# Patient Record
Sex: Male | Born: 1940 | Race: White | Hispanic: No | Smoking: Former smoker
Health system: Southern US, Community
[De-identification: ages and names within clinical notes are randomized; demographics above are authoritative.]

## PROBLEM LIST (undated history)

## (undated) DIAGNOSIS — G4700 Insomnia, unspecified: Secondary | ICD-10-CM

## (undated) DIAGNOSIS — J449 Chronic obstructive pulmonary disease, unspecified: Secondary | ICD-10-CM

## (undated) DIAGNOSIS — K219 Gastro-esophageal reflux disease without esophagitis: Secondary | ICD-10-CM

## (undated) DIAGNOSIS — Z720 Tobacco use: Secondary | ICD-10-CM

## (undated) DIAGNOSIS — E785 Hyperlipidemia, unspecified: Secondary | ICD-10-CM

## (undated) DIAGNOSIS — K635 Polyp of colon: Secondary | ICD-10-CM

## (undated) DIAGNOSIS — K631 Perforation of intestine (nontraumatic): Secondary | ICD-10-CM

## (undated) DIAGNOSIS — I951 Orthostatic hypotension: Secondary | ICD-10-CM

## (undated) DIAGNOSIS — R410 Disorientation, unspecified: Secondary | ICD-10-CM

## (undated) DIAGNOSIS — G20 Parkinson's disease: Secondary | ICD-10-CM

## (undated) DIAGNOSIS — J61 Pneumoconiosis due to asbestos and other mineral fibers: Secondary | ICD-10-CM

## (undated) HISTORY — DX: Disorientation, unspecified: R41.0

## (undated) HISTORY — DX: Tobacco use: Z72.0

## (undated) HISTORY — DX: Gastro-esophageal reflux disease without esophagitis: K21.9

## (undated) HISTORY — DX: Parkinson's disease: G20

## (undated) HISTORY — PX: COLONOSCOPY: SHX174

## (undated) HISTORY — PX: WRIST SURGERY: SHX841

## (undated) HISTORY — PX: DUPUYTREN CONTRACTURE RELEASE: SHX1478

## (undated) HISTORY — DX: Polyp of colon: K63.5

## (undated) HISTORY — DX: Chronic obstructive pulmonary disease, unspecified: J44.9

## (undated) HISTORY — PX: CATARACT EXTRACTION: SUR2

## (undated) HISTORY — DX: Hyperlipidemia, unspecified: E78.5

## (undated) HISTORY — DX: Pneumoconiosis due to asbestos and other mineral fibers: J61

## (undated) HISTORY — DX: Orthostatic hypotension: I95.1

---

## 1968-06-08 HISTORY — PX: NECK SURGERY: SHX720

## 1998-10-22 ENCOUNTER — Emergency Department (HOSPITAL_COMMUNITY): Admission: EM | Admit: 1998-10-22 | Discharge: 1998-10-22 | Payer: Self-pay | Admitting: Emergency Medicine

## 1998-10-22 ENCOUNTER — Encounter: Payer: Self-pay | Admitting: Emergency Medicine

## 1998-10-23 ENCOUNTER — Inpatient Hospital Stay (HOSPITAL_COMMUNITY): Admission: EM | Admit: 1998-10-23 | Discharge: 1998-10-25 | Payer: Self-pay | Admitting: Emergency Medicine

## 1998-10-23 ENCOUNTER — Encounter: Payer: Self-pay | Admitting: Internal Medicine

## 1998-10-23 ENCOUNTER — Encounter: Payer: Self-pay | Admitting: *Deleted

## 1998-11-08 ENCOUNTER — Encounter: Admission: RE | Admit: 1998-11-08 | Discharge: 1998-11-08 | Payer: Self-pay | Admitting: Internal Medicine

## 1998-12-24 ENCOUNTER — Encounter: Admission: RE | Admit: 1998-12-24 | Discharge: 1998-12-24 | Payer: Self-pay | Admitting: Internal Medicine

## 2001-07-01 ENCOUNTER — Encounter: Admission: RE | Admit: 2001-07-01 | Discharge: 2001-07-01 | Payer: Self-pay

## 2001-09-20 ENCOUNTER — Encounter: Admission: RE | Admit: 2001-09-20 | Discharge: 2001-09-20 | Payer: Self-pay

## 2002-03-03 ENCOUNTER — Encounter: Admission: RE | Admit: 2002-03-03 | Discharge: 2002-03-03 | Payer: Self-pay | Admitting: Internal Medicine

## 2002-05-01 ENCOUNTER — Encounter: Payer: Self-pay | Admitting: Internal Medicine

## 2002-05-02 ENCOUNTER — Ambulatory Visit (HOSPITAL_COMMUNITY): Admission: RE | Admit: 2002-05-02 | Discharge: 2002-05-02 | Payer: Self-pay | Admitting: Internal Medicine

## 2002-05-02 ENCOUNTER — Encounter: Payer: Self-pay | Admitting: Internal Medicine

## 2002-05-05 ENCOUNTER — Encounter (INDEPENDENT_AMBULATORY_CARE_PROVIDER_SITE_OTHER): Payer: Self-pay | Admitting: *Deleted

## 2002-05-05 ENCOUNTER — Inpatient Hospital Stay (HOSPITAL_COMMUNITY): Admission: RE | Admit: 2002-05-05 | Discharge: 2002-05-11 | Payer: Self-pay | Admitting: Internal Medicine

## 2002-05-05 ENCOUNTER — Encounter: Payer: Self-pay | Admitting: Internal Medicine

## 2002-05-09 ENCOUNTER — Encounter: Payer: Self-pay | Admitting: Internal Medicine

## 2002-05-11 ENCOUNTER — Encounter: Payer: Self-pay | Admitting: Internal Medicine

## 2002-05-11 ENCOUNTER — Encounter (INDEPENDENT_AMBULATORY_CARE_PROVIDER_SITE_OTHER): Payer: Self-pay | Admitting: *Deleted

## 2002-05-22 ENCOUNTER — Encounter: Payer: Self-pay | Admitting: Internal Medicine

## 2002-05-22 ENCOUNTER — Encounter (INDEPENDENT_AMBULATORY_CARE_PROVIDER_SITE_OTHER): Payer: Self-pay | Admitting: *Deleted

## 2002-05-22 ENCOUNTER — Ambulatory Visit (HOSPITAL_COMMUNITY): Admission: RE | Admit: 2002-05-22 | Discharge: 2002-05-22 | Payer: Self-pay | Admitting: Internal Medicine

## 2002-05-23 ENCOUNTER — Encounter: Admission: RE | Admit: 2002-05-23 | Discharge: 2002-05-23 | Payer: Self-pay | Admitting: Internal Medicine

## 2002-06-22 ENCOUNTER — Ambulatory Visit (HOSPITAL_COMMUNITY): Admission: RE | Admit: 2002-06-22 | Discharge: 2002-06-22 | Payer: Self-pay | Admitting: Internal Medicine

## 2002-06-22 ENCOUNTER — Encounter: Payer: Self-pay | Admitting: Internal Medicine

## 2002-08-22 ENCOUNTER — Encounter: Payer: Self-pay | Admitting: Internal Medicine

## 2002-08-22 ENCOUNTER — Ambulatory Visit (HOSPITAL_COMMUNITY): Admission: RE | Admit: 2002-08-22 | Discharge: 2002-08-22 | Payer: Self-pay | Admitting: Internal Medicine

## 2003-04-13 ENCOUNTER — Encounter: Payer: Self-pay | Admitting: Internal Medicine

## 2003-04-13 ENCOUNTER — Ambulatory Visit (HOSPITAL_COMMUNITY): Admission: RE | Admit: 2003-04-13 | Discharge: 2003-04-13 | Payer: Self-pay | Admitting: Internal Medicine

## 2003-04-25 ENCOUNTER — Encounter: Payer: Self-pay | Admitting: Internal Medicine

## 2004-05-05 ENCOUNTER — Ambulatory Visit: Payer: Self-pay | Admitting: Internal Medicine

## 2004-10-06 ENCOUNTER — Ambulatory Visit: Payer: Self-pay | Admitting: Internal Medicine

## 2004-10-15 ENCOUNTER — Ambulatory Visit: Payer: Self-pay | Admitting: Internal Medicine

## 2005-01-26 ENCOUNTER — Ambulatory Visit: Payer: Self-pay | Admitting: Internal Medicine

## 2005-01-29 ENCOUNTER — Emergency Department (HOSPITAL_COMMUNITY): Admission: EM | Admit: 2005-01-29 | Discharge: 2005-01-29 | Payer: Self-pay | Admitting: Emergency Medicine

## 2005-02-03 ENCOUNTER — Ambulatory Visit: Payer: Self-pay | Admitting: Internal Medicine

## 2005-05-20 ENCOUNTER — Ambulatory Visit: Payer: Self-pay | Admitting: Internal Medicine

## 2005-06-30 ENCOUNTER — Ambulatory Visit: Payer: Self-pay | Admitting: Internal Medicine

## 2005-07-03 ENCOUNTER — Ambulatory Visit: Payer: Self-pay | Admitting: Internal Medicine

## 2005-12-30 ENCOUNTER — Ambulatory Visit: Payer: Self-pay | Admitting: Hospitalist

## 2005-12-30 ENCOUNTER — Ambulatory Visit (HOSPITAL_COMMUNITY): Admission: RE | Admit: 2005-12-30 | Discharge: 2005-12-30 | Payer: Self-pay | Admitting: Hospitalist

## 2006-01-12 ENCOUNTER — Encounter (INDEPENDENT_AMBULATORY_CARE_PROVIDER_SITE_OTHER): Payer: Self-pay | Admitting: Internal Medicine

## 2006-01-15 ENCOUNTER — Ambulatory Visit: Payer: Self-pay | Admitting: Internal Medicine

## 2006-02-15 ENCOUNTER — Ambulatory Visit: Payer: Self-pay | Admitting: Internal Medicine

## 2006-03-03 ENCOUNTER — Encounter: Payer: Self-pay | Admitting: Internal Medicine

## 2006-03-03 ENCOUNTER — Ambulatory Visit (HOSPITAL_COMMUNITY): Admission: RE | Admit: 2006-03-03 | Discharge: 2006-03-03 | Payer: Self-pay | Admitting: Internal Medicine

## 2006-04-09 DIAGNOSIS — E785 Hyperlipidemia, unspecified: Secondary | ICD-10-CM | POA: Insufficient documentation

## 2006-04-09 DIAGNOSIS — K222 Esophageal obstruction: Secondary | ICD-10-CM

## 2006-04-09 DIAGNOSIS — D126 Benign neoplasm of colon, unspecified: Secondary | ICD-10-CM | POA: Insufficient documentation

## 2006-04-09 DIAGNOSIS — K219 Gastro-esophageal reflux disease without esophagitis: Secondary | ICD-10-CM

## 2006-04-09 DIAGNOSIS — Z87891 Personal history of nicotine dependence: Secondary | ICD-10-CM

## 2006-04-09 DIAGNOSIS — J61 Pneumoconiosis due to asbestos and other mineral fibers: Secondary | ICD-10-CM | POA: Insufficient documentation

## 2006-04-09 DIAGNOSIS — F1011 Alcohol abuse, in remission: Secondary | ICD-10-CM | POA: Insufficient documentation

## 2006-04-19 ENCOUNTER — Ambulatory Visit: Payer: Self-pay | Admitting: Internal Medicine

## 2006-04-19 ENCOUNTER — Encounter (INDEPENDENT_AMBULATORY_CARE_PROVIDER_SITE_OTHER): Payer: Self-pay | Admitting: Internal Medicine

## 2006-06-19 DIAGNOSIS — K631 Perforation of intestine (nontraumatic): Secondary | ICD-10-CM

## 2006-06-19 HISTORY — DX: Perforation of intestine (nontraumatic): K63.1

## 2006-08-25 ENCOUNTER — Ambulatory Visit: Payer: Self-pay | Admitting: Internal Medicine

## 2007-03-03 ENCOUNTER — Emergency Department (HOSPITAL_COMMUNITY): Admission: EM | Admit: 2007-03-03 | Discharge: 2007-03-03 | Payer: Self-pay | Admitting: Emergency Medicine

## 2007-05-16 ENCOUNTER — Ambulatory Visit: Payer: Self-pay | Admitting: Infectious Diseases

## 2007-12-26 ENCOUNTER — Encounter (INDEPENDENT_AMBULATORY_CARE_PROVIDER_SITE_OTHER): Payer: Self-pay | Admitting: Internal Medicine

## 2007-12-26 ENCOUNTER — Ambulatory Visit (HOSPITAL_COMMUNITY): Admission: RE | Admit: 2007-12-26 | Discharge: 2007-12-26 | Payer: Self-pay | Admitting: Internal Medicine

## 2007-12-26 ENCOUNTER — Ambulatory Visit: Payer: Self-pay | Admitting: Internal Medicine

## 2007-12-26 DIAGNOSIS — J029 Acute pharyngitis, unspecified: Secondary | ICD-10-CM

## 2007-12-26 LAB — CONVERTED CEMR LAB: Streptococcus, Group A Screen (Direct): NEGATIVE

## 2008-01-02 ENCOUNTER — Ambulatory Visit: Payer: Self-pay | Admitting: Internal Medicine

## 2008-01-26 ENCOUNTER — Ambulatory Visit: Payer: Self-pay | Admitting: *Deleted

## 2008-01-26 ENCOUNTER — Encounter: Payer: Self-pay | Admitting: Internal Medicine

## 2008-01-26 LAB — CONVERTED CEMR LAB
Cholesterol: 174 mg/dL (ref 0–200)
HDL: 49 mg/dL (ref >39)
LDL Cholesterol: 48 mg/dL (ref 0–99)
Total CHOL/HDL Ratio: 3.6
Triglycerides: 386 mg/dL — ABNORMAL HIGH (ref <150)
VLDL: 77 mg/dL — ABNORMAL HIGH (ref 0–40)

## 2008-02-06 ENCOUNTER — Telehealth: Payer: Self-pay | Admitting: Internal Medicine

## 2008-03-07 ENCOUNTER — Ambulatory Visit: Payer: Self-pay | Admitting: Internal Medicine

## 2008-04-18 ENCOUNTER — Encounter (INDEPENDENT_AMBULATORY_CARE_PROVIDER_SITE_OTHER): Payer: Self-pay | Admitting: Internal Medicine

## 2008-06-18 ENCOUNTER — Encounter: Payer: Self-pay | Admitting: Internal Medicine

## 2008-06-18 ENCOUNTER — Ambulatory Visit: Payer: Self-pay | Admitting: Internal Medicine

## 2008-06-18 DIAGNOSIS — M109 Gout, unspecified: Secondary | ICD-10-CM | POA: Insufficient documentation

## 2008-06-25 LAB — CONVERTED CEMR LAB
ALT: 17 units/L (ref 0–53)
AST: 19 units/L (ref 0–37)
Albumin: 4 g/dL (ref 3.5–5.2)
Alkaline Phosphatase: 69 units/L (ref 39–117)
BUN: 12 mg/dL (ref 6–23)
Basophils Absolute: 0 10*3/uL (ref 0.0–0.1)
Basophils Relative: 0 % (ref 0–1)
CO2: 24 meq/L (ref 19–32)
Calcium: 9.2 mg/dL (ref 8.4–10.5)
Chloride: 104 meq/L (ref 96–112)
Creatinine, Ser: 0.94 mg/dL (ref 0.40–1.50)
Crystals, Fluid: NONE SEEN
Eosinophils Absolute: 0.1 10*3/uL (ref 0.0–0.7)
Eosinophils Relative: 1 % (ref 0–5)
Glucose, Bld: 96 mg/dL (ref 70–99)
HCT: 48.8 % (ref 39.0–52.0)
Hemoglobin: 16.3 g/dL (ref 13.0–17.0)
Lymphocytes Relative: 20 % (ref 12–46)
Lymphs Abs: 1.7 10*3/uL (ref 0.7–4.0)
MCHC: 33.4 g/dL (ref 30.0–36.0)
MCV: 98.4 fL (ref 78.0–100.0)
Monocytes Absolute: 0.7 10*3/uL (ref 0.1–1.0)
Monocytes Relative: 9 % (ref 3–12)
Neutro Abs: 5.9 10*3/uL (ref 1.7–7.7)
Neutrophils Relative %: 70 % (ref 43–77)
Platelets: 222 10*3/uL (ref 150–400)
Potassium: 4.7 meq/L (ref 3.5–5.3)
RBC: 4.96 M/uL (ref 4.22–5.81)
RDW: 13.3 % (ref 11.5–15.5)
Sodium: 141 meq/L (ref 135–145)
Total Bilirubin: 0.6 mg/dL (ref 0.3–1.2)
Total Protein: 6.5 g/dL (ref 6.0–8.3)
WBC: 8.4 10*3/uL (ref 4.0–10.5)

## 2008-06-28 ENCOUNTER — Telehealth: Payer: Self-pay | Admitting: *Deleted

## 2008-06-29 ENCOUNTER — Ambulatory Visit: Payer: Self-pay | Admitting: Internal Medicine

## 2008-07-02 ENCOUNTER — Encounter: Payer: Self-pay | Admitting: Internal Medicine

## 2008-07-02 ENCOUNTER — Ambulatory Visit: Payer: Self-pay | Admitting: Infectious Disease

## 2008-07-03 LAB — CONVERTED CEMR LAB: Streptococcus, Group A Screen (Direct): NEGATIVE

## 2008-07-05 ENCOUNTER — Telehealth: Payer: Self-pay | Admitting: Internal Medicine

## 2008-08-16 ENCOUNTER — Ambulatory Visit: Payer: Self-pay | Admitting: Internal Medicine

## 2008-08-16 DIAGNOSIS — R21 Rash and other nonspecific skin eruption: Secondary | ICD-10-CM

## 2008-08-30 ENCOUNTER — Ambulatory Visit: Payer: Self-pay | Admitting: *Deleted

## 2008-08-30 ENCOUNTER — Encounter (INDEPENDENT_AMBULATORY_CARE_PROVIDER_SITE_OTHER): Payer: Self-pay | Admitting: Internal Medicine

## 2008-08-30 LAB — CONVERTED CEMR LAB
Cholesterol: 192 mg/dL (ref 0–200)
HDL: 59 mg/dL (ref >39)
LDL Cholesterol: 97 mg/dL (ref 0–99)
TSH: 1.512 microintl units/mL (ref 0.350–4.500)
Total CHOL/HDL Ratio: 3.3
Triglycerides: 179 mg/dL — ABNORMAL HIGH (ref <150)
VLDL: 36 mg/dL (ref 0–40)

## 2008-11-08 ENCOUNTER — Ambulatory Visit: Payer: Self-pay | Admitting: Internal Medicine

## 2008-11-27 ENCOUNTER — Encounter: Payer: Self-pay | Admitting: Internal Medicine

## 2008-11-27 ENCOUNTER — Ambulatory Visit: Payer: Self-pay | Admitting: Internal Medicine

## 2008-11-28 ENCOUNTER — Encounter: Payer: Self-pay | Admitting: Internal Medicine

## 2008-12-01 ENCOUNTER — Encounter: Payer: Self-pay | Admitting: Internal Medicine

## 2008-12-25 ENCOUNTER — Ambulatory Visit: Payer: Self-pay | Admitting: Internal Medicine

## 2009-06-21 ENCOUNTER — Ambulatory Visit: Payer: Self-pay | Admitting: Internal Medicine

## 2009-06-21 ENCOUNTER — Telehealth: Payer: Self-pay | Admitting: *Deleted

## 2009-06-21 LAB — CONVERTED CEMR LAB
ALT: 15 units/L (ref 0–53)
AST: 18 units/L (ref 0–37)
Albumin: 4.2 g/dL (ref 3.5–5.2)
Alkaline Phosphatase: 55 units/L (ref 39–117)
BUN: 12 mg/dL (ref 6–23)
CO2: 22 meq/L (ref 19–32)
Calcium: 9.4 mg/dL (ref 8.4–10.5)
Chloride: 102 meq/L (ref 96–112)
Cholesterol: 212 mg/dL — ABNORMAL HIGH (ref 0–200)
Creatinine, Ser: 0.81 mg/dL (ref 0.40–1.50)
Glucose, Bld: 85 mg/dL (ref 70–99)
HCT: 48.8 % (ref 39.0–52.0)
HDL: 52 mg/dL (ref >39)
Hemoglobin: 16.6 g/dL (ref 13.0–17.0)
LDL Cholesterol: 101 mg/dL — ABNORMAL HIGH (ref 0–99)
MCHC: 34 g/dL (ref 30.0–36.0)
MCV: 99 fL (ref >=78.0)
Platelets: 187 10*3/uL (ref 150–400)
Potassium: 4.5 meq/L (ref 3.5–5.3)
RBC: 4.93 M/uL (ref 4.22–5.81)
RDW: 13.1 % (ref 11.5–15.5)
Sodium: 141 meq/L (ref 135–145)
TSH: 1.498 microintl units/mL (ref 0.350–4.5)
Total Bilirubin: 0.6 mg/dL (ref 0.3–1.2)
Total CHOL/HDL Ratio: 4.1
Total Protein: 6.7 g/dL (ref 6.0–8.3)
Triglycerides: 294 mg/dL — ABNORMAL HIGH (ref <150)
VLDL: 59 mg/dL — ABNORMAL HIGH (ref 0–40)
WBC: 4.8 10*3/uL (ref 4.0–10.5)

## 2009-06-24 ENCOUNTER — Ambulatory Visit: Payer: Self-pay | Admitting: Internal Medicine

## 2009-06-24 DIAGNOSIS — T887XXA Unspecified adverse effect of drug or medicament, initial encounter: Secondary | ICD-10-CM | POA: Insufficient documentation

## 2009-06-24 DIAGNOSIS — R1310 Dysphagia, unspecified: Secondary | ICD-10-CM | POA: Insufficient documentation

## 2009-08-16 ENCOUNTER — Ambulatory Visit: Payer: Self-pay | Admitting: Internal Medicine

## 2009-08-16 LAB — CONVERTED CEMR LAB

## 2009-10-10 ENCOUNTER — Telehealth: Payer: Self-pay | Admitting: *Deleted

## 2009-10-10 ENCOUNTER — Encounter (INDEPENDENT_AMBULATORY_CARE_PROVIDER_SITE_OTHER): Payer: Self-pay | Admitting: *Deleted

## 2009-10-10 ENCOUNTER — Emergency Department (HOSPITAL_COMMUNITY): Admission: EM | Admit: 2009-10-10 | Discharge: 2009-10-10 | Payer: Self-pay | Admitting: Emergency Medicine

## 2009-10-24 ENCOUNTER — Ambulatory Visit: Payer: Self-pay | Admitting: Internal Medicine

## 2009-11-06 ENCOUNTER — Telehealth: Payer: Self-pay | Admitting: Internal Medicine

## 2009-11-20 ENCOUNTER — Ambulatory Visit: Payer: Self-pay | Admitting: Internal Medicine

## 2009-11-20 DIAGNOSIS — J449 Chronic obstructive pulmonary disease, unspecified: Secondary | ICD-10-CM

## 2009-11-20 DIAGNOSIS — R930 Abnormal findings on diagnostic imaging of skull and head, not elsewhere classified: Secondary | ICD-10-CM

## 2009-11-20 DIAGNOSIS — F5101 Primary insomnia: Secondary | ICD-10-CM

## 2009-11-21 LAB — CONVERTED CEMR LAB
Cholesterol: 212 mg/dL — ABNORMAL HIGH (ref 0–200)
HDL: 43 mg/dL (ref >39)
LDL Cholesterol: 145 mg/dL — ABNORMAL HIGH (ref 0–99)
Total CHOL/HDL Ratio: 4.9
Triglycerides: 119 mg/dL (ref <150)
VLDL: 24 mg/dL (ref 0–40)

## 2009-11-22 ENCOUNTER — Telehealth: Payer: Self-pay | Admitting: Internal Medicine

## 2009-11-22 ENCOUNTER — Ambulatory Visit: Payer: Self-pay | Admitting: Internal Medicine

## 2009-11-28 ENCOUNTER — Ambulatory Visit (HOSPITAL_COMMUNITY): Admission: RE | Admit: 2009-11-28 | Discharge: 2009-11-28 | Payer: Self-pay | Admitting: Internal Medicine

## 2009-11-28 ENCOUNTER — Encounter: Payer: Self-pay | Admitting: Internal Medicine

## 2010-01-08 ENCOUNTER — Telehealth: Payer: Self-pay | Admitting: *Deleted

## 2010-01-16 ENCOUNTER — Ambulatory Visit: Payer: Self-pay | Admitting: Internal Medicine

## 2010-02-05 ENCOUNTER — Ambulatory Visit: Payer: Self-pay | Admitting: Internal Medicine

## 2010-02-06 ENCOUNTER — Telehealth: Payer: Self-pay | Admitting: Internal Medicine

## 2010-02-18 ENCOUNTER — Encounter: Payer: Self-pay | Admitting: Internal Medicine

## 2010-05-21 ENCOUNTER — Encounter: Payer: Self-pay | Admitting: Internal Medicine

## 2010-05-21 ENCOUNTER — Ambulatory Visit: Payer: Self-pay | Admitting: Internal Medicine

## 2010-05-22 LAB — CONVERTED CEMR LAB
ALT: 20 units/L (ref 0–53)
AST: 22 units/L (ref 0–37)
Albumin: 4.3 g/dL (ref 3.5–5.2)
Alkaline Phosphatase: 80 units/L (ref 39–117)
BUN: 12 mg/dL (ref 6–23)
CO2: 26 meq/L (ref 19–32)
Calcium: 9.5 mg/dL (ref 8.4–10.5)
Chloride: 105 meq/L (ref 96–112)
Cholesterol: 141 mg/dL (ref 0–200)
Creatinine, Ser: 0.99 mg/dL (ref 0.40–1.50)
Glucose, Bld: 92 mg/dL (ref 70–99)
HDL: 37 mg/dL — ABNORMAL LOW (ref >39)
LDL Cholesterol: 79 mg/dL (ref 0–99)
Potassium: 4.7 meq/L (ref 3.5–5.3)
Sodium: 142 meq/L (ref 135–145)
Total Bilirubin: 0.8 mg/dL (ref 0.3–1.2)
Total CHOL/HDL Ratio: 3.8
Total Protein: 7 g/dL (ref 6.0–8.3)
Triglycerides: 124 mg/dL (ref <150)
VLDL: 25 mg/dL (ref 0–40)

## 2010-07-08 NOTE — Progress Notes (Signed)
Summary: phone/gg  Phone Note Call from Patient   Summary of Call: Pt states last night he choked and  vomited while eating rib eye steak. After that  he vomited  every 10 minutes  until bed time.  Up 2 time during night.  today every time he swollows saliva he vomits bubbly, clear fluid.  this is small amount. Today he has not eaten because he will vomit. Other wise only c/o is heartburn. Please advise.   Pt # [id number] Initial call taken by: Merrie Roof RN,  Oct 10, 2009 11:50 AM  Follow-up for Phone Call        Please advise patient to go to the ED now, and please notify ED triage; it sounds like he has an esophageal obstruction and he will likely need an EGD. Follow-up by: Margarito Liner MD,  Oct 10, 2009 12:12 PM  Additional Follow-up for Phone Call Additional follow up Details #1::        I called pt and he will go to ED now. I called triage nurse and informed him of the situation and asked for pt to be assessed on arrival. Additional Follow-up by: Merrie Roof RN,  Oct 10, 2009 12:13 PM

## 2010-07-08 NOTE — Progress Notes (Signed)
Summary: phone/gg  Phone Note Call from Patient   Caller: Patient Summary of Call: Pt called to inform us medicare will not cover LUNESTA 2 MG TAB.  Can you order something else to help him sleep. Pt # [id number] Initial call taken by: Merrie Roof RN,  February 06, 2010 4:44 PM  Follow-up for Phone Call        pt has been tried on multiple agents, will try amitriptyline. please inform the patient that this might take 1-2 weeks for it to work.  Follow-up by: Darnelle Maffucci MD,  February 10, 2010 1:01 PM  Additional Follow-up for Phone Call Additional follow up Details #1::        Pt has been informed and Patient  verbalizes understanding of these instructions.  Additional Follow-up by: Merrie Roof RN,  February 12, 2010 10:43 AM    New/Updated Medications: AMITRIPTYLINE HCL 25 MG TABS (AMITRIPTYLINE HCL) take 1-2 tablets by mouth at bedtime for insomnia. Prescriptions: AMITRIPTYLINE HCL 25 MG TABS (AMITRIPTYLINE HCL) take 1-2 tablets by mouth at bedtime for insomnia.  #60 x 0   Entered and Authorized by:   Darnelle Maffucci MD   Signed by:   Darnelle Maffucci MD on 02/10/2010   Method used:   Electronically to        [facility]. [phone]* (retail)       [address]       [place], [place]  [address]       Ph: [phone]       Fax: [phone]   RxID:   [id number]

## 2010-07-08 NOTE — Progress Notes (Signed)
Summary: reaction to pneumovax  Phone Note Call from Patient   Caller: Patient Summary of Call: Pt received pneumovax today.  Stated he went hope to take a nap and woke up with a swelling at the site.  Pt instructed to go to ER/Urgent Care over the weekend if swelling gets worse. Also spoke with Dr Cena Benton who agreed with plan and stated that pt can also take motrin.   Follow-up for Phone Call        F/U call made to pt this morning, he states there is some swelling from shoulder to elbow.  Pt would like md to take a look at it to make sure its ok.  Appt scheduled for today at 10am with Dr Theotis Barrio Follow-up by: Krystal Eaton [facility]),  June 24, 2009 9:30 AM

## 2010-07-08 NOTE — Progress Notes (Signed)
Summary: phone/gg  Phone Note Call from Patient   Caller: Patient Summary of Call: Pt called stating sleeping med is not working.  He also states  pravastatin is making him itch.  He is not getting any sleep. He would like to talk with you -- or would you prefer to have him come in for OV Pt # [phone] Initial call taken by: Merrie Roof RN,  January 08, 2010 12:13 PM  Follow-up for Phone Call        I have reviewed some of his information.  It is probably better for him to come in for a formal visit.    His 10 year risk is >15% and with and LDL of 145, he should be on a statin.  However, if he thinks the itching is from the pravastatin, he can stop it and see if it resolves.    As for his insomnia, this has been an ongoing problem and his Remus Loffler was increased last visit.  He also has a h/o alcohol abuse although reports having quit.  Therefore, I do not think switching to a benzo would be a good choice, at least not over the phone.  There are a few other options that could be considered but should be done in person. Follow-up by: Mariea Stable MD,  January 08, 2010 3:59 PM  Additional Follow-up for Phone Call Additional follow up Details #1::        appointment given for 8/17 with PCP pt informed Additional Follow-up by: Merrie Roof RN,  January 09, 2010 11:23 AM

## 2010-07-08 NOTE — Assessment & Plan Note (Signed)
Summary: ?reaction to vaccine/shoulder sore/swollen//kg   Vital Signs:  Patient profile:   70 year old male Height:      66 inches (167.64 cm) Weight:      129.7 pounds (58.95 kg) BMI:     21.01 Temp:     97.5 degrees F (36.39 degrees C) oral Pulse rate:   75 / minute BP sitting:   107 / 75  (right arm)  Vitals Entered By: Stanton Kidney Ditzler RN (June 24, 2009 9:58 AM) CC: Headache Is Patient Diabetic? No Pain Assessment Patient in pain? no      Nutritional Status BMI of 19 -24 = normal Nutritional Status Detail appetite good  Have you ever been in a relationship where you felt threatened, hurt or afraid?denies   Does patient need assistance? Functional Status Self care Ambulation Normal Comments Ck upper left arm - had Pneumonia injection 06/21/09 - red and swollen. Better today.   Primary Care Provider:  Chauncey Reading, MD  CC:  Headache.  History of Present Illness: Pt is here for an acute visit after having a reaction to his peumococcal vaccie on Friday.  Pt reports that he had swelling redness and pain in hisleft shoulder 2 hrs after getting his pneumococcus injection.  He took benadryl, ibuprofen and heat applied locally which helped to resolve the symptoms.  He has some residual redness but most symptoms have resolved.  He denies any fevers or chills. Pt reports that when he eats too fast he has some difficulty swallowing.  This is mostly when he eats things like steak and eats too fast. He has no coughing associated with this.  This symptom is stable and does not involve liquids or soft foods.  Denies any weight loss.  Depression History:      The patient denies a depressed mood most of the day and a diminished interest in his usual daily activities.         Preventive Screening-Counseling & Management  Alcohol-Tobacco     Alcohol drinks/day: 4     Alcohol type: beer     Alcohol Counseling: to decrease amount and/or frequency of alcohol intake     Smoking Status:  quit     Year Quit: 7years ago     Passive Smoke Exposure: no  Caffeine-Diet-Exercise     Does Patient Exercise: yes  Current Medications (verified): 1)  Multivitamins  Tabs (Multiple Vitamin) .... Take 1 Tablet By Mouth Once A Day 2)  Naprosyn 250 Mg Tabs (Naproxen) .... Take 3 Tablets By Mouth When Gout Attack Starts, Then 1 Tablet Every 8 Hours Until Attack Subsides-Take As Needed  Allergies (verified): No Known Drug Allergies  Review of Systems  The patient denies anorexia, fever, weight loss, weight gain, chest pain, dyspnea on exertion, prolonged cough, and abdominal pain.    Physical Exam  General:  alert, well-developed, well-nourished, and well-hydrated.   Head:  normocephalic and atraumatic.   Eyes:  vision grossly intact, pupils equal, and pupils round.   Mouth:  no gingival abnormalities and pharynx pink and moist.   Neck:  supple.   Lungs:  normal respiratory effort, no crackles, and no wheezes.   Heart:  normal rate, regular rhythm, no murmur, no gallop, no rub, and no JVD.   Msk:  Left shoulder and upper arm mildly erythematous, mildly ttp on shoulder, no edema, o fluctuace. Neurologic:  cranial nerves II-XII intact.     Impression & Recommendations:  Problem # 1:  ADVERSE DRUG REACTION (ICD-995.20) Pt  reports tha after getting his pneumovax he had local erythema, pain, and swelling.  This symptoms have mostly resolved with anti-histamines, NSAIDs, ad local heat. Pt advised that local reactions are common ad without any systemic signs, nothing to be concerned about.  He was told that this may or may ot recur with subsequent boosters.  Problem # 2:  DYSPHAGIA UNSPECIFIED (ICD-787.20) Pt reports some occasional difficulty swallowing when he tries to eat too fast, usually associated with eating steak.  As this symptoms are very rare and not progressive, he was told that this was not likely a serious problem, advised to eat slowly, and told to call if these symptoms  become progressive or if he has an unexplained weightloss.  Complete Medication List: 1)  Multivitamins Tabs (Multiple vitamin) .... Take 1 tablet by mouth once a day 2)  Naprosyn 250 Mg Tabs (Naproxen) .... Take 3 tablets by mouth when gout attack starts, then 1 tablet every 8 hours until attack subsides-take as needed   Patient Instructions: 1)  Keep treating your arm as you have been with heat, ibuprofen and benadryl.  I do not think this is a serious reaction and it should cotiue to feel better.  2)  Schedule a follow up as needed.   3)  If your swallowing symptoms worsen give [place] a call.

## 2010-07-08 NOTE — Letter (Signed)
Summary: Patient [facility] Biopsy Results  [facility]  [address] [place], [place] [address]   Phone: [phone]  Fax: [phone]        November 28, 2009 MRN: [id number]    [facility] [address] [place], [place]  [address]    Dear [name],  I am pleased to inform you that the biopsies taken during your recent endoscopic examination did not show any evidence of cancer upon pathologic examination.  Additional information/recommendations:  __No further action is needed at this time.  Please follow-up with      your primary care physician for your other healthcare needs.  __ Please call [phone] to schedule a return visit to review      your condition.  _x_ Continue with the treatment plan as outlined on the day of your      exam.     Please call us if you are having persistent problems or have questions about your condition that have not been fully answered at this time.  Sincerely,  Hart Carwin MD  This letter has been electronically signed by your physician.  Appended Document: Patient Notice-Endo Biopsy Results letter mailed.

## 2010-07-08 NOTE — Progress Notes (Signed)
Summary: refill/ hla  Phone Note Refill Request Message from:  Fax from Pharmacy on November 06, 2009 12:08 PM  Refills Requested: Medication #1:  TRICOR 48 MG TABS Take 1 tablet by mouth once a day   Dosage confirmed as above?Dosage Confirmed   Last Refilled: 10/08/2009 Initial call taken by: Marin Roberts RN,  November 06, 2009 12:08 PM  Follow-up for Phone Call        Rx faxed to pharmacy Follow-up by: Mariea Stable MD,  November 06, 2009 12:36 PM    Prescriptions: TRICOR 48 MG TABS (FENOFIBRATE) Take 1 tablet by mouth once a day  #30 x 3   Entered and Authorized by:   Mariea Stable MD   Signed by:   Mariea Stable MD on 11/06/2009   Method used:   Electronically to        [facility]. [phone]* (retail)       [address]       [place], [place]  [address]       Ph: [phone]       Fax: [phone]   RxID:   [id number]

## 2010-07-08 NOTE — Assessment & Plan Note (Signed)
Summary: review meds/gg   Vital Signs:  Patient profile:   70 year old male Height:      66 inches (167.64 cm) Weight:      132.4 pounds (60.18 kg) BMI:     21.45 Temp:     97.2 degrees F (36.22 degrees C) oral Pulse rate:   61 / minute BP sitting:   129 / 71  (right arm)  Vitals Entered By: Stanton Kidney Ditzler RN (February 05, 2010 1:36 PM) CC: insomnia Is Patient Diabetic? No Pain Assessment Patient in pain? no      Nutritional Status BMI of 19 -24 = normal Nutritional Status Detail appetite good  Have you ever been in a relationship where you felt threatened, hurt or afraid?denies   Does patient need assistance? Functional Status Self care Ambulation Normal Comments Still not sleeping.   Primary Care Peregrine Nolt:  Mariea Stable MD  CC:  insomnia.  History of Present Illness: 70 yo male with PMH of Asbestosis, Tubulovillous adenoma, intertinal perforation during colonoscopy, hx of alcohol dependence x40 yrs quit 10/10/09, hx of tobacco abuse (quit), gout, insomnia presents today for difficulty sleeping.  Patient states that Dr Juanda Chance gave him ambien 5mg  and it helped him sleeping.  He then saw Dr. Onalee Hua who prescribed Pravastatin and that affected his sleep.  Dr. Onalee Hua increased his Ambien to 10mg  and it heped in the beginning but now it seems not to work as well. He reports doing strange things that he does not remember after he woke up.  He only gets about 4 hours of sleep per night, goes to sleep around 1am.  He used to drink caffeinated tea but now try decaf but still cannot sleep. he would like an alternative medication today.   Patient is feeling well and denies CP, abdominal pain, nausea, vomiting, HA's, palpitations, blurred vision. fever, chills, diarrhea, constipation or SOB.     Depression History:      The patient denies a depressed mood most of the day and a diminished interest in his usual daily activities.         Preventive Screening-Counseling &  Management  Alcohol-Tobacco     Alcohol drinks/day: 4     Alcohol type: beer     Alcohol Counseling: to decrease amount and/or frequency of alcohol intake     Smoking Status: quit     Year Quit: 7years ago     Passive Smoke Exposure: no  Caffeine-Diet-Exercise     Does Patient Exercise: yes  Current Medications (verified): 1)  Multivitamins  Tabs (Multiple Vitamin) .... Take 1 Tablet By Mouth Once A Day 2)  Pravachol 40 Mg Tabs (Pravastatin Sodium) .... Take 1 Tab By Mouth At Bedtime 3)  Lunesta 2 Mg Tabs (Eszopiclone) .... Take 1 Tablet By Mouth Once A Day At Bedtime.  Allergies (verified): No Known Drug Allergies  Review of Systems       as per HPI  Physical Exam  General:  alert, well-developed, well-nourished, and well-hydrated.   Lungs:  normal respiratory effort, no intercostal retractions, no accessory muscle use, normal breath sounds, no dullness, no fremitus, no crackles, and no wheezes.   Heart:  normal rate, regular rhythm, no murmur, no gallop, no rub, and no JVD.   Abdomen:  soft, non-tender, normal bowel sounds, no distention, no masses, and no guarding.   Extremities:  No edema   Impression & Recommendations:  Problem # 1:  INSOMNIA (ICD-780.52) i have changed restoril for a trial of lunesta.  The following medications were removed from the medication list:    Restoril 7.5 Mg Caps (Temazepam) .[name]... 1/2 to 1 tablet by mouth at bedtime His updated medication list for this problem includes:    Lunesta 2 Mg Tabs (Eszopiclone) .[name]... Take 1 tablet by mouth once a day at bedtime.  Problem # 2:  HYPERLIPIDEMIA (ICD-272.4) given that stopping statin has not made any difference, it is probable that statin was not the cause of his problem, i advised the patient to restart pravastatin.   His updated medication list for this problem includes:    Pravachol 40 Mg Tabs (Pravastatin sodium) .[name]... Take 1 tab by mouth at bedtime  Complete Medication List: 1)   Multivitamins Tabs (Multiple vitamin) .... Take 1 tablet by mouth once a day 2)  Pravachol 40 Mg Tabs (Pravastatin sodium) .... Take 1 tab by mouth at bedtime 3)  Lunesta 2 Mg Tabs (Eszopiclone) .... Take 1 tablet by mouth once a day at bedtime.  Patient Instructions: 1)  Please schedule a follow-up appointment in 3 months. Prescriptions: LUNESTA 2 MG TABS (ESZOPICLONE) Take 1 tablet by mouth once a day at bedtime.  #30 x 0   Entered and Authorized by:   Darnelle Maffucci MD   Signed by:   Darnelle Maffucci MD on 02/05/2010   Method used:   Print then Give to Patient   RxID:   [id number]    Prevention & Chronic Care Immunizations   Influenza vaccine: Historical  (03/08/2009)   Influenza vaccine deferral: Refused  (02/05/2010)    Tetanus booster: Not documented   Td booster deferral: Deferred  (08/16/2009)    Pneumococcal vaccine: Pneumovax (Medicare)  (06/21/2009)    H. zoster vaccine: Not documented   H. zoster vaccine deferral: Not available  (11/20/2009)  Colorectal Screening   Hemoccult: Not documented    Colonoscopy: Location:  [facility].    (11/27/2008)   Colonoscopy due: 12/2011  Other Screening   PSA: Not documented   PSA action/deferral: Discussed-decision deferred  (08/16/2009)   Smoking status: quit  (02/05/2010)  Lipids   Total Cholesterol: 212  (11/20/2009)   Lipid panel action/deferral: Lipid Panel ordered   LDL: 145  (11/20/2009)   LDL Direct: Not documented   HDL: 43  (11/20/2009)   Triglycerides: 119  (11/20/2009)    SGOT (AST): 18  (06/21/2009)   BMP action: Ordered   SGPT (ALT): 15  (06/21/2009)   Alkaline phosphatase: 55  (06/21/2009)   Total bilirubin: 0.6  (06/21/2009)    Lipid flowsheet reviewed?: Yes   Progress toward LDL goal: At goal  Self-Management Support :   Personal Goals (by the next clinic visit) :      Personal LDL goal: 130  (06/21/2009)    Patient will work on the following items until the next clinic  visit to reach self-care goals:     Medications and monitoring: take my medicines every day, check my blood pressure, bring all of my medications to every visit, weigh myself weekly  (02/05/2010)     Eating: eat more vegetables, use fresh or frozen vegetables, eat foods that are low in salt, eat fruit for snacks and desserts, limit or avoid alcohol  (02/05/2010)     Activity: take a 30 minute walk every day  (02/05/2010)     Other: RIDES [employer]KE  (06/21/2009)    Lipid self-management support: Written self-care plan, Education handout, Resources for patients handout  (02/05/2010)   Lipid self-care plan printed.   Lipid  education Geologist, engineering.

## 2010-07-08 NOTE — Assessment & Plan Note (Signed)
Summary: acute-bp check/meds/cfb(alvarez)   Vital Signs:  Patient profile:   70 year old male Height:      66 inches (167.64 cm) Weight:      128.7 pounds (58.50 kg) BMI:     20.85 Temp:     96.7 degrees F (35.94 degrees C) oral Pulse rate:   73 / minute BP sitting:   127 / 79  (right arm)  Vitals Entered By: Filomena Jungling NT II (June 21, 2009 10:02 AM) CC: GENERAL CHECK-UP Is Patient Diabetic? No Pain Assessment Patient in pain? no      Nutritional Status BMI of 19 -24 = normal  Does patient need assistance? Functional Status Self care Ambulation Normal   Primary Care Provider:  Chauncey Reading, MD  CC:  GENERAL CHECK-UP.  History of Present Illness: 70 yr old man with pmhx as described below comes to the clinic for routine check up. Patient had been going through some stress and secondary to that he thought his blood pressure was elevated. He has no complains.  Last episode of gout was march of last year.   Preventive Screening-Counseling & Management  Alcohol-Tobacco     Alcohol drinks/day: 4     Alcohol type: beer     Alcohol Counseling: to decrease amount and/or frequency of alcohol intake     Smoking Status: quit     Year Quit: 7years ago     Passive Smoke Exposure: no  Caffeine-Diet-Exercise     Does Patient Exercise: yes  Problems Prior to Update: 1)  Dysphagia Unspecified  (ICD-787.20) 2)  Adverse Drug Reaction  (ICD-995.20) 3)  Skin Rash  (ICD-782.1) 4)  Gout, Acute  (ICD-274.9) 5)  Sore Throat  (ICD-462) 6)  Hx of Perforation, Intestine  (ICD-569.83) 7)  Dependence, Alcohol Nec/nos, Continuous  (ICD-303.91) 8)  Hx of Tobacco Abuse  (ICD-305.1) 9)  Tubulovillous Adenoma, Colon, Hx of  (ICD-V12.72) 10)  Gastroesophageal Reflux Disease  (ICD-530.81) 11)  Asbestosis  (ICD-501) 12)  Hyperlipidemia  (ICD-272.4)  Medications Prior to Update: 1)  Multivitamins  Tabs (Multiple Vitamin) .... Take 1 Tablet By Mouth Once A Day 2)  Naprosyn 250 Mg Tabs  (Naproxen) .... Take 3 Tablets By Mouth When Gout Attack Starts, Then 1 Tablet Every 8 Hours Until Attack Subsides-Take As Needed  Current Medications (verified): 1)  Multivitamins  Tabs (Multiple Vitamin) .... Take 1 Tablet By Mouth Once A Day 2)  Naprosyn 250 Mg Tabs (Naproxen) .... Take 3 Tablets By Mouth When Gout Attack Starts, Then 1 Tablet Every 8 Hours Until Attack Subsides-Take As Needed  Allergies: No Known Drug Allergies  Past History:  Past Medical History: Last updated: 06/29/2008 Hyperlipidemia Asbestosis GERD Tubulovillous adenomatous colonic polyp Hx of colonic perforation, during colonoscopy Tobacco abuse, hx of, quit 2001. Alcohol abuse Gout, diagnosed by ortho, July 2009      -inadequate aspiration of MTP joint left great toe  06/18/08           -fluid:  red, bloody, cell count not performed (TNP) and no crystals or organisms seen.  Past Surgical History: Last updated: 28-Nov-2008 colonoscopy. Left wrist surgery. Surgery for Dupeytrens contracture 5 yrs ago. cyst removed from neck  Family History: Last updated: 28-Nov-2008 Mom- Died.History of aneurysms in brain. Dad- Died. History of  mesothelioma. Sister- HTN. Family History of Heart Disease: Mother No FH of Colon Cancer:  Social History: Last updated: 2008-11-28 Lives with wife who has medical problems that are causing him stress. Retired.  Exposed to  asbestos at work. Married Former Smoker-stopped 10 years ago Alcohol use-yes-4 beers per day Drug use-no Regular exercise-yes  Risk Factors: Alcohol Use: 4 (06/21/2009) Exercise: yes (06/21/2009)  Risk Factors: Smoking Status: quit (06/21/2009) Passive Smoke Exposure: no (06/21/2009)  Family History: Reviewed history from 11/08/2008 and no changes required. Mom- Died.History of aneurysms in brain. Dad- Died. History of  mesothelioma. Sister- HTN. Family History of Heart Disease: Mother No FH of Colon Cancer:  Social History: Reviewed  history from 11/08/2008 and no changes required. Lives with wife who has medical problems that are causing him stress. Retired.  Exposed to asbestos at work. Married Former Smoker-stopped 10 years ago Alcohol use-yes-4 beers per day Drug use-no Regular exercise-yes  Review of Systems  The patient denies fever, decreased hearing, chest pain, dyspnea on exertion, peripheral edema, prolonged cough, headaches, hemoptysis, abdominal pain, melena, hematochezia, hematuria, muscle weakness, transient blindness, difficulty walking, unusual weight change, and testicular masses.    Physical Exam  General:  alert, oriented and in no distress.  Head:  normocephalic.   Eyes:  vision grossly intact.   Ears:  no external deformities.   Nose:  no external deformity, no external erythema, and no nasal discharge.   Mouth:  no lesions. Neck:  Supple; no masses or thyromegaly. Lungs:  Clear throughout to auscultation. Heart:  Regular rate and rhythm; no murmurs, rubs,  or bruits. Abdomen:  soft abdomen with normal active bowel sounds. Ventral hernia when sitting up. No tenderness. Liver edge at costal margin . Msk:  normal ROM.   Pulses:  good DP and PT pulses Extremities:  No clubbing, cyanosis, edema or deformities noted. Neurologic:  Alert and oriented x4; grossly normal neurologically.   Impression & Recommendations:  Problem # 1:  HYPERLIPIDEMIA (ICD-272.4) FLP reviewed. LDL at goal. TG elevated. Will have patient start taking Tricor. Recheck FLP in 3 months.  Orders: T-Lipid Profile ([id number])  His updated medication list for this problem includes:    Tricor 48 Mg Tabs (Fenofibrate) .[name]... Take 1 tablet by mouth once a day  Problem # 2:  TUBULOVILLOUS ADENOMA, COLON, HX OF (ICD-V12.72) Recheck Colonoscopy in June 2013 but would referr to have it done sooner if patient becomes symptomatic due to incomplete resection of polyp.  Problem # 3:  GOUT, ACUTE (ICD-274.9) Stable.   Problem  # 4:  Hx of TOBACCO ABUSE (ICD-305.1) Encouraged abstinence.  Problem # 5:  Preventive Health Care (ICD-V70.0) Pnemovax given today.  Problem # 6:  GASTROESOPHAGEAL REFLUX DISEASE (ICD-530.81) Stable.   Labs Reviewed: Hgb: 16.3 (06/18/2008)   Hct: 48.8 (06/18/2008)  Complete Medication List: 1)  Multivitamins Tabs (Multiple vitamin) .... Take 1 tablet by mouth once a day 2)  Naprosyn 250 Mg Tabs (Naproxen) .... Take 3 tablets by mouth when gout attack starts, then 1 tablet every 8 hours until attack subsides-take as needed 3)  Tricor 48 Mg Tabs (Fenofibrate) .... Take 1 tablet by mouth once a day  Other Orders: Pneumococcal Vaccine ([address]) Admin 1st Vaccine (13244) T-Comprehensive Metabolic Panel [phone]) T-CBC No Diff ([id number]) T-TSH ([id number])  Patient Instructions: 1)  Please schedule a follow-up appointment as needed. 2)  You will be called with any abnormalities in the tests scheduled or performed today.  If you don't hear from [place] within a week from when the test was performed, you can assume that your test was normal.  3)  It is not healthy  for men to drink more than 2-3 drinks per day or for women to  drink more than 1-2 drinks per day. Prescriptions: TRICOR 48 MG TABS (FENOFIBRATE) Take 1 tablet by mouth once a day  #30 x 3   Entered and Authorized by:   Laren Everts MD   Signed by:   Laren Everts MD on 06/26/2009   Method used:   Electronically to        [facility]. [phone]* (retail)       [address]       [place], [place]  [address]       Ph: [phone]       Fax: [phone]   RxID:   [id number]   Prevention & Chronic Care Immunizations   Influenza vaccine: Fluvax 3+  (03/07/2008)    Tetanus booster: Not documented    Pneumococcal vaccine: Pneumovax (Medicare)  (06/21/2009)    H. zoster vaccine: Not documented  Colorectal Screening   Hemoccult: Not documented     Colonoscopy: Location:  [facility].    (11/27/2008)   Colonoscopy due: 12/2011  Other Screening   PSA: Not documented   Smoking status: quit  (06/21/2009)  Lipids   Total Cholesterol: 192  (08/30/2008)   Lipid panel action/deferral: Lipid Panel ordered   LDL: 97  (08/30/2008)   LDL Direct: Not documented   HDL: 59  (08/30/2008)   Triglycerides: 179  (08/30/2008)    SGOT (AST): 19  (06/18/2008)   BMP action: Ordered   SGPT (ALT): 17  (06/18/2008) CMP ordered    Alkaline phosphatase: 69  (06/18/2008)   Total bilirubin: 0.6  (06/18/2008)    Lipid flowsheet reviewed?: Yes   Progress toward LDL goal: Unchanged  Self-Management Support :   Personal Goals (by the next clinic visit) :      Personal LDL goal: 130  (06/21/2009)    Patient will work on the following items until the next clinic visit to reach self-care goals:     Eating: eat more vegetables, use fresh or frozen vegetables, eat foods that are low in salt, eat baked foods instead of fried foods, eat fruit for snacks and desserts  (06/21/2009)     Other: RIDES BIKE  (06/21/2009)    Lipid self-management support: Written self-care plan  (06/21/2009)   Lipid self-care plan printed.   Nursing Instructions: Give Pneumovax today    Process Orders Check Orders Results:     [facility]: Check successful Tests Sent for requisitioning (June 26, 2009 3:17 PM):     06/21/2009: [facility] -- T-Lipid Profile [id number] (signed)     06/21/2009: [facility] -- T-Comprehensive Metabolic Panel [[id number]] (signed)     06/21/2009: [facility] -- T-CBC No Diff [[id number]] (signed)     06/21/2009: [facility] -- T-TSH [phone] (signed)      Immunizations Administered:  Pneumonia Vaccine:    Vaccine Type: Pneumovax (Medicare)    Site: left deltoid    Mfr: Merck    Dose: 0.5 ml    Route: IM    Given by: Krystal Eaton (AAMA)    Exp. Date: 07/05/2010    Lot #: [id number]    VIS given: 01/04/96 version given June 21, 2009.

## 2010-07-08 NOTE — Progress Notes (Signed)
Summary: Medication   Phone Note Call from Patient   Caller: Daughter Summary of Call: Call to pt.  Spoke with pt's daughter informed her of the chhange from Tricor to Pravachol and hat the prescription has ben sent to the pt's pharmac.  Daughter said that [facility] now and will inform him of the change in his medication.  daughter voiced understanding of plan to change pt's medication. Angelina Ok RN  November 22, 2009 12:05 PM.   Follow-up for Phone Call        thanks Follow-up by: Mariea Stable MD,  December 02, 2009 9:31 AM

## 2010-07-08 NOTE — Assessment & Plan Note (Signed)
Summary: f/u tricor/pcp-[name]/hla   Vital Signs:  Patient profile:   70 year old male Height:      66 inches Weight:      126.4 pounds BMI:     20.48 Pulse rate:   74 / minute BP sitting:   143 / 87  (right arm)  Vitals Entered By: Filomena Jungling NT II (August 16, 2009 3:39 PM) CC: follow-up visit Is Patient Diabetic? No Pain Assessment Patient in pain? no      Nutritional Status BMI of 19 -24 = normal  Have you ever been in a relationship where you felt threatened, hurt or afraid?No   Does patient need assistance? Functional Status Self care Ambulation Normal   Immunization History:  Influenza Immunization History:    Influenza:  historical (03/08/2009)   Primary Care Provider:  Chauncey Reading, MD  CC:  follow-up visit.  History of Present Illness: [name] is a 70 yo man with PMH as outlined below.  He is here because of concerns of taking tricor with alcohol.  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  He reports drinking 3-4 beers per night.  He has not had any side effects but read on the computer that the combination can increase TG.    As for the alcohol, CAGE is negative and pt states he just enjoys drinking.  Denies any withdrwal symptoms in past.   Preventive Screening-Counseling & Management  Alcohol-Tobacco     Alcohol drinks/day: 4     Alcohol type: beer     Alcohol Counseling: to decrease amount and/or frequency of alcohol intake     Smoking Status: quit     Year Quit: 7years ago     Passive Smoke Exposure: no  Caffeine-Diet-Exercise     Does Patient Exercise: yes  Current Medications (verified): 1)  Multivitamins  Tabs (Multiple Vitamin) .... Take 1 Tablet By Mouth Once A Day 2)  Naprosyn 250 Mg Tabs (Naproxen) .... Take 3 Tablets By Mouth When Gout Attack Starts, Then 1 Tablet Every 8 Hours Until Attack Subsides-Take As Needed 3)  Tricor 48 Mg Tabs (Fenofibrate) .... Take 1 Tablet By Mouth Once A Day  Allergies (verified): No  Known Drug Allergies  Past History:  Past Medical History: Last updated: 06/29/2008 Hyperlipidemia Asbestosis GERD Tubulovillous adenomatous colonic polyp Hx of colonic perforation, during colonoscopy Tobacco abuse, hx of, quit 2001. Alcohol abuse Gout, diagnosed by ortho, July 2009      -inadequate aspiration of MTP joint left great toe  06/18/08           -fluid:  red, bloody, cell count not performed (TNP) and no crystals or organisms seen.  Past Surgical History: Last updated: 11/08/2008 colonoscopy. Left wrist surgery. Surgery for Dupeytrens contracture 5 yrs ago. cyst removed from neck  Social History: Last updated: 11/08/2008 Lives with wife who has medical problems that are causing him stress. Retired.  Exposed to asbestos at work. Married Former Smoker-stopped 10 years ago Alcohol use-yes-4 beers per day Drug use-no Regular exercise-yes  Risk Factors: Smoking Status: quit (08/16/2009) Passive Smoke Exposure: no (08/16/2009)  Review of Systems      See HPI  Physical Exam  General:  alert, well-developed, well-nourished, and well-hydrated.   Eyes:  vision grossly intact, pupils equal, and pupils round.   Neck:  supple, no thyromegaly, no JVD, and no carotid bruits.   Lungs:  normal respiratory effort, no accessory muscle use, normal breath sounds, no crackles, and no wheezes.  Heart:  normal rate, regular rhythm, no murmur, no gallop, no rub, and no JVD.   Abdomen:  soft, non-tender, normal bowel sounds, and no distention.   Extremities:  no edema Neurologic:  alert & oriented X3, cranial nerves II-XII intact, strength normal in all extremities, and gait normal.   Psych:  Oriented X3, memory intact for recent and remote, normally interactive, good eye contact, and not depressed appearing.  slightly anxious   Impression & Recommendations:  Problem # 1:  HYPERLIPIDEMIA (ICD-272.4)  HyperTGemia in past Tricor can affect LFTs as can alcohol and  combination may be additive.  This was discussed with pt.  Also reviewed information on tricor and discussed with Dr. Aundria Rud.  No specific contraindication found.  Advised pt to drink in moderation and of potential increased hepatic toxicity using combination.  Monitor LFTs   His updated medication list for this problem includes:    Tricor 48 Mg Tabs (Fenofibrate) .[name]... Take 1 tablet by mouth once a day  Labs Reviewed: SGOT: 18 (06/21/2009)   SGPT: 15 (06/21/2009)   HDL:52 (06/21/2009), 59 (08/30/2008)  LDL:101 (06/21/2009), 97 (04/54/0981)  Chol:212 (06/21/2009), 192 (08/30/2008)  Trig:294 (06/21/2009), 179 (08/30/2008)  Problem # 2:  TUBULOVILLOUS ADENOMA, COLON, HX OF (ICD-V12.72) colonoscopy 11/2008 w/ tubovillous adenoma partailly removed. Dr. Juanda Chance discussed option of laparoscopic resection given possibility of progression to CA.  Given pt's prior complication with polypectomy, he decided to monitor with repeat colonoscopy in 3 years.  Problem # 3:  Preventive Health Care (ICD-V70.0) Flu recieved last fall at outside facility..... updated flowsheet Pneumovax given recently Discussed PSA, pt has seen urologist in [place] in the past.  Deferred checking, will try to obtain records from urology.   Complete Medication List: 1)  Multivitamins Tabs (Multiple vitamin) .... Take 1 tablet by mouth once a day 2)  Naprosyn 250 Mg Tabs (Naproxen) .... Take 3 tablets by mouth when gout attack starts, then 1 tablet every 8 hours until attack subsides-take as needed 3)  Tricor 48 Mg Tabs (Fenofibrate) .... Take 1 tablet by mouth once a day  Patient Instructions: 1)  Please schedule a follow-up appointment in 3 months. 2)  Continue your medications listed below. 3)  Remember that tricor can affect your liver function as can alcohol, and the two in combination may have an addative effect (be worse on the liver than either by itself).  Given that, keep alcohol consumption in moderation.  4)  If  you have any problems before your next visit, call clinic.   Prevention & Chronic Care Immunizations   Influenza vaccine: Historical  (03/08/2009)    Tetanus booster: Not documented   Td booster deferral: Deferred  (08/16/2009)    Pneumococcal vaccine: Pneumovax (Medicare)  (06/21/2009)    H. zoster vaccine: Not documented  Colorectal Screening   Hemoccult: Not documented    Colonoscopy: Location:  [facility].    (11/27/2008)   Colonoscopy due: 12/2011  Other Screening   PSA: Not documented   PSA action/deferral: Discussed-decision deferred  (08/16/2009)   Smoking status: quit  (08/16/2009)  Lipids   Total Cholesterol: 212  (06/21/2009)   Lipid panel action/deferral: Lipid Panel ordered   LDL: 101  (06/21/2009)   LDL Direct: Not documented   HDL: 52  (06/21/2009)   Triglycerides: 294  (06/21/2009)    SGOT (AST): 18  (06/21/2009)   BMP action: Ordered   SGPT (ALT): 15  (06/21/2009)   Alkaline phosphatase: 55  (06/21/2009)   Total bilirubin:  0.6  (06/21/2009)    Lipid flowsheet reviewed?: Yes   Progress toward LDL goal: Unchanged  Self-Management Support :   Personal Goals (by the next clinic visit) :      Personal LDL goal: 130  (06/21/2009)    Patient will work on the following items until the next clinic visit to reach self-care goals:     Eating: drink diet soda or water instead of juice or soda, eat more vegetables, use fresh or frozen vegetables, eat foods that are low in salt, eat baked foods instead of fried foods, eat fruit for snacks and desserts  (08/16/2009)     Other: RIDES BIKE  (06/21/2009)    Lipid self-management support: Education handout, Resources for patients handout, Written self-care plan  (08/16/2009)   Lipid self-care plan printed.   Lipid education handout printed      Resource handout printed.

## 2010-07-08 NOTE — Assessment & Plan Note (Signed)
Summary: est-ck/fu/meds/cfb   Vital Signs:  Patient profile:   70 year old male Height:      66 inches (167.64 cm) Weight:      124.5 pounds (57.73 kg) BMI:     20.57 Temp:     94.7 degrees F (34.83 degrees C) oral Pulse rate:   67 / minute BP sitting:   145 / 87  (right arm) Cuff size:   small  Vitals Entered By: Theotis Barrio NT II (November 20, 2009 11:19 AM) CC: FOLLOW UP ON X-RAY REPORT Pain Assessment Patient in pain? no       Have you ever been in a relationship where you felt threatened, hurt or afraid?No   Does patient need assistance? Functional Status Self care Ambulation Normal Comments FOLLOW UP ON X-RAY REPORT   CC:  FOLLOW UP ON X-RAY REPORT.  Preventive Screening-Counseling & Management  Alcohol-Tobacco     Alcohol drinks/day: 4     Alcohol type: beer     Alcohol Counseling: to decrease amount and/or frequency of alcohol intake     Smoking Status: quit     Year Quit: 7years ago     Passive Smoke Exposure: no  Caffeine-Diet-Exercise     Does Patient Exercise: yes  Current Medications (verified): 1)  Multivitamins  Tabs (Multiple Vitamin) .... Take 1 Tablet By Mouth Once A Day 2)  Naprosyn 250 Mg Tabs (Naproxen) .... Take 3 Tablets By Mouth When Gout Attack Starts, Then 1 Tablet Every 8 Hours Until Attack Subsides-Take As Needed 3)  Tricor 48 Mg Tabs (Fenofibrate) .... Take 1 Tablet By Mouth Once A Day 4)  Ambien 5 Mg Tabs (Zolpidem Tartrate) .... Take 1 Tablet By Mouth At Bedtime As Needed For Sleep  Allergies (verified): No Known Drug Allergies  Past History:  Past Medical History: Last updated: 06/29/2008 Hyperlipidemia Asbestosis GERD Tubulovillous adenomatous colonic polyp Hx of colonic perforation, during colonoscopy Tobacco abuse, hx of, quit 2001. Alcohol abuse Gout, diagnosed by ortho, July 2009      -inadequate aspiration of MTP joint left great toe  06/18/08           -fluid:  red, bloody, cell count not performed (TNP) and no  crystals or organisms seen.  Past Surgical History: Last updated: 2008/12/01 colonoscopy. Left wrist surgery. Surgery for Dupeytrens contracture 5 yrs ago. cyst removed from neck  Family History: Last updated: December 01, 2008 Mom- Died.History of aneurysms in brain. Dad- Died. History of  mesothelioma. Sister- HTN. Family History of Heart Disease: Mother No FH of Colon Cancer:  Social History: Last updated: 12-01-2008 Lives with wife who has medical problems that are causing him stress. Retired.  Exposed to asbestos at work. Married Former Smoker-stopped 10 years ago Alcohol use-yes-4 beers per day Drug use-no Regular exercise-yes  Risk Factors: Alcohol Use: 4 (11/20/2009) Exercise: yes (11/20/2009)  Risk Factors: Smoking Status: quit (11/20/2009) Passive Smoke Exposure: no (11/20/2009)  Review of Systems      See HPI  Physical Exam  General:  alert, well-developed, well-nourished, and well-hydrated.   Eyes:  vision grossly intact, pupils equal, and pupils round.   Lungs:  normal respiratory effort, no accessory muscle use, normal breath sounds, no crackles, and no wheezes.   Heart:  normal rate, regular rhythm, no murmur, no gallop, no rub, and no JVD.   Abdomen:  normal bowel sounds.   Extremities:  no edema Neurologic:  alert & oriented X3, cranial nerves II-XII intact, strength normal in all extremities, and gait normal.  Psych:  Oriented X3, memory intact for recent and remote, normally interactive, good eye contact, and not depressed appearing.  slightly anxious   Impression & Recommendations:  Problem # 1:  DYSPNEA ON EXERTION (ICD-786.09)  Given history available, ? if related to underlying lung process (see below CXR) Will check PFTs to reassess (last in 2007). Will consider exercise stress test depending on above results   Orders: PFT Basline & Lung Volume (PFT Baseline-Lung  V) PFT Baseline-Pre/Post Bronchodiolator (PFT Baseline-Pre/Pos) PFT's  Baseline w/ DLCO (PFT's Baseline-DLCO)  Problem # 2:  CHEST XRAY, ABNORMAL (ICD-793.1) CXR done 5/5 with abnormal findings. 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  Problem # 4:  GASTROESOPHAGEAL REFLUX DISEASE (ICD-530.81) Follows with Dr. Juanda Chance EGD with dilation on 6/17  Problem # 5:  HYPERLIPIDEMIA (ICD-272.4) will recheck TG today  His updated medication list for this problem includes:    Tricor 48 Mg Tabs (Fenofibrate) .[name]... Take 1 tablet by mouth once a day  Orders: T-Lipid Profile [phone])  Labs Reviewed: SGOT: 18 (06/21/2009)   SGPT: 15 (06/21/2009)   HDL:52 (06/21/2009), 59 (08/30/2008)  LDL:101 (06/21/2009), 97 (14/78/2956)  Chol:212 (06/21/2009), 192 (08/30/2008)  Trig:294 (06/21/2009), 179 (08/30/2008)  Problem # 6:  INSOMNIA (ICD-780.52) worse after stopping alcohol also has wife going into hospice care will increase to 10mg   His updated medication list for this problem includes:    Ambien 10 Mg Tabs (Zolpidem tartrate) .[name]... Take 1 tab by mouth at bedtime as needed sleep.  Complete Medication List: 1)  Multivitamins Tabs (Multiple vitamin) .... Take 1 tablet by mouth once a day 2)  Naprosyn 250 Mg Tabs (Naproxen) .... Take 3 tablets by mouth when gout attack starts, then 1 tablet every 8 hours until attack subsides-take as needed 3)  Tricor 48 Mg Tabs (Fenofibrate) .... Take 1 tablet by mouth once a day 4)  Ambien 10 Mg Tabs (Zolpidem tartrate) .... Take 1 tab by mouth at bedtime as needed sleep.  Patient Instructions: 1)  Please schedule a follow-up appointment in 3 months. 2)  Think you are doing well overall. 3)  Will check lung function again 4)  Will get CT chest to further assess findings on CXR, if there is any problem we will call you. 5)  Increase ambien to 10mg  (1/2 - 1 tablet at bedtime as  needed sleep). 6)  If you have any other problems, call clinic.  Prescriptions: AMBIEN 10 MG TABS (ZOLPIDEM TARTRATE) Take 1 tab by mouth at bedtime as needed sleep.  #30 x 0   Entered and Authorized by:   Mariea Stable MD   Signed by:   Mariea Stable MD on 11/20/2009   Method used:   Print then Give to Patient   RxID:   [id number]  Process Orders Check Orders Results:     [facility]: Check successful Tests Sent for requisitioning (November 20, 2009 12:03 PM):     11/20/2009: [facility] -- T-Lipid Profile [phone] (signed)    Prevention & Chronic Care Immunizations   Influenza vaccine: Historical  (03/08/2009)   Influenza vaccine deferral: Not available  (11/20/2009)    Tetanus booster: Not documented   Td booster deferral: Deferred  (08/16/2009)    Pneumococcal vaccine: Pneumovax (Medicare)  (06/21/2009)    H. zoster vaccine: Not documented   H. zoster vaccine deferral: Not available  (11/20/2009)  Colorectal Screening   Hemoccult: Not documented    Colonoscopy: Location:  [facility].    (11/27/2008)   Colonoscopy due: 12/2011  Other Screening   PSA: Not documented   PSA action/deferral: Discussed-decision deferred  (08/16/2009)   Smoking status: quit  (11/20/2009)  Lipids   Total Cholesterol: 212  (06/21/2009)   Lipid panel action/deferral: Lipid Panel ordered   LDL: 101  (06/21/2009)   LDL Direct: Not documented   HDL: 52  (06/21/2009)   Triglycerides: 294  (06/21/2009)    SGOT (AST): 18  (06/21/2009)   BMP action: Ordered   SGPT (ALT): 15  (06/21/2009)   Alkaline phosphatase: 55  (06/21/2009)   Total bilirubin: 0.6  (06/21/2009)    Lipid flowsheet reviewed?: Yes   Progress toward LDL goal: At goal  Self-Management Support :   Personal Goals (by the next clinic visit) :      Personal LDL goal: 130  (06/21/2009)    Lipid self-management support: Written self-care plan  (11/20/2009)    Lipid self-care plan printed.

## 2010-07-08 NOTE — Medication Information (Signed)
Summary: RESTORIL  RESTORIL   Imported By: Margie Billet 02/18/2010 12:05:14  _____________________________________________________________________  External Attachment:    Type:   Image     Comment:   External Document

## 2010-07-08 NOTE — Letter (Signed)
Summary: EGD Instructions  [facility]  [address] [place], [place] [address]   Phone: [phone]  Fax: [phone]       [name]    March 05, 1941    MRN: [id number]       Procedure Day /Date: Friday 11/15/09     Arrival Time: 2:30 pm     Procedure Time: 3:30 pm     Location of Procedure:                    _ x _ [facility] (4th Floor)  PREPARATION FOR ENDOSCOPY   On 11/15/09 THE DAY OF THE PROCEDURE:  1.   No solid foods, milk or milk products are allowed after midnight the night before your procedure.  2.   Do not drink anything colored red or purple.  Avoid juices with pulp.  No orange juice.  3.  You may drink clear liquids until 1:30 pm, which is 2 hours before your procedure.                                                                                                CLEAR LIQUIDS INCLUDE: Water Jello Ice Popsicles Tea (sugar ok, no milk/cream) Powdered fruit flavored drinks Coffee (sugar ok, no milk/cream) Gatorade Juice: apple, white grape, white cranberry  Lemonade Clear bullion, consomm, broth Carbonated beverages (any kind) Strained chicken noodle soup Hard Candy   MEDICATION INSTRUCTIONS  Unless otherwise instructed, you should take regular prescription medications with a small sip of water as early as possible the morning of your procedure.                   OTHER INSTRUCTIONS  You will need a responsible adult at least 69 years of age to accompany you and drive you home.   This person must remain in the waiting room during your procedure.  Wear loose fitting clothing that is easily removed.  Leave jewelry and other valuables at home.  However, you may wish to bring a book to read or an iPod/MP3 player to listen to music as you wait for your procedure to start.  Remove all body piercing jewelry and leave at home.  Total time from sign-in until discharge is approximately 2-3 hours.  You should go  home directly after your procedure and rest.  You can resume normal activities the day after your procedure.  The day of your procedure you should not:   Drive   Make legal decisions   Operate machinery   Drink alcohol   Return to work  You will receive specific instructions about eating, activities and medications before you leave.    The above instructions have been reviewed and explained to me by   Lamona Curl CMA Duncan Dull)  Oct 24, 2009 1:43 PM     I fully understand and can verbalize these instructions _____________________________ Date 10/24/09

## 2010-07-08 NOTE — Assessment & Plan Note (Signed)
Summary: problem with sleeping med/gg   Vital Signs:  Patient profile:   70 year old male Height:      66 inches (167.64 cm) Weight:      130.0 pounds (59.09 kg) BMI:     21.06 Temp:     94.9 degrees F (34.94 degrees C) oral Pulse rate:   61 / minute BP sitting:   152 / 81  (right arm)  Vitals Entered By: Stanton Kidney Ditzler RN (January 16, 2010 2:48 PM) Is Patient Diabetic? No Pain Assessment Patient in pain? no      Nutritional Status BMI of 19 -24 = normal Nutritional Status Detail appetite good  Have you ever been in a relationship where you felt threatened, hurt or afraid?denies   Does patient need assistance? Functional Status Self care Ambulation Normal Comments Wife at home - hospice helping. Pt  stopped alcohol 11 weeks ago. Past 3 weeks not sleeping. Stopped chol med since not sleeping per Dr Onalee Hua - no change. Occ green productive cough.   Primary Care Provider:  Mariea Stable MD   History of Present Illness: 70 yo male with PMH of Asbestosis, Tubulovillous adenoma, intertinal perforation during colonoscopy, hx of alcohol dependence x40 yrs quit 10/10/09, hx of tobacco abuse (quit), gout, insomnia presents today for difficulty sleeping.  Patient states that Dr Juanda Chance gave him ambien 5mg  and it helped him sleeping.  He then saw Dr. Onalee Hua who prescribed Pravastatin and that affected his sleep.  Dr. Onalee Hua increased his Ambien to 10mg  and it heped in the beginning but now it seems not to work as well. He reports doing strange things that he does not remember after he woke up.  He only gets about 4 hours of sleep per night, goes to sleep around 1am.  He used to drink caffeinated tea but now try decaf but still cannot sleep.  He tried his wife's medication, Restoril, and it works well and he is able to sleep at night. He has a chronic cough but been coughing up small amount of green sputum/yellow 1-2 wks, no fever, no SOB, chestpain.      Depression History:      The  patient denies a depressed mood most of the day and a diminished interest in his usual daily activities.         Preventive Screening-Counseling & Management  Alcohol-Tobacco     Alcohol drinks/day: 4     Alcohol type: beer     Alcohol Counseling: to decrease amount and/or frequency of alcohol intake     Smoking Status: quit     Year Quit: 7years ago     Passive Smoke Exposure: no  Caffeine-Diet-Exercise     Does Patient Exercise: yes  Allergies: No Known Drug Allergies  Review of Systems  The patient denies anorexia, fever, weight loss, weight gain, vision loss, decreased hearing, hoarseness, chest pain, syncope, dyspnea on exertion, peripheral edema, prolonged cough, headaches, hemoptysis, abdominal pain, melena, hematochezia, severe indigestion/heartburn, hematuria, incontinence, genital sores, muscle weakness, suspicious skin lesions, transient blindness, difficulty walking, depression, unusual weight change, abnormal bleeding, enlarged lymph nodes, angioedema, breast masses, and testicular masses.    Physical Exam  General:  alert, well-developed, well-nourished, and well-hydrated.   Lungs:  normal respiratory effort, no intercostal retractions, no accessory muscle use, normal breath sounds, no dullness, no fremitus, no crackles, and no wheezes.   Heart:  normal rate, regular rhythm, no murmur, no gallop, no rub, and no JVD.   Abdomen:  soft, non-tender,  normal bowel sounds, no distention, no masses, and no guarding.   Extremities:  No edema Neurologic:  alert & oriented X3.     Impression & Recommendations:  Problem # 1:  INSOMNIA (ICD-780.52) Will try Restoril 7.5mg  one tablet at bedtime since Ambien 10mg  is not working and he is experiencing side effects. Will follow up with Dr. Onalee Hua in 2 weeks.  The following medications were removed from the medication list:    Ambien 10 Mg Tabs (Zolpidem tartrate) .[name]... Take 1 tab by mouth at bedtime as needed sleep. His updated  medication list for this problem includes:    Restoril 7.5 Mg Caps (Temazepam) .[name]... 1/2 to 1 tablet by mouth at bedtime  Problem # 2:  HYPERLIPIDEMIA (ICD-272.4) Will hold Pravachol for now until he sees Dr. Onalee Hua since he said the Pravachol disturbed his sleep.  His lipid panel on 11/20/2009: LDL 145, Total chol 212  His updated medication list for this problem includes:    Pravachol 40 Mg Tabs (Pravastatin sodium) .[name]... Take 1 tab by mouth at bedtime  Complete Medication List: 1)  Multivitamins Tabs (Multiple vitamin) .... Take 1 tablet by mouth once a day 2)  Pravachol 40 Mg Tabs (Pravastatin sodium) .... Take 1 tab by mouth at bedtime 3)  Restoril 7.5 Mg Caps (Temazepam) .... 1/2 to 1 tablet by mouth at bedtime  Patient Instructions: 1)  1. Take 1/2 to 1 tablet of Restoril by mouth at bedtime as needed for sleep 2)  2. Follow up with Dr. Onalee Hua in 2 weeks 3)  3. Can continue holding Pravachol until you see Dr. Onalee Hua. Prescriptions: RESTORIL 7.5 MG CAPS (TEMAZEPAM) 1/2 to 1 tablet by mouth at bedtime  #15 x 0   Entered and Authorized by:   Rosana Berger MD   Signed by:   Rosana Berger MD on 01/16/2010   Method used:   Print then Give to Patient   RxID:   [id number] RESTORIL 7.5 MG CAPS (TEMAZEPAM) 1/2 to 1 tablet by mouth at bedtime  #15 x 0   Entered and Authorized by:   Rosana Berger MD   Signed by:   Rosana Berger MD on 01/16/2010   Method used:   Print then Give to Patient   RxID:   [id number] RESTORIL 7.5 MG CAPS (TEMAZEPAM) 1/2 to 1 tablet by mouth at bedtime  #15 x 0   Entered and Authorized by:   Rosana Berger MD   Signed by:   Rosana Berger MD on 01/16/2010   Method used:   Printed then faxed to ...       [facility]. [phone]* (retail)       [address]       [place], [place]  [address]       Ph: [phone]       Fax: [phone]   RxID:   [id number]    Prevention & Chronic Care Immunizations   Influenza  vaccine: Historical  (03/08/2009)   Influenza vaccine deferral: Not available  (11/20/2009)    Tetanus booster: Not documented   Td booster deferral: Deferred  (08/16/2009)    Pneumococcal vaccine: Pneumovax (Medicare)  (06/21/2009)    H. zoster vaccine: Not documented   H. zoster vaccine deferral: Not available  (11/20/2009)  Colorectal Screening   Hemoccult: Not documented    Colonoscopy: Location:  [facility].    (11/27/2008)   Colonoscopy due: 12/2011  Other Screening  PSA: Not documented   PSA action/deferral: Discussed-decision deferred  (08/16/2009)   Smoking status: quit  (01/16/2010)  Lipids   Total Cholesterol: 212  (11/20/2009)   Lipid panel action/deferral: Lipid Panel ordered   LDL: 145  (11/20/2009)   LDL Direct: Not documented   HDL: 43  (11/20/2009)   Triglycerides: 119  (11/20/2009)    SGOT (AST): 18  (06/21/2009)   BMP action: Ordered   SGPT (ALT): 15  (06/21/2009)   Alkaline phosphatase: 55  (06/21/2009)   Total bilirubin: 0.6  (06/21/2009)    Lipid flowsheet reviewed?: Yes   Progress toward LDL goal: Deteriorated  Self-Management Support :   Personal Goals (by the next clinic visit) :      Personal LDL goal: 130  (06/21/2009)    Patient will work on the following items until the next clinic visit to reach self-care goals:     Medications and monitoring: take my medicines every day, check my blood pressure, bring all of my medications to every visit, weigh myself weekly  (01/16/2010)     Eating: eat more vegetables, use fresh or frozen vegetables, eat foods that are low in salt, eat fruit for snacks and desserts  (01/16/2010)     Activity: take a 30 minute walk every day  (01/16/2010)     Other: RIDES BIKE  (06/21/2009)    Lipid self-management support: Written self-care plan, Education handout, Resources for patients handout  (01/16/2010)   Lipid self-care plan printed.   Lipid education handout printed      Resource handout  printed.

## 2010-07-08 NOTE — Procedures (Signed)
Summary: Upper Endoscopy  Patient: [name] Note: All result statuses are Final unless otherwise noted.  Tests: (1) Upper Endoscopy (EGD)   EGD Upper Endoscopy       DONE     [facility]     [address]     [place], [place]  [address]           ENDOSCOPY PROCEDURE REPORT           PATIENT:  [name], [name]  MR#:  [id number]     BIRTHDATE:  Dec 20, 1940, 69 yrs. old  GENDER:  male           ENDOSCOPIST:  Hedwig Morton. Juanda Chance, MD     Referred by:           PROCEDURE DATE:  11/22/2009     PROCEDURE:  EGD with biopsy, EGD with dilatation over guidewire     ASA CLASS:  Class II     INDICATIONS:  dysphagia, GERD hx of food impaction 2 years ago and     again recently           MEDICATIONS:   Versed 4 mg, Fentanyl 50 mcg     TOPICAL ANESTHETIC:  Exactacain Spray           DESCRIPTION OF PROCEDURE:   After the risks benefits and     alternatives of the procedure were thoroughly explained, informed     consent was obtained.  The LB GIF-H180 [id number] endoscope was     introduced through the mouth and advanced to the second portion of     the duodenum, without limitations.  The instrument was slowly     withdrawn as the mucosa was fully examined.     <<PROCEDUREIMAGES>>           A stricture was found in the distal esophagus (see image1 and     image2). Savary dilation over a guidewire 15 and 16 mm dilators     passed over the guidewire  A hiatal hernia was found (see image6     and image7). 2 cm hital hernia  Duodenitis was found. With     standard forceps, a biopsy was obtained and sent to pathology (see     image4 and image3).  Mild gastritis was found (see image5).     irregular Z-line. With standard forceps, a biopsy was obtained and     sent to pathology (see image8).    Retroflexed views revealed no     abnormalities.    The scope was then withdrawn from the patient     and the procedure completed.           COMPLICATIONS:  None           ENDOSCOPIC IMPRESSION:     1)  Stricture in the distal esophagus     2) Hiatal hernia     3) Duodenitis     4) Mild gastritis     5) Irregular Z-line     islet of gastric mucosa above the z-line, r/o barrett's     s/p dilation of a stricture to 16 mm     RECOMMENDATIONS:     1) Await biopsy results     Prelosec 20 mg po qd, #30, 1 refill           REPEAT EXAM:  In 0 year(s) for.           ______________________________     Hedwig Morton. Juanda Chance, MD  CC:           n.     eSIGNED:   Mylena Sedberry M. Helaine Yackel at 11/22/2009 05:04 PM           Jibri, [place], [id number]  Note: An exclamation mark (!) indicates a result that was not dispersed into the flowsheet. Document Creation Date: 11/22/2009 5:05 PM _______________________________________________________________________  (1) Order result status: Final Collection or observation date-time: 11/22/2009 16:54 Requested date-time:  Receipt date-time:  Reported date-time:  Referring Physician:   Ordering Physician: Lina Sar [id number]) Specimen Source:  Source: Launa Grill Order Number: [id number] Lab site:

## 2010-07-08 NOTE — Assessment & Plan Note (Signed)
Summary: problems w/esophagus--ch.   History of Present Illness Visit Type: Follow-up Visit Primary GI MD: Lina Sar MD Primary Provider: [facility]  Requesting Provider: n/a Chief Complaint: Nausea and vomiting  History of Present Illness:   This is a 70 year old white male with an episode of solid food dysphagia and a food impaction 2 weeks ago while eating a steak. He ended up in the emergency room were the food impaction passed spontaneously. He has been having episodes of dysphagia and actually had an emergency room visit for food impaction approximately 2 years ago. He has a history of gastroesophageal reflux. He takes Naprosyn 250 mg 3 times a day for acute gout. Patient had a near perforation of the right colon during a colonoscopy and polypectomy in 2003. In June 2010, he underwent a recall colonoscopy with findings of a polyp of the cecum and hepatic flexure. The hepatic flexure polyp was carpeted and was resected piecemeal with small amounts left behind. Patient elected to wait until his next colonoscopy to discuss the possibly of laparoscopic resection. Patient is aware of the risk of removing the carpeted polyp endoscopically  having had already one complication .   GI Review of Systems    Reports nausea and  vomiting.      Denies abdominal pain, acid reflux, belching, bloating, chest pain, dysphagia with liquids, dysphagia with solids, heartburn, loss of appetite, vomiting blood, weight loss, and  weight gain.        Denies anal fissure, black tarry stools, change in bowel habit, constipation, diarrhea, diverticulosis, fecal incontinence, heme positive stool, hemorrhoids, irritable bowel syndrome, jaundice, light color stool, liver problems, rectal bleeding, and  rectal pain.    Current Medications (verified): 1)  Multivitamins  Tabs (Multiple Vitamin) .... Take 1 Tablet By Mouth Once A Day 2)  Naprosyn 250 Mg Tabs (Naproxen) .... Take 3 Tablets By Mouth When  Gout Attack Starts, Then 1 Tablet Every 8 Hours Until Attack Subsides-Take As Needed 3)  Tricor 48 Mg Tabs (Fenofibrate) .... Take 1 Tablet By Mouth Once A Day  Allergies (verified): No Known Drug Allergies  Past History:  Past Medical History: Reviewed history from 06/29/2008 and no changes required. Hyperlipidemia Asbestosis GERD Tubulovillous adenomatous colonic polyp Hx of colonic perforation, during colonoscopy Tobacco abuse, hx of, quit 2001. Alcohol abuse Gout, diagnosed by ortho, July 2009      -inadequate aspiration of MTP joint left great toe  06/18/08           -fluid:  red, bloody, cell count not performed (TNP) and no crystals or organisms seen.  Past Surgical History: Reviewed history from 11/08/2008 and no changes required. colonoscopy. Left wrist surgery. Surgery for Dupeytrens contracture 5 yrs ago. cyst removed from neck  Family History: Reviewed history from 11/08/2008 and no changes required. Mom- Died.History of aneurysms in brain. Dad- Died. History of  mesothelioma. Sister- HTN. Family History of Heart Disease: Mother No FH of Colon Cancer:  Social History: Reviewed history from 11/08/2008 and no changes required. Lives with wife who has medical problems that are causing him stress. Retired.  Exposed to asbestos at work. Married Former Smoker-stopped 10 years ago Alcohol use-yes-4 beers per day Drug use-no Regular exercise-yes  Vital Signs:  Patient profile:   70 year old male Height:      66 inches Weight:      127 pounds BMI:     20.57 BSA:     1.65 Pulse rate:   88 / minute  Pulse rhythm:   regular BP sitting:   124 / 72  (left arm) Cuff size:   regular  Vitals Entered By: Ok Anis CMA (Oct 24, 2009 1:20 PM)  Physical Exam  General:  alert, oriented and in no distress. He has a normal voice and there is no cough. Mouth:  No deformity or lesions, dentition normal. Neck:  Supple; no masses or thyromegaly. Lungs:  Clear  throughout to auscultation. Heart:  Regular rate and rhythm; no murmurs, rubs,  or bruits. Abdomen:  soft abdomen with normoactive bowel sounds, no tenderness and no ascites. Liver edge at costal margin. Extremities:  No clubbing, cyanosis, edema or deformities noted. Skin:  Intact without significant lesions or rashes. Psych:  Alert and cooperative. Normal mood and affect.   Impression & Recommendations:  Problem # 1:  DYSPHAGIA UNSPECIFIED (ICD-787.20)  Patient is status post food impaction consistent with an esophageal stricture. He has a history of recent solid food dysphagia consistent with a benign esophageal stricture. We will proceed with an upper endoscopy and possible dilatation. We will check for Barrett's esophagus as well. In the meantime, patient has been given samples of AcipHex 20 mg daily and he was advised to chew carefully.  Orders: EGD SAV (EGD SAV)  Problem # 2:  Hx of PERFORATION, INTESTINE (RUE-454.09) Patient is status post polypectomy and focal peritonitis   without  needing  any  surgical intervention in 2003  Problem # 3:  TUBULOVILLOUS ADENOMA, COLON, HX OF (ICD-V12.72) We will plan a repeat colonoscopy in June 2013.  Problem # 4:  DEPENDENCE, ALCOHOL NEC/NOS, CONTINUOUS (ICD-303.91) Patient stopped drinking 2 weeks ago after the episode of food impaction. He needs something for sleep. We will give him Ambien 5 mg at bedtime.  Patient Instructions: 1)  Ambien 5 mg q.h.s. 2)  Samples of AcipHex 20 mg daily. 3)  antireflux measures. 4)  Upper endoscopy with esophageal dilatation. 5)  Copy sent to : [facility] 6)  The medication list was reviewed and reconciled.  All changed / newly prescribed medications were explained.  A complete medication list was provided to the patient / caregiver. Prescriptions: AMBIEN 5 MG TABS (ZOLPIDEM TARTRATE) Take 1 tablet by mouth at bedtime as needed for sleep  #30 x 1   Entered by:   Lamona Curl CMA  (AAMA)   Authorized by:   Hart Carwin MD   Signed by:   Lamona Curl CMA (AAMA) on 10/24/2009   Method used:   Printed then faxed to ...       [facility]. [phone]* (retail)       [address]       [place], [place]  [address]       Ph: [phone]       Fax: [phone]   RxID:   [id number]

## 2010-07-08 NOTE — Miscellaneous (Signed)
Summary: prilosec-rx  Clinical Lists Changes  Medications: Added new medication of OMEPRAZOLE 20 MG  CPDR (OMEPRAZOLE) 1 each day 30 minutes before meal - Signed Rx of OMEPRAZOLE 20 MG  CPDR (OMEPRAZOLE) 1 each day 30 minutes before meal;  #30 x 1;  Signed;  Entered by: Doristine Church RN II;  Authorized by: Hart Carwin MD;  Method used: Electronically to [facility]. #[id number]*, [address], [place], [place], [place]  [address], Ph: [phone], Fax: [phone]    Prescriptions: OMEPRAZOLE 20 MG  CPDR (OMEPRAZOLE) 1 each day 30 minutes before meal  #30 x 1   Entered by:   Doristine Church RN II   Authorized by:   Hart Carwin MD   Signed by:   Doristine Church RN II on 11/22/2009   Method used:   Electronically to        [facility]. [phone]* (retail)       [address]       [place], [place]  [address]       Ph: [phone]       Fax: [phone]   RxID:   [id number]

## 2010-07-10 NOTE — Assessment & Plan Note (Signed)
Summary: CHECKUP/SB.   Vital Signs:  Patient profile:   70 year old male Height:      66 inches (167.64 cm) Weight:      135.01 pounds (61.37 kg) BMI:     21.87 Temp:     96 degrees F (35.56 degrees C) oral Pulse rate:   71 / minute BP sitting:   120 / 78  (right arm)  Vitals Entered By: Angelina Ok RN (May 21, 2010 11:11 AM) Is Patient Diabetic? No Pain Assessment Patient in pain? no      Nutritional Status BMI of 19 -24 = normal  Have you ever been in a relationship where you felt threatened, hurt or afraid?No   Does patient need assistance? Functional Status Self care Ambulation Normal Comments Check up.  Wife is dying on Hospice.  Needs a check up.  Sleeping med not helping.  Takes 2 hours to go to sleep.  Needs a flu shot.   Primary Care Karena Kinker:  Mariea Stable MD   History of Present Illness: [name] is a 70 yo man with PMH as outlined below.  He is here for follow up and main concern remains sleep.  He has tried multiple medications.  Ambien was increased to 10mg  with adverse effects including bad dreams.  This was changed by another Jacon Whetzel to restoril 7.5mg  without effect.  Subsequent Barbie Croston changed to lunesta which was too expensive followed by change to amitriptyline.  He is currently on that and reports continuing to have difficulty falling asleep.  His wife remains ill on hospice care requiring large doses of opiates for comfort and this remains a large part of his trouble sleeping.  He remains off alcohol since 09/2009 which is also likely contributing.   Depression History:      The patient denies a depressed mood most of the day and a diminished interest in his usual daily activities.         Preventive Screening-Counseling & Management  Alcohol-Tobacco     Alcohol drinks/day: 4     Alcohol type: beer     Alcohol Counseling: to decrease amount and/or frequency of alcohol intake     Smoking Status: quit     Year Quit: 7years ago     Passive  Smoke Exposure: no  Current Medications (verified): 1)  Multivitamins  Tabs (Multiple Vitamin) .... Take 1 Tablet By Mouth Once A Day 2)  Pravachol 40 Mg Tabs (Pravastatin Sodium) .... Take 1 Tab By Mouth At Bedtime 3)  Amitriptyline Hcl 25 Mg Tabs (Amitriptyline Hcl) .... Take 1-2 Tablets By Mouth At Bedtime For Insomnia.  Allergies (verified): No Known Drug Allergies  Past History:  Past Medical History: Last updated: 06/29/2008 Hyperlipidemia Asbestosis GERD Tubulovillous adenomatous colonic polyp Hx of colonic perforation, during colonoscopy Tobacco abuse, hx of, quit 2001. Alcohol abuse Gout, diagnosed by ortho, July 2009      -inadequate aspiration of MTP joint left great toe  06/18/08           -fluid:  red, bloody, cell count not performed (TNP) and no crystals or organisms seen.  Past Surgical History: Last updated: 20-Nov-2008 colonoscopy. Left wrist surgery. Surgery for Dupeytrens contracture 5 yrs ago. cyst removed from neck  Family History: Last updated: November 20, 2008 Mom- Died.History of aneurysms in brain. Dad- Died. History of  mesothelioma. Sister- HTN. Family History of Heart Disease: Mother No FH of Colon Cancer:  Social History: Last updated: 2008-11-20 Lives with wife who has medical problems that are  causing him stress. Retired.  Exposed to asbestos at work. Married Former Smoker-stopped 10 years ago Alcohol use-yes-4 beers per day Drug use-no Regular exercise-yes  Risk Factors: Smoking Status: quit (05/21/2010) Passive Smoke Exposure: no (05/21/2010)  Review of Systems      See HPI  Physical Exam  General:  alert, well-developed, well-nourished, and well-hydrated.   Eyes:  vision grossly intact, pupils equal, and pupils round.   Lungs:  normal respiratory effort and no accessory muscle use.   Neurologic:  alert & oriented X3 and gait normal.   Psych:  Oriented X3, memory intact for recent and remote, and normally interactive.      Impression & Recommendations:  Problem # 1:  INSOMNIA (ICD-780.52)  Refer to HPI for details Would prefer to try restoril over amitriptyline due to side effect profile.  Restoril was ineffective but used at 7.5mg .  Will try 30mg  by mouth at bedtime.   Large issue appear to be wife's illness and stress related to end stage disease. Informed pt that he has counseling available through hospice and that we can arrange appointment with SW for similar needs but he states that he doesn't think he needs it currently and will let us know if he does.  His updated medication list for this problem includes:    Restoril 30 Mg Caps (Temazepam) .[name]... Take 1 tab by mouth at bedtime  Problem # 2:  HYPERLIPIDEMIA (ICD-272.4) Will recheck lipids and LFTs given recent change from tricor to pravachol  His updated medication list for this problem includes:    Pravachol 40 Mg Tabs (Pravastatin sodium) .[name]... Take 1 tab by mouth at bedtime  Orders: T-Lipid Profile [phone])  Labs Reviewed: SGOT: 18 (06/21/2009)   SGPT: 15 (06/21/2009)   HDL:43 (11/20/2009), 52 (06/21/2009)  LDL:145 (11/20/2009), 101 (06/21/2009)  Chol:212 (11/20/2009), 212 (06/21/2009)  Trig:119 (11/20/2009), 294 (06/21/2009)  Problem # 3:  Preventive Health Care (ICD-V70.0) flu vaccine today refused tetanus and states he will get if injured colonoscopy up to date and repeat in 2013 due to adenomatous polyp.  Complete Medication List: 1)  Multivitamins Tabs (Multiple vitamin) .... Take 1 tablet by mouth once a day 2)  Pravachol 40 Mg Tabs (Pravastatin sodium) .... Take 1 tab by mouth at bedtime 3)  Restoril 30 Mg Caps (Temazepam) .... Take 1 tab by mouth at bedtime  Other Orders: T-Comprehensive Metabolic Panel ([id number])  Patient Instructions: 1)  Please schedule a follow-up appointment in 3 months. 2)  Overall doing quite well 3)  will check cholesterol and liver functions today, if there are any problems we will call  you. 4)  will change to restoril as discussed, stop taking the amitriptyline. 5)  if you have any worsening of you sleep, call clinic and leave a message for me. 6)  if you have any other problems before your next appointment, call clinic.  Prescriptions: RESTORIL 30 MG CAPS (TEMAZEPAM) Take 1 tab by mouth at bedtime  #30 x 2   Entered and Authorized by:   Mariea Stable MD   Signed by:   Mariea Stable MD on 05/21/2010   Method used:   Telephoned to ...       [facility]. [phone]* (retail)       [address]       [place], [place]  [address]       Ph: [phone]       Fax: [phone]   RxID:  [id number]    Orders Added: 1)  T-Lipid Profile [[id number]] 2)  T-Comprehensive Metabolic Panel [[id number]] 3)  Est. Patient Level III [[address]782]   Immunizations Administered:  Influenza Vaccine # 1:    Vaccine Type: Fluvax MCR    Mfr: GlaxoSmithKline    Dose: 0.5 ml    Route: IM    Given by: Angelina Ok RN    Exp. Date: 12/06/2010    Lot #: [id number]    VIS given: 12/31/09 version given May 21, 2010.  Flu Vaccine Consent Questions:    Do you have a history of severe allergic reactions to this vaccine? no    Any prior history of allergic reactions to egg and/or gelatin? no    Do you have a sensitivity to the preservative Thimersol? no    Do you have a past history of Guillan-Barre Syndrome? no    Do you currently have an acute febrile illness? no    Have you ever had a severe reaction to latex? no    Vaccine information given and explained to patient? yes   Immunizations Administered:  Influenza Vaccine # 1:    Vaccine Type: Fluvax MCR    Mfr: GlaxoSmithKline    Dose: 0.5 ml    Route: IM    Given by: Angelina Ok RN    Exp. Date: 12/06/2010    Lot #: [id number]    VIS given: 12/31/09 version given May 21, 2010.  Prevention & Chronic Care Immunizations   Influenza vaccine: Fluvax MCR  (05/21/2010)    Influenza vaccine deferral: Refused  (02/05/2010)    Tetanus booster: Not documented   Td booster deferral: Refused  (05/21/2010)    Pneumococcal vaccine: Pneumovax (Medicare)  (06/21/2009)    H. zoster vaccine: Not documented   H. zoster vaccine deferral: Not available  (11/20/2009)    Immunization comments: states he will get tetanus vaccine if injured  Colorectal Screening   Hemoccult: Not documented    Colonoscopy: Location:  [facility].    (11/27/2008)   Colonoscopy due: 12/2011  Other Screening   PSA: Not documented   PSA action/deferral: Discussed-decision deferred  (08/16/2009)   Smoking status: quit  (05/21/2010)  Lipids   Total Cholesterol: 212  (11/20/2009)   Lipid panel action/deferral: Lipid Panel ordered   LDL: 145  (11/20/2009)   LDL Direct: Not documented   HDL: 43  (11/20/2009)   Triglycerides: 119  (11/20/2009)    SGOT (AST): 18  (06/21/2009)   BMP action: Ordered   SGPT (ALT): 15  (06/21/2009) CMP ordered    Alkaline phosphatase: 55  (06/21/2009)   Total bilirubin: 0.6  (06/21/2009)    Lipid flowsheet reviewed?: Yes   Progress toward LDL goal: Unchanged  Self-Management Support :   Personal Goals (by the next clinic visit) :      Personal LDL goal: 130  (06/21/2009)    Patient will work on the following items until the next clinic visit to reach self-care goals:     Medications and monitoring: take my medicines every day, bring all of my medications to every visit  (05/21/2010)     Eating: drink diet soda or water instead of juice or soda, eat more vegetables, use fresh or frozen vegetables, eat foods that are low in salt, eat baked foods instead of fried foods, eat fruit for snacks and desserts, limit or avoid alcohol  (05/21/2010)     Activity: take a 30 minute walk every day  (05/21/2010)     Other:  RIDES BIKE  (06/21/2009)    Lipid self-management support: Written self-care plan, Education handout, Pre-printed educational  material, Resources for patients handout  (05/21/2010)   Lipid self-care plan printed.   Lipid education handout printed      Resource handout printed.   Nursing Instructions: Give Flu vaccine today     Vital Signs:  Patient profile:   70 year old male Height:      66 inches (167.64 cm) Weight:      135.01 pounds (61.37 kg) BMI:     21.87 Temp:     96 degrees F (35.56 degrees C) oral Pulse rate:   71 / minute BP sitting:   120 / 78  (right arm)  Vitals Entered By: Angelina Ok RN (May 21, 2010 11:11 AM)

## 2010-08-13 ENCOUNTER — Ambulatory Visit: Payer: Self-pay | Admitting: Internal Medicine

## 2010-08-14 ENCOUNTER — Other Ambulatory Visit: Payer: Self-pay | Admitting: *Deleted

## 2010-08-14 ENCOUNTER — Other Ambulatory Visit: Payer: Self-pay | Admitting: Internal Medicine

## 2010-08-15 MED ORDER — TEMAZEPAM 30 MG PO CAPS: 30.0000 mg | ORAL_CAPSULE | Freq: Every evening | ORAL | Status: DC | PRN

## 2010-08-15 MED ORDER — PRAVASTATIN SODIUM 40 MG PO TABS: 40.0000 mg | ORAL_TABLET | Freq: Every evening | ORAL | Status: DC

## 2010-08-26 LAB — CBC
HCT: 48.5 % (ref 39.0–52.0)
Hemoglobin: 17.3 g/dL — ABNORMAL HIGH (ref 13.0–17.0)
MCHC: 35.6 g/dL (ref 30.0–36.0)
MCV: 97.8 fL (ref 78.0–100.0)
Platelets: 202 10*3/uL (ref 150–400)
RBC: 4.96 MIL/uL (ref 4.22–5.81)
RDW: 12.6 % (ref 11.5–15.5)
WBC: 7.3 10*3/uL (ref 4.0–10.5)

## 2010-08-26 LAB — COMPREHENSIVE METABOLIC PANEL
ALT: 21 U/L (ref 0–53)
AST: 27 U/L (ref 0–37)
Albumin: 4.2 g/dL (ref 3.5–5.2)
Alkaline Phosphatase: 43 U/L (ref 39–117)
BUN: 18 mg/dL (ref 6–23)
CO2: 27 mEq/L (ref 19–32)
Calcium: 9.4 mg/dL (ref 8.4–10.5)
Chloride: 108 mEq/L (ref 96–112)
Creatinine, Ser: 1.06 mg/dL (ref 0.4–1.5)
GFR calc Af Amer: >60 mL/min (ref >60)
GFR calc non Af Amer: >60 mL/min (ref >60)
Glucose, Bld: 91 mg/dL (ref 70–99)
Potassium: 4.5 mEq/L (ref 3.5–5.1)
Sodium: 142 mEq/L (ref 135–145)
Total Bilirubin: 1 mg/dL (ref 0.3–1.2)
Total Protein: 7.4 g/dL (ref 6.0–8.3)

## 2010-08-26 LAB — ETHANOL: Alcohol, Ethyl (B): <5 mg/dL (ref 0–10)

## 2010-08-26 LAB — LIPASE, BLOOD: Lipase: 24 U/L (ref 11–59)

## 2010-08-28 ENCOUNTER — Ambulatory Visit (INDEPENDENT_AMBULATORY_CARE_PROVIDER_SITE_OTHER): Payer: No Typology Code available for payment source | Admitting: Internal Medicine

## 2010-08-28 ENCOUNTER — Encounter: Payer: Self-pay | Admitting: Internal Medicine

## 2010-08-28 VITALS — BP 135/78 | HR 67 | Temp 96.5°F | Wt 140.0 lb

## 2010-08-28 DIAGNOSIS — E785 Hyperlipidemia, unspecified: Secondary | ICD-10-CM

## 2010-08-28 DIAGNOSIS — G4700 Insomnia, unspecified: Secondary | ICD-10-CM

## 2010-08-28 MED ORDER — TEMAZEPAM 15 MG PO CAPS: ORAL_CAPSULE | ORAL | Status: DC

## 2010-08-28 NOTE — Assessment & Plan Note (Signed)
Controlled. Will adjust prescription to give patient the option to take 1-2 tablets of Restoril at bedtime for insomnia.

## 2010-08-28 NOTE — Assessment & Plan Note (Signed)
Patient's goal LDL <160. Last LDL 79. Will give a patient a trial of 6 months of just therapeutic lifestyle changes without taking statin. Recheck FLP in 6 months and reassess the need to restart medication.

## 2010-08-28 NOTE — Progress Notes (Signed)
  Subjective:    Patient ID: [name], male    DOB: [birth year], 70 y.o.   MRN: [id number]  HPI  70 yo man with  Past Medical History  Diagnosis Date  . Hyperlipidemia   . Asbestosis   . GERD (gastroesophageal reflux disease)   . Colonic polyp     Tubulovillous adenomatous  . Tobacco abuse     quit 2001  . Alcohol abuse   . Gout 12/2007    inadequate aspiration of MTP joint left great toe  06/18/08; -fluid:  red, bloody, cell count not performed (TNP) and no crystals or organisms seen.   comes to the clinic because he would like a change to his prescription of Restoril. He has no complains.   He would like know if he can stop his statin.  Review of Systems  [all other systems reviewed and are negative       Objective:   Physical Exam  [vitalsreviewed. Constitutional: He is oriented to person, place, and time. He appears well-developed and well-nourished. No distress.  HENT:  Mouth/Throat: Oropharynx is clear and moist.  Eyes: Conjunctivae and EOM are normal. Pupils are equal, round, and reactive to light.  Neck: Normal range of motion. Neck supple.  Cardiovascular: Normal rate, regular rhythm and normal heart sounds.   Pulmonary/Chest: Effort normal and breath sounds normal.  Abdominal: Soft. Bowel sounds are normal.  Musculoskeletal: Normal range of motion.  Neurological: He is alert and oriented to person, place, and time.  Skin: No rash noted. He is not diaphoretic. No erythema.  Psychiatric: He has a normal mood and affect.          Assessment & Plan:

## 2010-08-28 NOTE — Patient Instructions (Signed)
Make a follow up appointment in 6 months. Take all medication as directed.

## 2010-10-24 NOTE — Discharge Summary (Signed)
NAMEMarland Kitchen  Cory, Gallagher                            ACCOUNT NO.:  [id number]   MEDICAL RECORD NO.:  [id number]                   PATIENT TYPE:  INP   LOCATION:  0472                                 FACILITY:  [facility]   PHYSICIAN:  Iva Boop, M.D. [facility]           DATE OF BIRTH:  11-29-1940   DATE OF ADMISSION:  05/05/2002  DATE OF DISCHARGE:  05/11/2002                                 DISCHARGE SUMMARY   ADMITTING DIAGNOSES:  1. Localized perforation of the colon at polypectomy site in hepatic flexure     with inflammatory change noted on CT scan, persistent pain, and low grade     fever.  2. History of asbestosis.  3. Remote left wrist fracture.   DISCHARGE DIAGNOSES:  1. Stable status post local colonic perforation at polypectomy site, hepatic     flexure, with markedly improved symptoms and some persistent inflammatory     change on CT scan and fluid, though no defined drainable abscess at the     time of discharge.  2. Hypokalemia, corrected.  3. History of asbestosis.  4. Remote left wrist fracture.  5. Colonic polyp, tubovillous adenoma.   CONSULTATIONS:  Infectious disease, Dr. Ninetta Lights.  Surgery, Dr. Gerrit Friends.   PROCEDURE:  1. CT scan of the abdomen and pelvis done on day of admission and follow-up     CT scan on May 09, 2002.  2. PICC line placement.   BRIEF HISTORY:  The patient is a pleasant 70 year old white male who was  referred to Dr. Leone Payor for hemoccult-positive stool.  He underwent  colonoscopy on May 01, 2002 revealing a large hepatic flexure polyp  approximately 20 mm in size.  This was injected with saline and then snare  polypectomy was undertaken.  The site was also tattooed with [place] ink.  Post procedure he apparently did have some abdominal discomfort, but felt  better after passing flatus.  The following day he had some continued  complaint of abdominal discomfort.  Was evaluated and had abdominal films  which initially could not exclude a  subtle pneumoperitoneum but left lateral  decubitus view was negative.  He was seen by Dr. Leone Payor in the office, felt  to have a benign abdomen other than some tenderness in the right upper  quadrant.  There were no peritoneal findings.  He was treated with  hydrocodone and asked to call if his symptoms did not improve or if he  worsened or developed a fever.  He called back on November 28 stating that  he had had a temperature of 99.8 overnight, had been passing some flatus,  but did not have a bowel movement and really did not feel any better.  He  was also complaining of some mild nausea, but no vomiting.  CT of the  abdomen and pelvis was done and did reveal an area of localized  pneumoperitoneum adjacent to the colon  in the hepatic flexure with a fair  amount of inflammatory change.  He was seen again in the office temperature  of 100 and he is admitted at this time for bowel rest, IV fluid hydration,  IV antibiotic coverage, and surgical consultation.  At the time of admission  a biopsy from his polyp site is still pending.   LABORATORY STUDIES:  On November 28 WBC 8.2, hemoglobin 13.5, hematocrit  38.3, MCV 95.8, platelets 185,000.  Follow-up on November 30 WBC 7.1,  hemoglobin 12.1, hematocrit 34.4, platelets 209,000.  On December 1 WBC 6.9,  hemoglobin 11.9, hematocrit 34, platelets 269,000.  Electrolytes on  admission:  Sodium 138, potassium 3.1, glucose 104, BUN 5, creatinine 1.1,  albumin 3.4.  His potassium was corrected on November 30 with potassium 3.9.  Liver function studies normal on admission with the exception of a total  bilirubin at 1.3.  Blood cultures from November 28 one of two positive for  coag negative Staph.   X-ray studies:  CT scan of the abdomen and pelvis on May 05, 2002  showed a moderate amount of inflammation and extraluminal air along the mid  ascending colon consistent with focal colonic perforation.  No focal fluid  collection identified.   Moderate amount of inflammation within the right  pelvis.  Remainder of the pelvic unremarkable.  Bowel and bladder otherwise  unremarkable.   Follow-up CT scan on May 09, 2002 shows once again markedly abnormal  appearance of the right colon consistent with perforation with extraluminal  air and surrounding inflammation.  There is some fluid around the dependent  position adjacent to the right colon.  No well defined drainable abscess  developed at present.  No other new findings.   HOSPITAL COURSE:  The patient was admitted to the service of Dr. Stan Head.  He was initially kept n.p.o., started on IV fluids of D5 half  normal saline with 40 of KCl and was started on IV Unasyn 3 g q.6h., given  morphine as needed for pain.  Blood cultures were done and we also obtained  surgical consultation.  He was seen by Dr. Gerrit Friends for surgery who agreed  with conservative management and plans for follow-up CT.  By November 29 he  was actually feeling much better as far as pain was concerned and had  remained afebrile.  On November 30 pathology from his polyp returned showing  a tubovillous adenoma with no evidence of dysplasia or malignancy.  He  remained very stable, afebrile with normal white count and we began  advancing his diet which he tolerated without difficulty and continued IV  Unasyn.  Follow-up CT scan was done on December 2 with continued marked  inflammatory changes noted and some dependent fluid, though no definite  abscess.  It was felt that he may eventually develop an abscess.  For that  reason we consulted infectious disease regarding most appropriate IV  antibiotic coverage for home IV antibiotics.  He was seen by Dr. Ninetta Lights in  consultation who felt that continuing IV Unasyn which could be instilled via  a pump would be most appropriate as he seemed to have responded to it well.  He recommended IV Unasyn x10 additional days and then follow-up CT scan thereafter.  The  patient then had PICC line placed.  We arranged home IV  Unasyn with [facility] and he was allowed discharge to home on the  afternoon of May 11, 2002.   DISCHARGE MEDICATIONS:  Medications  other than IV Unasyn at 1.5 g IV q.6h.  was Tylenol on a p.r.n. basis.   DIET:  Very low residue diet.    FOLLOW UP:  He was scheduled for follow-up CT scan of the abdomen and pelvis  on December 15 at 8 a.m.  We would decide at that time whether he would  require any additional IV antibiotics.  He was also scheduled a follow-up  office visit with Dr. Lina Sar on December 17 at 11:40 a.m.   CONDITION ON DISCHARGE:  Stable.     Mike Gip, P.A.-C. [facility]                Iva Boop, M.D. [facility]    AE/MEDQ  D:  05/22/2002  T:  05/22/2002  Job:  [id number]   cc:   Iva Boop, M.D. [facility]  [address] [place], [place] [address]  Fax: 1   Lacretia Leigh. Ninetta Lights, M.D.  [address]  [place]  [place] [address]  Fax: [phone]

## 2010-10-24 NOTE — Consult Note (Signed)
[name], [name]                            ACCOUNT NO.:  [id number]   MEDICAL RECORD NO.:  [id number]                   PATIENT TYPE:  INP   LOCATION:  0472                                 FACILITY:  [facility]   PHYSICIAN:  [name], M.D.                DATE OF BIRTH:  September 08, 1940   DATE OF CONSULTATION:  05/05/2002  DATE OF DISCHARGE:                                   CONSULTATION   REFERRING PHYSICIAN:  Iva Gallagher, M.D.   REASON FOR CONSULTATION:  Colonic perforation.   HISTORY OF PRESENT ILLNESS:  The patient is a 70 year old white male  admitted today by Dr. Stan Gallagher with contained perforation of the colon  following colonoscopy.  The patient had undergone routine screening  colonoscopy and colonoscopic polypectomy on 05/01/02, at [facility].  Subsequent to the procedure, the patient developed lower  abdominal pain.  This progressed.  The patient was seen and evaluated,  including abdominal x-rays and lateral decubitus views.  These failed to  demonstrate free air.  The patient continued to have lower abdominal  discomfort and noted onset of abdominal distention.  Pain became more  severe.  The patient returned to the office of Dr. Leone Gallagher and was sent for  CT scan of abdomen and pelvis at [facility].  This was performed  on 05/05/02 in the morning.  This demonstrated contained air and  inflammatory change along the ascending colon, extending into the right  pelvis, consistent with contained perforation of the colon.  The patient has  noted mild fever to 99 degrees.  He has noted some chills and sweats.  He  denies nausea and vomiting, but has a poor appetite.  He has had loose bowel  movements following his CT prep.  The patient was admitted to [facility] by Dr. Leone Gallagher for bowel rest and intravenous antibiotics.  He is  seen in consultation now by general surgery for possible need for operative  intervention.   PAST MEDICAL  HISTORY:  1. Status post cyst excision, right neck.  2. Status post left wrist closed reduction following fracture.  3. History of asbestosis.   MEDICATIONS:  Pepcid p.r.n. for acid reflux.   ALLERGIES:  No known drug allergies.   SOCIAL HISTORY:  The patient is disabled from asbestosis.  He had worked in  [employer].  He is married.  He quit smoking three years ago.  The patient drinks approximately six beers per day.   FAMILY HISTORY:  Unremarkable.   REVIEW OF SYMPTOMS:  A 15 system reviewed, discussed with the patient  without significant other positives except as noted above.   PHYSICAL EXAMINATION:  GENERAL:  A 70 year old thin white male on [place]  [place] [facility] of [facility].  In no acute distress.  VITAL SIGNS:  Temperature 101.1, pulse 85, respirations 18, blood pressure  109/77.  HEENT:  Normocephalic, atraumatic.  Sclerae are clear.  Dentition is good.  Voice is normal.  NECK:  Anterior examination of the neck shows well-healed surgical wound in  the upper triangle on the right side.  Thyroid is normal without nodularity.  There is no anterior or posterior cervical adenopathy.  There are no  supraclavicular masses.  LUNGS:  Clear to auscultation in both anterior lung fields.  CARDIAC:  Regular rate and rhythm without murmur.  ABDOMEN:  Soft.  There is mild distention.  There are active bowel sounds on  auscultation.  There is no tenderness to percussion.  There is mild  tenderness to palpation in the right lower quadrant.  There is no guarding.  There is no rebound tenderness.  There are no palpable masses.  There is no  sign of hernia.  There are no surgical wounds.  EXTREMITIES:  Nontender without edema.  NEUROLOGIC:  The patient is alert and oriented without focal deficit.   LABORATORY DATA:  White blood cell count 8.2, hemoglobin 13.5, platelet  count 185,000.  Differential was normal.  Potassium level was 3.1.  Total  bilirubin 1.3,  otherwise electrolytes and liver function tests are within  normal limits.   CT scan of the abdomen and pelvis shows contained air and inflammatory  change along the right colon in the pericolic gutter.  There is inflammation  extending into the right pelvis.  There are no significant fluid  collections.  There is no evidence of abscess.   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   PLAN:  1. Agree with bowel rest, n.p.o., and intravenous hydration.  2. Agree with intravenous Unasyn.  3. Follow vital signs and laboratory studies closely.  4. Serial abdominal exams.  5. I have discussed with the patient an approximately 50% chance of need for     exploratory laparotomy, possible bowel resection, and possible need for     colostomy placement.  He understands.                                               [name], M.D.    TMG/MEDQ  D:  05/05/2002  T:  05/05/2002  Job:  [id number]   cc:   [name], M.D. [facility]  [address] [place], [place] [address]  Fax: 1

## 2010-10-24 NOTE — H&P (Signed)
NAME:  [name], [name]                            ACCOUNT NO.:  [id number]   MEDICAL RECORD NO.:  [id number]                   PATIENT TYPE:  OUT   LOCATION:  XRAY                                 [facility]ACILITY:  [facility]   PHYSICIAN:  Iva Boop, M.D. [facility]           DATE OF BIRTH:  1940/12/26   DATE OF ADMISSION:  05/05/2002  DATE OF DISCHARGE:                                HISTORY & PHYSICAL   CHIEF COMPLAINT:  Abdominal pain. A CT scan shows localized perforation at  the site of polypectomy (colonic).   HISTORY:  This is a 70 year old white man that I met on April 24, 2002,  in consultation for Hemoccult positive stool. The patient underwent a  colonoscopy on May 01, 2002. He had a large hepatic flexure polyp  approximately 20 mm in size. I  used saline injection underneath the polyp  to elevate it and snare polypectomy was undertaken and the site was tattooed  with [place] ink. After the procedure he did have some discomfort, but felt  better after passing  flatus. The following day he had some pain and was  evaluated with abdominal films which initial could not excluded subtle  pneumoperitoneum but left lateral decubitus view was negative for that. I  saw him in the office and he had a soft abdomen with bowel sounds present  and he was  passing gas, although he was uncomfortable and had tenderness in  the right upper quadrant without peritoneal findings. He was treated with  some hydrocodone/APAP and was told to call if he did not improve or worsened  or developed a fever.   He called back today, stating he had a temperature to 99.8 overnight. He has  been passing flatus but had not had a bowel movement since his prep, and  really did not feel much better. Some mild nausea but no vomiting. A CT scan  of  the abdomen and pelvis was ordered and I was called by Dr. Jena Gauss  regarding this and he had an area of localized pneumoperitoneum adjacent to  the colon in the hepatic flexure,  right colon region, with a fair amount of  inflammatory change there.  The patient was seen  again in the office today.  Temperature 100 even here in the office.   PAST MEDICAL HISTORY:  As above   CURRENT MEDICATIONS:  1. Pepcid p.r.n.  2. Hydrocodone/APAP.   ALLERGIES:  No known drug allergies.   PAST MEDICAL HISTORY:  Notable for asbestosis.   SOCIAL HISTORY:  The patient is married. He is disabled. He lives with his  wife. He has been a drinker, although he can stop without problems. He quit  smoking  three years ago. He has not had any alcohol for the past several  days and he does not have any problems with DTs or anything like that.   FAMILY HISTORY:  Lung cancer and  rupture aortic aneurysm.   REVIEW OF SYSTEMS:  Positive for elevated blood pressure that is  being  followed up by the internal medicine clinic. Positive for eye glasses. He  has had some polyuria type complaints in the past, but all other systems  appear negative at this time.   PHYSICAL EXAMINATION:  GENERAL:  A pleasant, thin, well developed, well  nourished, middle aged white male in no acute distress at this time.  VITAL SIGNS:  His temperature is 100, blood pressure 120/78 and the pulse is  80.  HEENT:  Eyes are anicteric. Mouth shows moist mucous membranes and is free  of lesions.  NECK:  Supple. Chest clear.  HEART:  S1, S2, no murmurs, rubs, gallops.  ABDOMEN:  Slightly distended. Bowel sounds are present and not increased. No  high pitched bowel sounds. He has a moderate amount of tenderness in the  right upper quadrant without any rebound or peritoneal findings at this  time.  EXTREMITIES:  Free of edema.  SKIN:  Normal color without rash.  NEUROLOGIC:  Mental status, alert and oriented x 3.  LYMPH NODES:  No neck adenopathy.   LABORATORY DATA:  Prior to  his CT scan he had a CMET performed which showed  potassium 3.1, BUN low at 5, bilirubin slightly high at 1.3 and an albumin  of 3.4 but  was otherwise normal.   ASSESSMENT:  Localized perforation of the colon at a polypectomy site. He  has a fair amount of inflammatory change and persistent pain there and  developing of low grade fever. No true fever yet, but these changes are  concerning for the development of infection and perhaps abscess sequelae, if  not treated differently at this point.   PLAN:  Will admit the patient to the hospital, hydration, n.p.o. except ice  chips, IV Unasyn. Will ask surgery to see him because of the issues. If the  patient does have cancer in  his polyp, he would need a localized resection  of this area. I would anticipate at least 48 to 72 hours of IV antibiotics  at this time. Further plan is pending clinical course. Hopefully this will  respond to the IV antibiotics and conservative care. Intravenous morphine  will be used for pain control at this point.   Will follow up on his potassium in the morning. We will replete that with  some runs of IV potassium and potassium in his IV fluids.                                               Iva Boop, M.D. [facility]    CEG/MEDQ  D:  05/05/2002  T:  05/05/2002  Job:  [id number]

## 2010-11-07 ENCOUNTER — Other Ambulatory Visit: Payer: Self-pay | Admitting: *Deleted

## 2010-11-07 MED ORDER — TEMAZEPAM 15 MG PO CAPS: ORAL_CAPSULE | ORAL | Status: DC

## 2010-11-07 NOTE — Telephone Encounter (Signed)
Faxed to pt's pharmacy-[facility].

## 2010-11-12 ENCOUNTER — Ambulatory Visit: Payer: Self-pay | Admitting: Internal Medicine

## 2011-01-09 ENCOUNTER — Other Ambulatory Visit: Payer: Self-pay | Admitting: *Deleted

## 2011-01-12 MED ORDER — TEMAZEPAM 15 MG PO CAPS: ORAL_CAPSULE | ORAL | Status: DC

## 2011-01-12 NOTE — Telephone Encounter (Signed)
Thanks Dr Butcher 

## 2011-01-12 NOTE — Telephone Encounter (Signed)
Ladies.  I'm still up at [facility] this week but will be back around Wednesday morning.  If this is urgent for her could you please have one of the attendings do this for me?  Thanks!  Thayer Ohm

## 2011-01-12 NOTE — Telephone Encounter (Signed)
Called to pharm 

## 2011-01-12 NOTE — Telephone Encounter (Signed)
Has Aug appt. Refilled two month

## 2011-01-23 ENCOUNTER — Ambulatory Visit (INDEPENDENT_AMBULATORY_CARE_PROVIDER_SITE_OTHER): Payer: Medicare Other | Admitting: Internal Medicine

## 2011-01-23 ENCOUNTER — Encounter: Payer: Self-pay | Admitting: Internal Medicine

## 2011-01-23 DIAGNOSIS — E785 Hyperlipidemia, unspecified: Secondary | ICD-10-CM

## 2011-01-23 DIAGNOSIS — R0989 Other specified symptoms and signs involving the circulatory and respiratory systems: Secondary | ICD-10-CM

## 2011-01-23 DIAGNOSIS — G4700 Insomnia, unspecified: Secondary | ICD-10-CM

## 2011-01-23 DIAGNOSIS — R0609 Other forms of dyspnea: Secondary | ICD-10-CM

## 2011-01-23 MED ORDER — TRAZODONE HCL 50 MG PO TABS: 50.0000 mg | ORAL_TABLET | Freq: Every day | ORAL | Status: AC

## 2011-01-23 MED ORDER — ALBUTEROL SULFATE HFA 108 (90 BASE) MCG/ACT IN AERS: 2.0000 | INHALATION_SPRAY | Freq: Four times a day (QID) | RESPIRATORY_TRACT | Status: DC | PRN

## 2011-01-23 NOTE — Patient Instructions (Addendum)
Stop by the clinic sometime next week in the morning before you have breakfast to have your blood drawn for your cholesterol check.  If we need to  follow up on anything I will call you.  You can use the Albuterol inhaler to help with the shortness of breath when your walking to see if it will help.  If it does not please call and let me know.   Start Trazodone 50 mg tablets, 1 tablet before bed daily. Continue your Restoril medication as needed.   Follow up with me in end of September to see how your breathing is going.

## 2011-01-23 NOTE — Progress Notes (Signed)
Subjective:   Patient ID: [name] male   DOB: 1941-01-14 70 y.o.   MRN: [id number]  HPI: [name] is a 70 y.o. man who presents to clinic today for refills of his medication.  He also states that he has been having problems with shortness of breath with activity.  He states that he has been walking and has noticed that when he goes up hill or up a flight of stairs that he gets short of breath.  He does not have to stop because of the shortness of breath.  He denies chest pain, swelling, orthopnea, dizziness, fatigue, or palpitations.  He has no family history of early MI.  He is a former smoker that quit 8-10 years ago.  He is also a former alcoholic and has been clean for 2 years.  He has never seen a cardiologist and is not a diabetic.  He has some hyperlipidemia and used to be on medication but has been diet controlled for sometime.  He has not had a repeat of his lipid panel since stopping his statin medication.    He continues to have problems with insomnia.  He states that some nights his Restoril does not help him get to sleep.  He states that when he takes the medication he sleeps well but if he has to get up to use the bathroom he has trouble falling back to sleep.    Past Medical History  Diagnosis Date  . Hyperlipidemia   . Asbestosis   . GERD (gastroesophageal reflux disease)   . Colonic polyp     Tubulovillous adenomatous  . Tobacco abuse     quit 2001  . Alcohol abuse   . Gout 12/2007    inadequate aspiration of MTP joint left great toe  06/18/08; -fluid:  red, bloody, cell count not performed (TNP) and no crystals or organisms seen.   Current Outpatient Prescriptions  Medication Sig Dispense Refill  . temazepam (RESTORIL) 15 MG capsule Take 1-2 tablets by mouth at bedtime as needed for sleep.  60 capsule  1   No family history on file. History   Social History  . Marital Status: Married    Spouse Name: N/A    Number of Children: N/A  . Years of Education:  N/A   Social History Main Topics  . Smoking status: Former Smoker    Types: Cigarettes    Quit date: 08/27/2000  . Smokeless tobacco: None  . Alcohol Use: No  . Drug Use: None  . Sexually Active: None   Other Topics Concern  . None   Social History Narrative  . None   Review of Systems: Constitutional: Denies fever, chills, diaphoresis, appetite change and fatigue.  HEENT: Denies photophobia, eye pain, redness, hearing loss, ear pain, congestion, sore throat, rhinorrhea, sneezing, mouth sores, trouble swallowing, neck pain, neck stiffness and tinnitus.   Respiratory: Denies SOB, DOE, cough, chest tightness,  and wheezing.   Cardiovascular: Denies chest pain, palpitations and leg swelling.  Gastrointestinal: Denies nausea, vomiting, abdominal pain, diarrhea, constipation, blood in stool and abdominal distention.  Genitourinary: Denies dysuria, urgency, frequency, hematuria, flank pain and difficulty urinating.  Musculoskeletal: Denies myalgias, back pain, joint swelling, arthralgias and gait problem.  Skin: Denies pallor, rash and wound.  Neurological: Denies dizziness, seizures, syncope, weakness, light-headedness, numbness and headaches.  Hematological: Denies adenopathy. Easy bruising, personal or family bleeding history  Psychiatric/Behavioral: Denies suicidal ideation, mood changes, confusion, nervousness, sleep disturbance and agitation  Objective:  Physical  Exam: Filed Vitals:   01/23/11 1432  BP: 105/67  Pulse: 69  Temp: 96.7 F (35.9 C)  TempSrc: Oral  Height: 5\' 6"  (1.676 m)  Weight: 135 lb 9.6 oz (61.508 kg)   Constitutional: Vital signs reviewed.  Patient is a well-developed and well-nourished man in no acute distress and cooperative with exam. Alert and oriented x3.  Head: Normocephalic and atraumatic Ear: TM normal bilaterally Mouth: no erythema or exudates, MMM Eyes: PERRL, EOMI, conjunctivae normal, No scleral icterus.  Neck: Supple, Trachea midline  normal ROM, No JVD, mass, thyromegaly, or carotid bruit present.  Cardiovascular: RRR, S1 normal, S2 normal, no MRG, pulses symmetric and intact bilaterally Pulmonary/Chest: Distant breath sounds, CTAB, no wheezes, rales, or rhonchi Abdominal: Soft. Non-tender, non-distended, bowel sounds are normal, no masses, organomegaly, or guarding present.  GU: no CVA tenderness Musculoskeletal: No joint deformities, erythema, or stiffness, ROM full and no nontender Hematology: no cervical, inginal, or axillary adenopathy.  Neurological: A&O x3, Strenght is normal and symmetric bilaterally, cranial nerve II-XII are grossly intact, no focal motor deficit, sensory intact to light touch bilaterally.  Skin: Warm, dry and intact. No rash, cyanosis, or clubbing.  Psychiatric: Normal mood and affect. speech and behavior is normal. Judgment and thought content normal. Cognition and memory are normal.   Assessment & Plan:

## 2011-01-28 NOTE — Assessment & Plan Note (Addendum)
Lab Results  Component Value Date   CHOL 141 05/21/2010   CHOL 212* 11/20/2009   CHOL 212* 06/21/2009   Lab Results  Component Value Date   HDL 37* 05/21/2010   HDL 43 11/20/2009   HDL 52 1/61/0960   Lab Results  Component Value Date   LDLCALC 79 05/21/2010   LDLCALC 145* 11/20/2009   LDLCALC 101* 06/21/2009   Lab Results  Component Value Date   TRIG 124 05/21/2010   TRIG 119 11/20/2009   TRIG 294* 06/21/2009   Lab Results  Component Value Date   CHOLHDL 3.8 Ratio 05/21/2010   CHOLHDL 4.9 Ratio 11/20/2009   CHOLHDL 4.1 Ratio 06/21/2009   No results found for this basename: LDLDIRECT   He is due for repeat lipid panel after being off his pravastatin for almost a year.  He is diet controlled for his HLD currently.  He will return sometime next week for a fasting lipid panel and we will follow up on the results when they become available.

## 2011-01-28 NOTE — Assessment & Plan Note (Signed)
He continues to struggle with insomnia.  He states that sometimes the Restoril does not help but if he takes two then he wakes up groggy and more tired.  He does not like ambien because it seems to make him "hung over" when he takes it.  We discussed using trazodone or remeron and decided to give Trazodone a shot.  We will start low at 50 mg and he can also use his Restoril to help on an as needed basis.

## 2011-01-28 NOTE — Assessment & Plan Note (Signed)
He continues to have problems with some mild dyspnea on exertion.  He has not signs of clinical fluid overload and no wheezing heard on exam.  Chest x-ray does suspect COPD so we will start with an albuterol inhaler to see if this helps him.  If it does we will likely need to schedule for PFTs to classify his airway disease and consider starting treatment.  He has no signs of cardiac causes of his DOE but we will have to keep that in mind as we move forward with the evaluation.

## 2011-01-30 ENCOUNTER — Other Ambulatory Visit: Payer: Medicare Other

## 2011-01-30 DIAGNOSIS — E785 Hyperlipidemia, unspecified: Secondary | ICD-10-CM

## 2011-01-30 LAB — LIPID PANEL
Cholesterol: 257 mg/dL — ABNORMAL HIGH (ref 0–200)
HDL: 38 mg/dL — ABNORMAL LOW (ref >39)
Total CHOL/HDL Ratio: 6.8 Ratio
Triglycerides: 124 mg/dL (ref <150)

## 2011-02-03 MED ORDER — PRAVASTATIN SODIUM 20 MG PO TABS: 20.0000 mg | ORAL_TABLET | Freq: Every day | ORAL | Status: DC

## 2011-02-03 NOTE — Progress Notes (Signed)
Lab Results  Component Value Date   CHOL 257* 01/30/2011   CHOL 141 05/21/2010   CHOL 212* 11/20/2009   Lab Results  Component Value Date   HDL 38* 01/30/2011   HDL 37* 05/21/2010   HDL 43 11/20/2009   Lab Results  Component Value Date   LDLCALC 194* 01/30/2011   LDLCALC 79 05/21/2010   LDLCALC 145* 11/20/2009   Lab Results  Component Value Date   TRIG 124 01/30/2011   TRIG 124 05/21/2010   TRIG 119 11/20/2009   Lab Results  Component Value Date   CHOLHDL 6.8 01/30/2011   CHOLHDL 3.8 Ratio 05/21/2010   CHOLHDL 4.9 Ratio 11/20/2009   No results found for this basename: LDLDIRECT   His cholesterol is above his goal for now.  His goal is <160 and is well above that.  We will add back his pravastatin at 20 mg and recheck his cholesterol in 3 months or so.  I called the patient to discuss this and he is agreeable to restart the medication.

## 2011-02-03 NOTE — Progress Notes (Signed)
Addended by: Leodis Sias on: 02/03/2011 03:33 PM   Modules accepted: Orders

## 2011-04-10 ENCOUNTER — Encounter: Payer: Self-pay | Admitting: Internal Medicine

## 2011-04-10 ENCOUNTER — Ambulatory Visit (INDEPENDENT_AMBULATORY_CARE_PROVIDER_SITE_OTHER): Payer: Medicare Other | Admitting: Internal Medicine

## 2011-04-10 DIAGNOSIS — Z86010 Personal history of colonic polyps: Secondary | ICD-10-CM

## 2011-04-10 DIAGNOSIS — Z299 Encounter for prophylactic measures, unspecified: Secondary | ICD-10-CM

## 2011-04-10 DIAGNOSIS — Z23 Encounter for immunization: Secondary | ICD-10-CM

## 2011-04-10 DIAGNOSIS — G4700 Insomnia, unspecified: Secondary | ICD-10-CM

## 2011-04-10 DIAGNOSIS — E785 Hyperlipidemia, unspecified: Secondary | ICD-10-CM

## 2011-04-10 MED ORDER — MIRTAZAPINE 15 MG PO TABS: 15.0000 mg | ORAL_TABLET | Freq: Every day | ORAL | Status: DC

## 2011-04-10 MED ORDER — PRAVASTATIN SODIUM 20 MG PO TABS: 20.0000 mg | ORAL_TABLET | Freq: Every day | ORAL | Status: DC

## 2011-04-10 MED ORDER — TEMAZEPAM 15 MG PO CAPS: ORAL_CAPSULE | ORAL | Status: DC

## 2011-04-10 NOTE — Patient Instructions (Addendum)
1. Call your Medicare to see how much the cost of the Zostavax.  If you would like to you can get it at [facility], [facility], or public health.   2. Start Remeron (Mirtazipine) 15 mg tablets take 1 tab at bedtime.   If it doesn't seem to be working please call and we can increase it.  You can also take your Restoril with it.  3. The other medication was Trazodone.  4.  Come back next week in the morning before you have breakfast to have your cholesterol rechecked.  If we need to follow up on anything I will call you.  5. Follow up in 3-6 months.

## 2011-04-10 NOTE — Progress Notes (Signed)
Subjective:   Patient ID: [name] male   DOB: December 17, 1940 70 y.o.   MRN: [id number]  HPI: [name] is a 70 y.o. man who presents to clinic today for follow up of his chronic medical conditions including hyperlipidemia, insomnia, and history of Tubulovillous adenoma.    He states that he continues to struggle with insomnia every night. At his last appointment we had started Trazodone and he states that it did not help him at all so he stopped taking it.  He has been taking his Restoril off and on but still struggles to get more then a few hours of sleep per night.  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  He underwent repeat colonoscopy in 2010 and had a recurrent polyp in the area that was removed partially as well as the area tattooed.  At the time he was told that a repeat colonoscopy in 3 years or a question of a partial bowel resection because of the pathology findings.  He elected to wait for 3 years.    He also has some questions about immunizations.  He has seen the commercials on TV and has questions about the Zostavax.  He states he has never had shingles.  He also states he can not remember the last time he had a tetanus shot.  He states that he does not do flu shots because he states that they make him "feel ill."    He states he has been out of his pravastatin for a few weeks and wondering if he needs to take it.   Past Medical History  Diagnosis Date  . Hyperlipidemia   . Asbestosis   . GERD (gastroesophageal reflux disease)   . Colonic polyp     Tubulovillous adenomatous  . Tobacco abuse     quit 2001  . Alcohol abuse   . Gout 12/2007    inadequate aspiration of MTP joint left great toe  06/18/08; -fluid:  red, bloody, cell count not performed (TNP) and no crystals or organisms seen.   Current Outpatient  Prescriptions  Medication Sig Dispense Refill  . albuterol (PROVENTIL HFA;VENTOLIN HFA) 108 (90 BASE) MCG/ACT inhaler Inhale 2 puffs into the lungs every 6 (six) hours as needed for wheezing.  1 Inhaler  1  . pravastatin (PRAVACHOL) 20 MG tablet Take 1 tablet (20 mg total) by mouth daily.  30 tablet  3  . temazepam (RESTORIL) 15 MG capsule Take 1-2 tablets by mouth at bedtime as needed for sleep.  60 capsule  1   No family history on file. History   Social History  . Marital Status: Married    Spouse Name: N/A    Number of Children: N/A  . Years of Education: N/A   Social History Main Topics  . Smoking status: Former Smoker    Types: Cigarettes    Quit date: 08/27/2000  . Smokeless tobacco: None  . Alcohol Use: No  . Drug Use: None  . Sexually Active: None   Other Topics Concern  . None   Social History Narrative  . None   Review of Systems: Constitutional: Denies fever, chills, diaphoresis, appetite change and fatigue.  HEENT: Denies photophobia, eye pain, redness, hearing loss, ear pain, congestion, sore throat, rhinorrhea, sneezing, mouth sores, trouble swallowing, neck pain, neck stiffness and tinnitus.  Respiratory: Denies SOB, DOE, cough, chest tightness,  and wheezing.   Cardiovascular: Denies chest pain, palpitations and leg swelling.  Gastrointestinal: Denies nausea, vomiting, abdominal pain, diarrhea, constipation, blood in stool and abdominal distention.  Genitourinary: Denies dysuria, urgency, frequency, hematuria, flank pain and difficulty urinating.  Musculoskeletal: Denies myalgias, back pain, joint swelling, arthralgias and gait problem.  Skin: Denies pallor, rash and wound.  Neurological: Denies dizziness, seizures, syncope, weakness, light-headedness, numbness and headaches.  Hematological: Denies adenopathy. Easy bruising, personal or family bleeding history  Psychiatric/Behavioral: Denies suicidal ideation, mood changes, confusion, nervousness, sleep  disturbance and agitation  Objective:  Physical Exam: Filed Vitals:   04/10/11 1410  BP: 121/67  Pulse: 62  Temp: 97.2 F (36.2 C)  TempSrc: Oral  Height: 5\' 6"  (1.676 m)  Weight: 136 lb 11.2 oz (62.007 kg)   Constitutional: Vital signs reviewed.  Patient is a well-developed and well-nourished mildly anxious man in no acute distress and cooperative with exam. Alert and oriented x3.  Head: Normocephalic and atraumatic Ear: TM normal bilaterally Mouth: no erythema or exudates, MMM Eyes: PERRL, EOMI, conjunctivae normal, No scleral icterus.  Neck: Supple, Trachea midline normal ROM, No JVD, mass, thyromegaly, or carotid bruit present.  Cardiovascular: RRR, S1 normal, S2 normal, no MRG, pulses symmetric and intact bilaterally Pulmonary/Chest: CTAB, no wheezes, rales, or rhonchi Abdominal: Soft. Non-tender, non-distended, bowel sounds are normal, no masses, organomegaly, or guarding present.  GU: no CVA tenderness Musculoskeletal: No joint deformities, erythema, or stiffness, ROM full and no nontender Hematology: no cervical, inginal, or axillary adenopathy.  Neurological: A&O x3, Strength is normal and symmetric bilaterally, cranial nerve II-XII are grossly intact, no focal motor deficit, sensory intact to light touch bilaterally.  Skin: Warm, dry and intact. No rash, cyanosis, or clubbing.  Psychiatric: Normal mood and affect. speech and behavior is normal. Judgment and thought content normal. Cognition and memory are normal.   Assessment & Plan:

## 2011-04-14 ENCOUNTER — Other Ambulatory Visit: Payer: Medicare Other

## 2011-04-14 DIAGNOSIS — E785 Hyperlipidemia, unspecified: Secondary | ICD-10-CM

## 2011-04-14 LAB — LIPID PANEL
Cholesterol: 192 mg/dL (ref 0–200)
LDL Cholesterol: 127 mg/dL — ABNORMAL HIGH (ref 0–99)
Total CHOL/HDL Ratio: 5.1 Ratio
VLDL: 27 mg/dL (ref 0–40)

## 2011-04-27 ENCOUNTER — Telehealth: Payer: Self-pay | Admitting: *Deleted

## 2011-04-27 NOTE — Telephone Encounter (Signed)
Pt called stating the sleeping pills are not working. He has been on mirtazapine, for 2 weeks.  Can you order something else? Pharmacy [facility] on [address].

## 2011-04-27 NOTE — Telephone Encounter (Signed)
Spoke to Mr. Corter and he states that he is still having a hard time falling asleep.  He has been taking 1/2 of the remeron then taking another 1/2 an hour later.  He has not taken any of the restoril since starting the remeron.    Plan:  - Take 1 remeron and 1 restoril at bedtime and follow up with me in 2 weeks.

## 2011-04-30 NOTE — Assessment & Plan Note (Signed)
Lab Results  Component Value Date   CHOL 192 04/14/2011   CHOL 257* 01/30/2011   CHOL 141 05/21/2010   Lab Results  Component Value Date   HDL 38* 04/14/2011   HDL 38* 01/30/2011   HDL 37* 05/21/2010   Lab Results  Component Value Date   LDLCALC 127* 04/14/2011   LDLCALC 194* 01/30/2011   LDLCALC 79 05/21/2010   Lab Results  Component Value Date   TRIG 135 04/14/2011   TRIG 124 01/30/2011   TRIG 124 05/21/2010   Lab Results  Component Value Date   CHOLHDL 5.1 04/14/2011   CHOLHDL 6.8 01/30/2011   CHOLHDL 3.8 Ratio 05/21/2010   No results found for this basename: LDLDIRECT   His goal for his LDL is <130 which he is under.  We will continue to monitor and adjust his medication as needed.  He was reminded that he needs to continue taking the medication.

## 2011-04-30 NOTE — Assessment & Plan Note (Signed)
The last note in 2010 stated that they recommended either removal of the area or repeat colonoscopy in 3 years.  He elected to wait the 3 years which means he will be due for repeat in 11/2011.  He is aware and we discussed the nature of the polyp and that he will need to get it checked and likely have it removed or a possible partial bowel resection.  He is aware and it concerns him because he is the primary caregiver for his wife who is still on Hospice.  We will continue to work to help him through this.

## 2011-04-30 NOTE — Assessment & Plan Note (Signed)
He continues to be plagued by insomnia.  He did not think the trazodone helped at all but he was on a very low dose and did not titrate it up or call me.  He states that he would like to try the other medication we had discusse, Remeron.  We will start low with 1/2 tablet at bedtime and if that does not help increase it to a full tablet with the possibility of increasing it further from there.  He was advised that he could also take his Restoril PRN if the Remeron did not help.

## 2011-06-22 ENCOUNTER — Other Ambulatory Visit: Payer: Self-pay | Admitting: Internal Medicine

## 2011-06-22 NOTE — Telephone Encounter (Signed)
Temazepam rx called to [facility].

## 2011-07-22 ENCOUNTER — Other Ambulatory Visit: Payer: Self-pay | Admitting: *Deleted

## 2011-07-22 DIAGNOSIS — G4700 Insomnia, unspecified: Secondary | ICD-10-CM

## 2011-07-23 MED ORDER — TEMAZEPAM 15 MG PO CAPS: ORAL_CAPSULE | ORAL | Status: DC

## 2011-07-23 NOTE — Telephone Encounter (Signed)
Temazepam rx called to [facility]. 

## 2011-07-23 NOTE — Telephone Encounter (Signed)
Okay to phone this in.  Thanks!

## 2011-08-11 DIAGNOSIS — H2510 Age-related nuclear cataract, unspecified eye: Secondary | ICD-10-CM | POA: Diagnosis not present

## 2011-08-11 DIAGNOSIS — H5210 Myopia, unspecified eye: Secondary | ICD-10-CM | POA: Diagnosis not present

## 2011-08-11 DIAGNOSIS — H25019 Cortical age-related cataract, unspecified eye: Secondary | ICD-10-CM | POA: Diagnosis not present

## 2011-08-11 DIAGNOSIS — H52209 Unspecified astigmatism, unspecified eye: Secondary | ICD-10-CM | POA: Diagnosis not present

## 2011-08-21 ENCOUNTER — Ambulatory Visit (INDEPENDENT_AMBULATORY_CARE_PROVIDER_SITE_OTHER): Payer: Medicare Other | Admitting: Internal Medicine

## 2011-08-21 ENCOUNTER — Encounter: Payer: Self-pay | Admitting: Internal Medicine

## 2011-08-21 VITALS — BP 133/76 | HR 57 | Temp 94.4°F | Ht 66.0 in | Wt 139.4 lb

## 2011-08-21 DIAGNOSIS — G4700 Insomnia, unspecified: Secondary | ICD-10-CM

## 2011-08-21 DIAGNOSIS — E785 Hyperlipidemia, unspecified: Secondary | ICD-10-CM

## 2011-08-21 MED ORDER — MIRTAZAPINE 15 MG PO TABS: 15.0000 mg | ORAL_TABLET | Freq: Every day | ORAL | Status: DC

## 2011-08-21 MED ORDER — ROSUVASTATIN CALCIUM 10 MG PO TABS: 10.0000 mg | ORAL_TABLET | Freq: Every day | ORAL | Status: DC

## 2011-08-21 NOTE — Progress Notes (Signed)
Subjective:   Patient ID: [name] male   DOB: [birth year] 71 y.o.   MRN: [id number]  HPI: [name] is a 71 y.o. man who presents today for follow up of his chronic medical conditions including insomnia and hyperlipidemia.    He is still struggling with sleep.  He states that he continues to have problems falling asleep.  He has been taking the Restoril and it still takes him several hours to fall asleep.  He tried the Remeron for a little while after the last visit but stopped it and went back to the Restoril.  He denies racing thoughts or increased anxiety but he does have some daytime somnolence.  He continues to need to be able to awaken to care for his wife who is dependent on his care at night so he doesn't want to use something like Ambien or Lunesta.   He also states that he has had problems with itching with his pravastatin.  He tried altering the time of the day that he was taking it or if he was taking it with food and continued to have a very generalized itching on days he would take it.  He denies any rash, warmth, fevers, chills, breathing problems, or tachycardia.  He has not had a problem since he stopped the medication a few months ago.    Past Medical History  Diagnosis Date  . Hyperlipidemia   . Asbestosis   . GERD (gastroesophageal reflux disease)   . Colonic polyp     Tubulovillous adenomatous  . Tobacco abuse     quit 2001  . Alcohol abuse   . Gout 12/2007    inadequate aspiration of MTP joint left great toe  06/18/08; -fluid:  red, bloody, cell count not performed (TNP) and no crystals or organisms seen.   Current Outpatient Prescriptions  Medication Sig Dispense Refill  . albuterol (PROVENTIL HFA;VENTOLIN HFA) 108 (90 BASE) MCG/ACT inhaler Inhale 2 puffs into the lungs every 6 (six) hours as needed for wheezing.  1 Inhaler  1  . mirtazapine (REMERON) 15 MG tablet Take 1 tablet (15 mg total) by mouth at bedtime.  30 tablet  2  . pravastatin (PRAVACHOL) 20  MG tablet Take 1 tablet (20 mg total) by mouth daily.  30 tablet  6  . temazepam (RESTORIL) 15 MG capsule Take 1 -2 capsules by mouth at bedtime if needed for sleep  60 capsule  0   No family history on file. History   Social History  . Marital Status: Married    Spouse Name: N/A    Number of Children: N/A  . Years of Education: N/A   Social History Main Topics  . Smoking status: Former Smoker    Types: Cigarettes    Quit date: 08/27/2000  . Smokeless tobacco: None  . Alcohol Use: No  . Drug Use: None  . Sexually Active: None   Other Topics Concern  . None   Social History Narrative  . None   Review of Systems: Constitutional: Denies fever, chills, diaphoresis, appetite change and fatigue.  HEENT: Denies photophobia, eye pain, redness, hearing loss, ear pain, congestion, sore throat, rhinorrhea, sneezing, mouth sores, trouble swallowing, neck pain, neck stiffness and tinnitus.   Respiratory: Denies SOB, DOE, cough, chest tightness,  and wheezing.   Cardiovascular: Denies chest pain, palpitations and leg swelling.  Gastrointestinal: Denies nausea, vomiting, abdominal pain, diarrhea, constipation, blood in stool and abdominal distention.  Genitourinary: Denies dysuria, urgency, frequency, hematuria, flank  pain and difficulty urinating.  Musculoskeletal: Denies myalgias, back pain, joint swelling, arthralgias and gait problem.  Skin: Denies pallor, rash and wound.  Neurological: Denies dizziness, seizures, syncope, weakness, light-headedness, numbness and headaches.  Hematological: Denies adenopathy. Easy bruising, personal or family bleeding history  Psychiatric/Behavioral: Denies suicidal ideation, mood changes, confusion, nervousness, sleep disturbance and agitation  Objective:  Physical Exam: Filed Vitals:   08/21/11 1543  BP: 133/76  Pulse: 57  Temp: 94.4 F (34.7 C)  TempSrc: Oral  Height: 5\' 6"  (1.676 m)  Weight: 139 lb 6.4 oz (63.231 kg)   Constitutional:  Vital signs reviewed.  Patient is a well-developed and well-nourished man in no acute distress and cooperative with exam. Alert and oriented x3.  Head: Normocephalic and atraumatic Ear: TM normal bilaterally Mouth: no erythema or exudates, MMM Eyes: PERRL, EOMI, conjunctivae normal, No scleral icterus.  Neck: Supple, Trachea midline normal ROM, No JVD, mass, thyromegaly, or carotid bruit present.  Cardiovascular: RRR, S1 normal, S2 normal, no MRG, pulses symmetric and intact bilaterally Pulmonary/Chest: CTAB, no wheezes, rales, or rhonchi Abdominal: Soft. Non-tender, non-distended, bowel sounds are normal, no masses, organomegaly, or guarding present.  GU: no CVA tenderness Musculoskeletal: No joint deformities, erythema, or stiffness, ROM full and no nontender Hematology: no cervical, inginal, or axillary adenopathy.  Neurological: A&O x3, Strenght is normal and symmetric bilaterally, cranial nerve II-XII are grossly intact, no focal motor deficit, sensory intact to light touch bilaterally.  Skin: Warm, dry and intact. No rash, cyanosis, or clubbing.  Psychiatric: Normal mood and affect. speech and behavior is normal. Judgment and thought content normal. Cognition and memory are normal.   Assessment & Plan:

## 2011-08-21 NOTE — Patient Instructions (Signed)
1. At bedtime take 1 tablet of the Remeron and 1 capsule of the Restoril for 1 week.  If you are still having trouble after that 1 week go to the pharmacy and pick up Melatonin tablets.  They should be between 3-5 mg.  Take 1 of those with the others.  2.  If you are still struggling after adding the Melatonin please call me.  3.  Stop Pravastatin  4.  Start Crestor 10 mg tablets.  Take 1 tablet daily for your cholesterol  5.  Follow up with me in 3 months to recheck your cholesterol or earlier if the sleep isn't better.

## 2011-08-25 DIAGNOSIS — D235 Other benign neoplasm of skin of trunk: Secondary | ICD-10-CM | POA: Diagnosis not present

## 2011-08-25 DIAGNOSIS — L259 Unspecified contact dermatitis, unspecified cause: Secondary | ICD-10-CM | POA: Diagnosis not present

## 2011-08-25 DIAGNOSIS — L570 Actinic keratosis: Secondary | ICD-10-CM | POA: Diagnosis not present

## 2011-08-28 ENCOUNTER — Telehealth: Payer: Self-pay | Admitting: *Deleted

## 2011-08-28 NOTE — Telephone Encounter (Signed)
Prior  Authorization for Temazepam 15 mg capsules was denied.  Reason for denial letter and process for appeal was placed in Dr. Donnelly Stager box for review.  Angelina Ok, RN 08/28/2011 10:11 AM.

## 2011-08-28 NOTE — Telephone Encounter (Signed)
I think for now we will hold off on the appeal while we are working on finding something to add to the Restoril so that he only needs to take 1 of them daily.  If he continues to not do well we will go through the appeal process.

## 2011-11-03 NOTE — Assessment & Plan Note (Signed)
We had discussed this on the phone after his last appointment since he had stated that the remeron wasn't helping.  The plan at that time was to take 1 restoril and 1 remeron at the same time.  He didn't do this.  We will try that this time to see if it helps him rest and if so we will continue that.  If that does not work he will add an OTC melatonin supplement.  He should call us if he continues to have problems otherwise we will discuss this at his follow up in 3 months.

## 2011-11-03 NOTE — Assessment & Plan Note (Signed)
Lab Results  Component Value Date   CHOL 192 04/14/2011   CHOL 257* 01/30/2011   CHOL 141 05/21/2010   Lab Results  Component Value Date   HDL 38* 04/14/2011   HDL 38* 01/30/2011   HDL 37* 05/21/2010   Lab Results  Component Value Date   LDLCALC 127* 04/14/2011   LDLCALC 194* 01/30/2011   LDLCALC 79 05/21/2010   Lab Results  Component Value Date   TRIG 135 04/14/2011   TRIG 124 01/30/2011   TRIG 124 05/21/2010   Lab Results  Component Value Date   CHOLHDL 5.1 04/14/2011   CHOLHDL 6.8 01/30/2011   CHOLHDL 3.8 Ratio 05/21/2010   No results found for this basename: LDLDIRECT   His LDL is above his goal and he has not been taking his pravastatin because of the side effects.  We will try him on and equivilent dose of Crestor and see if that doesn't cause the itching but will still control his cholesterol.  We will follow up in 3 months to recheck his lipids.

## 2011-11-13 ENCOUNTER — Telehealth: Payer: Self-pay | Admitting: *Deleted

## 2011-11-13 MED ORDER — ZOSTER VACCINE LIVE 19400 UNT/0.65ML ~~LOC~~ SOLR: 0.6500 mL | Freq: Once | SUBCUTANEOUS | Status: AC

## 2011-11-13 NOTE — Telephone Encounter (Signed)
Pharmacy needs Rx for Shingles vaccine - [facility] / [employer] # [id number]. Insurance company approved vaccine if they can have an order. Stanton Kidney Teyanna Thielman RN 11/13/11 2:30PM

## 2011-11-16 NOTE — Telephone Encounter (Signed)
Pharmacy aware Shingles Rx.

## 2011-12-28 ENCOUNTER — Other Ambulatory Visit: Payer: Self-pay | Admitting: *Deleted

## 2011-12-28 DIAGNOSIS — E785 Hyperlipidemia, unspecified: Secondary | ICD-10-CM

## 2011-12-29 MED ORDER — ROSUVASTATIN CALCIUM 10 MG PO TABS: 10.0000 mg | ORAL_TABLET | Freq: Every day | ORAL | Status: DC

## 2012-01-11 ENCOUNTER — Other Ambulatory Visit: Payer: Self-pay | Admitting: *Deleted

## 2012-01-11 DIAGNOSIS — G4700 Insomnia, unspecified: Secondary | ICD-10-CM

## 2012-01-13 NOTE — Telephone Encounter (Signed)
Unable to reach Dr Tonny Branch

## 2012-01-14 ENCOUNTER — Telehealth: Payer: Self-pay | Admitting: *Deleted

## 2012-01-14 DIAGNOSIS — G4700 Insomnia, unspecified: Secondary | ICD-10-CM

## 2012-01-14 MED ORDER — TEMAZEPAM 15 MG PO CAPS: ORAL_CAPSULE | ORAL | Status: DC

## 2012-01-14 MED ORDER — TEMAZEPAM 15 MG PO CAPS: 15.0000 mg | ORAL_CAPSULE | Freq: Every evening | ORAL | Status: DC | PRN

## 2012-01-14 NOTE — Telephone Encounter (Signed)
His latest refill per the [place] narcotic database shows that he has only been getting 30 capsules of the Restoril since at March of 2013.  We will decrease the prescription to Restoril 15 mg capsules Dispense #30.

## 2012-01-14 NOTE — Telephone Encounter (Signed)
Sorry Burgess Estelle was my day off.

## 2012-01-14 NOTE — Telephone Encounter (Signed)
Fax from [facility].  Pt's insurance will pay for Temazepam if you increase mg to 30 and decrease qty to 30 Or a prior authorization has to be done on the 15mg  w/qty#60.  States pt is currently out of med. Thanks

## 2012-01-14 NOTE — Telephone Encounter (Signed)
Pt states he only takes Restoril 1 cap/night and he is not taking Remeron. States sleep pattern is up and down; taking a shower helps. Also states his wife died 10-14-22. And knows he needs to schedule an appt and he will call back.  Dr Tonny Branch can u put in the new rx for Temazepam 15mg  - Take 1 cap at bedtime if needed for sleep Qty# 30. Pt states he only needs 1 cap. Thanks

## 2012-01-14 NOTE — Telephone Encounter (Signed)
Temazepam rx called to [facility].

## 2012-01-14 NOTE — Telephone Encounter (Signed)
Temazepam rx - 1 cap at bedtime  called to [facility].

## 2012-02-17 ENCOUNTER — Encounter: Payer: Self-pay | Admitting: Internal Medicine

## 2012-02-17 ENCOUNTER — Ambulatory Visit (INDEPENDENT_AMBULATORY_CARE_PROVIDER_SITE_OTHER): Payer: Medicare Other | Admitting: Internal Medicine

## 2012-02-17 VITALS — BP 136/82 | HR 82 | Temp 97.8°F | Ht 66.0 in | Wt 142.1 lb

## 2012-02-17 DIAGNOSIS — G4700 Insomnia, unspecified: Secondary | ICD-10-CM

## 2012-02-17 DIAGNOSIS — K219 Gastro-esophageal reflux disease without esophagitis: Secondary | ICD-10-CM

## 2012-02-17 DIAGNOSIS — E785 Hyperlipidemia, unspecified: Secondary | ICD-10-CM

## 2012-02-17 DIAGNOSIS — R0609 Other forms of dyspnea: Secondary | ICD-10-CM | POA: Diagnosis not present

## 2012-02-17 DIAGNOSIS — R0989 Other specified symptoms and signs involving the circulatory and respiratory systems: Secondary | ICD-10-CM

## 2012-02-17 MED ORDER — ATORVASTATIN CALCIUM 40 MG PO TABS: 40.0000 mg | ORAL_TABLET | Freq: Every day | ORAL | Status: DC

## 2012-02-17 MED ORDER — PANTOPRAZOLE SODIUM 40 MG PO TBEC: 40.0000 mg | DELAYED_RELEASE_TABLET | Freq: Every day | ORAL | Status: DC

## 2012-02-17 MED ORDER — TEMAZEPAM 30 MG PO CAPS: 30.0000 mg | ORAL_CAPSULE | Freq: Every evening | ORAL | Status: DC | PRN

## 2012-02-17 NOTE — Progress Notes (Signed)
Subjective:   Patient ID: [name] male   DOB: [birth year] 71 y.o.   MRN: [id number]  HPI: CorySantos L Gallagher is a 71 y.o. man who presents to clinic for follow up on his chronic insomnia, hyperlipidemia, and GERD.  He also is complaining about some mild SOB today.   His wife has passed since I saw him last.  She has been ill for sometime on Hospice care and he has been her primary care giver.  He states that he has been handling it well and denies any problems with depressed mood, decreased concentration, or lack of pleasure in his activities.  He continues to have problems sleeping at night, specifically problems falling asleep but also staying asleep.  He has been using the Restoril and Remeron which help some.    He states that he has been having some burning in the chest and back of his throat.  It is worse at bedtime and he occasionally has cough when he lays back.  He has been taking Zantac 150 mg once daily which has helped some but not complete symptom relief.  He denies nausea, vomiting, or blood in his stool.     He continues to have problems with feeling short of breath when he overexerts himself.  He is able to walk around his block but gets mildly short of breath if he has to go up an incline or has to go up stairs.  He does have some cough during the day that is non-productive. He does not wake up with it during the night.  He has not been using his albuterol inhaler.   Past Medical History  Diagnosis Date  . Hyperlipidemia   . Asbestosis   . GERD (gastroesophageal reflux disease)   . Colonic polyp     Tubulovillous adenomatous  . Tobacco abuse     quit 2001  . Alcohol abuse   . Gout 12/2007    inadequate aspiration of MTP joint left great toe  06/18/08; -fluid:  red, bloody, cell count not performed (TNP) and no crystals or organisms seen.   Current Outpatient Prescriptions  Medication Sig Dispense Refill  . albuterol (PROVENTIL HFA;VENTOLIN HFA) 108 (90 BASE) MCG/ACT  inhaler Inhale 2 puffs into the lungs every 6 (six) hours as needed for wheezing.  1 Inhaler  1  . mirtazapine (REMERON) 15 MG tablet Take 1 tablet (15 mg total) by mouth at bedtime.  30 tablet  2  . rosuvastatin (CRESTOR) 10 MG tablet Take 1 tablet (10 mg total) by mouth daily.  30 tablet  3  . temazepam (RESTORIL) 15 MG capsule Take 1 capsule (15 mg total) by mouth at bedtime as needed for sleep. Take 1 -2 capsules by mouth at bedtime if needed for sleep  30 capsule  5  . DISCONTD: pravastatin (PRAVACHOL) 20 MG tablet Take 1 tablet (20 mg total) by mouth daily.  30 tablet  6   No family history on file. History   Social History  . Marital Status: Married    Spouse Name: N/A    Number of Children: N/A  . Years of Education: N/A   Social History Main Topics  . Smoking status: Former Smoker    Types: Cigarettes    Quit date: 08/27/2000  . Smokeless tobacco: None  . Alcohol Use: No  . Drug Use: None  . Sexually Active: None   Other Topics Concern  . None   Social History Narrative  . None  Review of Systems: 12 system ROS negative except as noted in the HPI.   Objective:  Physical Exam: Filed Vitals:   02/17/12 1527  BP: 136/82  Pulse: 82  Temp: 97.8 F (36.6 C)  TempSrc: Oral  Height: 5\' 6"  (1.676 m)  Weight: 142 lb 1.6 oz (64.456 kg)  SpO2: 97%   Constitutional: Vital signs reviewed.  Patient is a well-developed and well-nourished elderly man in no acute distress and cooperative with exam. Alert and oriented x3.  Head: Normocephalic and atraumatic Ear: TM normal bilaterally Mouth: no erythema or exudates, MMM Eyes: PERRL, EOMI, conjunctivae normal, No scleral icterus.  Neck: Supple, Trachea midline normal ROM, No JVD, mass, thyromegaly, or carotid bruit present.  Cardiovascular: RRR, S1 normal, S2 normal, no MRG, pulses symmetric and intact bilaterally Pulmonary/Chest: mildly prolonged expiratory phase but CTAB, no wheezes, rales, or rhonchi Abdominal: Soft.  Mild epigastric tenderness to palpation, non-distended, bowel sounds are normal, no masses, organomegaly, or guarding present.  GU: no CVA tenderness Musculoskeletal: No joint deformities, erythema, or stiffness, ROM full and no nontender Hematology: no cervical, inginal, or axillary adenopathy.  Neurological: A&O x3, Strength is normal and symmetric bilaterally, cranial nerve II-XII are grossly intact, no focal motor deficit, sensory intact to light touch bilaterally.  Skin: Warm, dry and intact. No rash, cyanosis, or clubbing.  Psychiatric: Normal mood and full affect. speech and behavior is normal. Judgment and thought content normal. Cognition and memory are normal.   Assessment & Plan:

## 2012-02-17 NOTE — Patient Instructions (Signed)
1.  Stop the Crestor  2.  Start Atorvastatin 40 mg tablets.  Take 1 tablet daily for your cholesterol  3.  Increase the Restoril to 30 mg capsules.  Take 1 capsule at bedtime for your sleep.

## 2012-03-14 DIAGNOSIS — M67919 Unspecified disorder of synovium and tendon, unspecified shoulder: Secondary | ICD-10-CM | POA: Diagnosis not present

## 2012-03-14 DIAGNOSIS — M719 Bursopathy, unspecified: Secondary | ICD-10-CM | POA: Diagnosis not present

## 2012-04-05 ENCOUNTER — Telehealth: Payer: Self-pay | Admitting: *Deleted

## 2012-04-05 MED ORDER — ZOLPIDEM TARTRATE 10 MG PO TABS: ORAL_TABLET | ORAL | Status: DC

## 2012-04-05 NOTE — Telephone Encounter (Signed)
I'm okay with that.  We had discussed it at his last appointment.  Please call the prescription in for him.  I would like to have him seen about 2 weeks or so after taking it to see how that is working for him.

## 2012-04-05 NOTE — Telephone Encounter (Signed)
Pt states he needs to move to ambien 10mg , take 1/2 then if not sleepy in 1 hr may repeat w/ 1/2 tablet. #30 w/ 5 refills. He states he is needing to rest now and can't, he feels Remus Loffler as was discussed at his visit would work.

## 2012-04-06 NOTE — Telephone Encounter (Signed)
Called to pharm, pt informed and ask to call for appt for 2 weeks after starting med, he is agreeable

## 2012-04-22 ENCOUNTER — Encounter: Payer: Self-pay | Admitting: Internal Medicine

## 2012-04-22 ENCOUNTER — Ambulatory Visit (INDEPENDENT_AMBULATORY_CARE_PROVIDER_SITE_OTHER): Payer: Medicare Other | Admitting: Internal Medicine

## 2012-04-22 VITALS — BP 127/69 | HR 54 | Temp 96.6°F | Ht 66.0 in | Wt 138.8 lb

## 2012-04-22 DIAGNOSIS — E785 Hyperlipidemia, unspecified: Secondary | ICD-10-CM | POA: Diagnosis not present

## 2012-04-22 DIAGNOSIS — R0609 Other forms of dyspnea: Secondary | ICD-10-CM | POA: Diagnosis not present

## 2012-04-22 DIAGNOSIS — R0989 Other specified symptoms and signs involving the circulatory and respiratory systems: Secondary | ICD-10-CM

## 2012-04-22 DIAGNOSIS — K219 Gastro-esophageal reflux disease without esophagitis: Secondary | ICD-10-CM | POA: Diagnosis not present

## 2012-04-22 MED ORDER — PANTOPRAZOLE SODIUM 40 MG PO TBEC: 40.0000 mg | DELAYED_RELEASE_TABLET | Freq: Every day | ORAL | Status: DC

## 2012-04-22 MED ORDER — ATORVASTATIN CALCIUM 40 MG PO TABS: 40.0000 mg | ORAL_TABLET | Freq: Every day | ORAL | Status: DC

## 2012-04-22 NOTE — Patient Instructions (Addendum)
1.  Start a baby aspirin (81 mg) for your heart  2.  Continue your other medications as prescribed.  3.  Follow up with me in about 3 months.

## 2012-04-22 NOTE — Progress Notes (Signed)
Subjective:   Patient ID: [name] male   DOB: 1940-08-28 71 y.o.   MRN: [id number]  HPI: CoryJessy L Gallagher is a 71 y.o. man who presents to the clinic today for follow up on his chronic medical conditions including hyperlipidemia, GERD, and possible COPD. See Problem focused Assessment and Plan for full details of his chronic medical conditions.   Past Medical History  Diagnosis Date  . Hyperlipidemia   . Asbestosis   . GERD (gastroesophageal reflux disease)   . Colonic polyp     Tubulovillous adenomatous  . Tobacco abuse     quit 2001  . Alcohol abuse   . Gout 12/2007    inadequate aspiration of MTP joint left great toe  06/18/08; -fluid:  red, bloody, cell count not performed (TNP) and no crystals or organisms seen.   Current Outpatient Prescriptions  Medication Sig Dispense Refill  . albuterol (PROVENTIL HFA;VENTOLIN HFA) 108 (90 BASE) MCG/ACT inhaler Inhale 2 puffs into the lungs every 6 (six) hours as needed for wheezing.  1 Inhaler  1  . atorvastatin (LIPITOR) 40 MG tablet Take 1 tablet (40 mg total) by mouth daily.  30 tablet  6  . pantoprazole (PROTONIX) 40 MG tablet Take 1 tablet (40 mg total) by mouth daily.  30 tablet  2  . zolpidem (AMBIEN) 10 MG tablet Take 1/2 tablet by mouth at bedtime daily.  If not asleep in 1 hour okay to take the second half.  30 tablet  1  . [DISCONTINUED] pravastatin (PRAVACHOL) 20 MG tablet Take 1 tablet (20 mg total) by mouth daily.  30 tablet  6   No family history on file. History   Social History  . Marital Status: Married    Spouse Name: N/A    Number of Children: N/A  . Years of Education: N/A   Social History Main Topics  . Smoking status: Former Smoker    Types: Cigarettes    Quit date: 08/27/2000  . Smokeless tobacco: None  . Alcohol Use: No  . Drug Use: None  . Sexually Active: None   Other Topics Concern  . None   Social History Narrative  . None   Review of Systems: A full 12 system ROS is negative except as  noted in the HPI and A&P.   Objective:  Physical Exam: Filed Vitals:   04/22/12 1610  BP: 127/69  Pulse: 54  Temp: 96.6 F (35.9 C)  TempSrc: Oral  Height: 5\' 6"  (1.676 m)  Weight: 138 lb 12.8 oz (62.959 kg)  SpO2: 99%   Constitutional: Vital signs reviewed.  Patient is a well-developed and well-nourished elderly appearing man in no acute distress and cooperative with exam. Alert and oriented x3.  Head: Normocephalic and atraumatic Ear: TM normal bilaterally Mouth: no erythema or exudates, MMM Eyes: PERRL, EOMI, conjunctivae normal, No scleral icterus.  Neck: Supple, Trachea midline normal ROM, No JVD, mass, thyromegaly, or carotid bruit present.  Cardiovascular: RRR, S1 normal, S2 normal, no MRG, pulses symmetric and intact bilaterally Pulmonary/Chest: CTAB, no wheezes, rales, or rhonchi Abdominal: Soft. Non-tender, non-distended, bowel sounds are normal, no masses, organomegaly, or guarding present.  GU: no CVA tenderness Musculoskeletal: No joint deformities, erythema, or stiffness, ROM full and no nontender Hematology: no cervical, inginal, or axillary adenopathy.  Neurological: A&O x3, Strength is normal and symmetric bilaterally, cranial nerve II-XII are grossly intact, no focal motor deficit, sensory intact to light touch bilaterally.  Skin: Warm, dry and intact. No rash, cyanosis,  or clubbing.  Psychiatric: Normal mood and affect. speech and behavior is normal. Judgment and thought content normal. Cognition and memory are normal.   Assessment & Plan:

## 2012-04-23 DIAGNOSIS — Z23 Encounter for immunization: Secondary | ICD-10-CM | POA: Diagnosis not present

## 2012-04-26 ENCOUNTER — Telehealth: Payer: Self-pay | Admitting: *Deleted

## 2012-04-26 NOTE — Telephone Encounter (Signed)
Pt calls and states he got his flu shot

## 2012-06-06 ENCOUNTER — Other Ambulatory Visit: Payer: Self-pay | Admitting: *Deleted

## 2012-06-06 MED ORDER — ZOLPIDEM TARTRATE 10 MG PO TABS: ORAL_TABLET | ORAL | Status: DC

## 2012-06-06 NOTE — Telephone Encounter (Signed)
Rx called in 

## 2012-06-06 NOTE — Telephone Encounter (Signed)
Pt is out of med

## 2012-07-25 ENCOUNTER — Other Ambulatory Visit: Payer: Self-pay | Admitting: *Deleted

## 2012-07-25 DIAGNOSIS — K219 Gastro-esophageal reflux disease without esophagitis: Secondary | ICD-10-CM

## 2012-07-26 MED ORDER — PANTOPRAZOLE SODIUM 40 MG PO TBEC: 40.0000 mg | DELAYED_RELEASE_TABLET | Freq: Every day | ORAL | Status: DC

## 2012-07-28 NOTE — Assessment & Plan Note (Signed)
Lab Results  Component Value Date   CHOL 192 04/14/2011   CHOL 257* 01/30/2011   CHOL 141 05/21/2010   Lab Results  Component Value Date   HDL 38* 04/14/2011   HDL 38* 01/30/2011   HDL 37* 05/21/2010   Lab Results  Component Value Date   LDLCALC 127* 04/14/2011   LDLCALC 194* 01/30/2011   LDLCALC 79 05/21/2010   Lab Results  Component Value Date   TRIG 135 04/14/2011   TRIG 124 01/30/2011   TRIG 124 05/21/2010   Lab Results  Component Value Date   CHOLHDL 5.1 04/14/2011   CHOLHDL 6.8 01/30/2011   CHOLHDL 3.8 Ratio 05/21/2010   No results found for this basename: LDLDIRECT   He is due for his yearly check of his lipids in November.  We will continue the lipitor for now and recheck at his next follow up.

## 2012-07-28 NOTE — Assessment & Plan Note (Signed)
His symptoms are consistent with his previous complaints of GERD.  We will restart Protonix and see how he responds.

## 2012-07-28 NOTE — Assessment & Plan Note (Signed)
He is still struggling with significant insomnia.  Previously he didn't want to use Ambien, which worked well, but he was unable to take care of his wife who might need him during the night.  With her passing he is willing to give ambien a shot again.  We will stop the restoril and remeron and restart the ambien to see how he does.  He was cautioned about driving early in the morning before he knows how long the effects of the [place] work.

## 2012-07-28 NOTE — Assessment & Plan Note (Signed)
He has some DOE which has been similar to the last few times he has complained about it.  He likely has some component of deconditioning along with his known obstructive disease that was seen on the PFTs from 2011. HE was encouraged that when he goes for a walk to use the albuterol prior to and see how he does.  He may need pulmonary rehab or possible LABA therapy if he continues to have problems with DOE.

## 2012-08-12 ENCOUNTER — Encounter: Payer: Medicare Other | Admitting: Internal Medicine

## 2012-08-16 DIAGNOSIS — H25019 Cortical age-related cataract, unspecified eye: Secondary | ICD-10-CM | POA: Diagnosis not present

## 2012-08-17 ENCOUNTER — Encounter: Payer: Self-pay | Admitting: Internal Medicine

## 2012-08-17 ENCOUNTER — Ambulatory Visit (INDEPENDENT_AMBULATORY_CARE_PROVIDER_SITE_OTHER): Payer: Medicare Other | Admitting: Internal Medicine

## 2012-08-17 ENCOUNTER — Ambulatory Visit (HOSPITAL_COMMUNITY)
Admission: RE | Admit: 2012-08-17 | Discharge: 2012-08-17 | Disposition: A | Discharge status: Home / Self Care | Payer: Medicare Other | Source: Ambulatory Visit | Attending: Internal Medicine | Admitting: Internal Medicine

## 2012-08-17 VITALS — BP 135/77 | HR 65 | Temp 96.9°F | Ht 66.0 in | Wt 144.2 lb

## 2012-08-17 DIAGNOSIS — G4700 Insomnia, unspecified: Secondary | ICD-10-CM

## 2012-08-17 DIAGNOSIS — R9431 Abnormal electrocardiogram [ECG] [EKG]: Secondary | ICD-10-CM | POA: Diagnosis not present

## 2012-08-17 DIAGNOSIS — J449 Chronic obstructive pulmonary disease, unspecified: Secondary | ICD-10-CM | POA: Diagnosis not present

## 2012-08-17 MED ORDER — BUDESONIDE-FORMOTEROL FUMARATE 160-4.5 MCG/ACT IN AERO: 2.0000 | INHALATION_SPRAY | Freq: Two times a day (BID) | RESPIRATORY_TRACT | Status: DC

## 2012-08-17 MED ORDER — AMITRIPTYLINE HCL 25 MG PO TABS: ORAL_TABLET | ORAL | Status: DC

## 2012-08-17 NOTE — Patient Instructions (Signed)
1.  Please call Dr. Regino Schultze office to schedule a follow up with her.  2.  Start Symbicort.  Inhale one puff twice daily for your breathing.    - Keep up your activity!    3.  Stop Ambien  4.  Start Amitriptyline 25 mg tablets.  Take 1/2 tablet at bedtime for 5 days then increase to 1 full tablet at bedtime.   5.  Follow up with me in about 3-4 weeks to see how you are doing.

## 2012-08-17 NOTE — Progress Notes (Signed)
Subjective:   Patient ID: ALEN MATHESON male   DOB: 05-18-1941 72 y.o.   MRN: [id number]  HPI: Mr.Nilo L Vey is a 72 y.o. man who presents to the clinic today for follow up on his chronic medical conditions including mild COPD and primary insomnia. See Problem focused Assessment and Plan for full details of his chronic medical conditions.   Past Medical History  Diagnosis Date  . Hyperlipidemia   . Asbestosis   . GERD (gastroesophageal reflux disease)   . Colonic polyp     Tubulovillous adenomatous  . Tobacco abuse     quit 2001  . Alcohol abuse   . Gout 12/2007    inadequate aspiration of MTP joint left great toe  06/18/08; -fluid:  red, bloody, cell count not performed (TNP) and no crystals or organisms seen.   Current Outpatient Prescriptions  Medication Sig Dispense Refill  . albuterol (PROVENTIL HFA;VENTOLIN HFA) 108 (90 BASE) MCG/ACT inhaler Inhale 2 puffs into the lungs every 6 (six) hours as needed for wheezing.  1 Inhaler  1  . atorvastatin (LIPITOR) 40 MG tablet Take 1 tablet (40 mg total) by mouth daily.  30 tablet  6  . pantoprazole (PROTONIX) 40 MG tablet Take 1 tablet (40 mg total) by mouth daily.  30 tablet  2  . zolpidem (AMBIEN) 10 MG tablet Take 1/2 tablet by mouth at bedtime daily.  If not asleep in 1 hour okay to take the second half.  30 tablet  5   No current facility-administered medications for this visit.   No family history on file. History   Social History  . Marital Status: Married    Spouse Name: N/A    Number of Children: N/A  . Years of Education: N/A   Social History Main Topics  . Smoking status: Former Smoker    Types: Cigarettes    Quit date: 08/27/2000  . Smokeless tobacco: Not on file  . Alcohol Use: No  . Drug Use: Not on file  . Sexually Active: Not on file   Other Topics Concern  . Not on file   Social History Narrative  . No narrative on file   Review of Systems: A full 12 system ROS is negative except as noted in the HPI  and A&P.   Objective:  Physical Exam: Filed Vitals:   08/17/12 1333  BP: 135/77  Pulse: 65  Temp: 96.9 F (36.1 C)  TempSrc: Oral  Height: 5\' 6"  (1.676 m)  Weight: 144 lb 3.2 oz (65.409 kg)  SpO2: 98%   Constitutional: Vital signs reviewed.  Patient is a well-developed and well-nourished elderly man in no acute distress and cooperative with exam. Alert and oriented x3.  Head: Normocephalic and atraumatic Ear: TM normal bilaterally Mouth: no erythema or exudates, MMM Eyes: PERRL, EOMI, conjunctivae normal, No scleral icterus.  Neck: Supple, Trachea midline normal ROM, No JVD, mass, thyromegaly, or carotid bruit present.  Cardiovascular: RRR, S1 normal, S2 normal, no MRG, pulses symmetric and intact bilaterally Pulmonary/Chest: mildly prolonged expiratory phase but CTAB, no wheezes, rales, or rhonchi Abdominal: Soft. Non-tender, non-distended, bowel sounds are normal, no masses, organomegaly, or guarding present.  GU: no CVA tenderness Musculoskeletal: No joint deformities, erythema, or stiffness, ROM full and no nontender Hematology: no cervical, inginal, or axillary adenopathy.  Neurological: A&O x3, Strength is normal and symmetric bilaterally, cranial nerve II-XII are grossly intact, no focal motor deficit, sensory intact to light touch bilaterally.  Skin: Warm, dry and intact. No  rash, cyanosis, or clubbing.  Psychiatric: Normal mood and affect. speech and behavior is normal. Judgment and thought content normal. Cognition and memory are normal.   Assessment & Plan:

## 2012-08-24 DIAGNOSIS — H2510 Age-related nuclear cataract, unspecified eye: Secondary | ICD-10-CM | POA: Diagnosis not present

## 2012-08-24 DIAGNOSIS — H25019 Cortical age-related cataract, unspecified eye: Secondary | ICD-10-CM | POA: Diagnosis not present

## 2012-09-12 ENCOUNTER — Other Ambulatory Visit: Payer: Self-pay | Admitting: Internal Medicine

## 2012-09-21 DIAGNOSIS — H25019 Cortical age-related cataract, unspecified eye: Secondary | ICD-10-CM | POA: Diagnosis not present

## 2012-09-21 DIAGNOSIS — H2589 Other age-related cataract: Secondary | ICD-10-CM | POA: Diagnosis not present

## 2012-09-21 DIAGNOSIS — H2510 Age-related nuclear cataract, unspecified eye: Secondary | ICD-10-CM | POA: Diagnosis not present

## 2012-09-21 DIAGNOSIS — H52209 Unspecified astigmatism, unspecified eye: Secondary | ICD-10-CM | POA: Diagnosis not present

## 2012-09-30 ENCOUNTER — Ambulatory Visit (INDEPENDENT_AMBULATORY_CARE_PROVIDER_SITE_OTHER): Payer: Medicare Other | Admitting: Internal Medicine

## 2012-09-30 ENCOUNTER — Encounter: Payer: Self-pay | Admitting: Internal Medicine

## 2012-09-30 VITALS — BP 120/70 | HR 76 | Temp 96.5°F | Ht 66.0 in | Wt 142.7 lb

## 2012-09-30 DIAGNOSIS — G4700 Insomnia, unspecified: Secondary | ICD-10-CM | POA: Diagnosis not present

## 2012-09-30 DIAGNOSIS — J449 Chronic obstructive pulmonary disease, unspecified: Secondary | ICD-10-CM

## 2012-09-30 DIAGNOSIS — F5101 Primary insomnia: Secondary | ICD-10-CM

## 2012-09-30 NOTE — Patient Instructions (Signed)
1.  Continue with your sleeping medications as prescribed for now.    2.  Check with your pharmacy about the difference in cost between Advair, Symbicort, or Dulera.   Which every is cheapest is perfectly fine with me.  3.  Make sure you make your follow up appointment with Dr. Juanda Chance to redo the colonoscopy.   4.  Follow up in July or August to meet your new Primary care doctor.  5.  It has been a pleasure taking care of you over the last few years and I wish you luck in the future!

## 2012-09-30 NOTE — Progress Notes (Signed)
Subjective:   Patient ID: [name] male   DOB: [birth year] 72 y.o.   MRN: [id number]  HPI: [name] is a 72 y.o. man who presents to the clinic today for follow up on his chronic medical conditions including mild COPD, insomnia. See Problem focused Assessment and Plan for full details of his chronic medical conditions.   Past Medical History  Diagnosis Date  . Hyperlipidemia   . Asbestosis   . GERD (gastroesophageal reflux disease)   . Colonic polyp     Tubulovillous adenomatous  . Tobacco abuse     quit 2001  . Alcohol abuse   . Gout 12/2007    inadequate aspiration of MTP joint left great toe  06/18/08; -fluid:  red, bloody, cell count not performed (TNP) and no crystals or organisms seen.   Current Outpatient Prescriptions  Medication Sig Dispense Refill  . albuterol (PROVENTIL HFA;VENTOLIN HFA) 108 (90 BASE) MCG/ACT inhaler Inhale 2 puffs into the lungs every 6 (six) hours as needed for wheezing.  1 Inhaler  1  . amitriptyline (ELAVIL) 25 MG tablet Take 1 tablet (25 mg total) by mouth at bedtime.  30 tablet  1  . atorvastatin (LIPITOR) 40 MG tablet Take 1 tablet (40 mg total) by mouth daily.  30 tablet  6  . budesonide-formoterol (SYMBICORT) 160-4.5 MCG/ACT inhaler Inhale 2 puffs into the lungs 2 (two) times daily.  1 Inhaler  12  . pantoprazole (PROTONIX) 40 MG tablet Take 1 tablet (40 mg total) by mouth daily.  30 tablet  2   No current facility-administered medications for this visit.   No family history on file. History   Social History  . Marital Status: Married    Spouse Name: N/A    Number of Children: N/A  . Years of Education: N/A   Social History Main Topics  . Smoking status: Former Smoker    Types: Cigarettes    Quit date: 08/27/2000  . Smokeless tobacco: None  . Alcohol Use: No  . Drug Use: None  . Sexually Active: None   Other Topics Concern  . None   Social History Narrative  . None   Review of Systems: A full 12 system ROS is  negative except as noted in the HPI and A&P.   Objective:  Physical Exam: Filed Vitals:   09/30/12 1407  BP: 120/70  Pulse: 76  Temp: 96.5 F (35.8 C)  TempSrc: Oral  Height: 5\' 6"  (1.676 m)  Weight: 142 lb 11.2 oz (64.728 kg)  SpO2: 97%   Constitutional: Vital signs reviewed.  Patient is a well-developed and well-nourished elderly man in no acute distress and cooperative with exam. Alert and oriented x3.  Head: Normocephalic and atraumatic Ear: TM normal bilaterally Mouth: no erythema or exudates, MMM Eyes: PERRL, EOMI, conjunctivae normal, No scleral icterus.  Neck: Supple, Trachea midline normal ROM, No JVD, mass, thyromegaly, or carotid bruit present.  Cardiovascular: RRR, S1 normal, S2 normal, no MRG, pulses symmetric and intact bilaterally Pulmonary/Chest: mild prolonged expiratory phase but  CTAB, no wheezes, rales, or rhonchi Abdominal: Soft. Non-tender, non-distended, bowel sounds are normal, no masses, organomegaly, or guarding present.  GU: no CVA tenderness Musculoskeletal: No joint deformities, erythema, or stiffness, ROM full and no nontender Hematology: no cervical, inginal, or axillary adenopathy.  Neurological: A&O x3, Strength is normal and symmetric bilaterally, cranial nerve II-XII are grossly intact, no focal motor deficit, sensory intact to light touch bilaterally.  Skin: Warm, dry and intact. No rash,  cyanosis, or clubbing.  Psychiatric: Normal mood and affect. speech and behavior is normal. Judgment and thought content normal. Cognition and memory are normal.   Assessment & Plan:

## 2012-10-21 ENCOUNTER — Other Ambulatory Visit: Payer: Self-pay | Admitting: Internal Medicine

## 2012-11-01 DIAGNOSIS — M67919 Unspecified disorder of synovium and tendon, unspecified shoulder: Secondary | ICD-10-CM | POA: Diagnosis not present

## 2012-11-07 DIAGNOSIS — M67919 Unspecified disorder of synovium and tendon, unspecified shoulder: Secondary | ICD-10-CM | POA: Diagnosis not present

## 2012-11-07 DIAGNOSIS — M719 Bursopathy, unspecified: Secondary | ICD-10-CM | POA: Diagnosis not present

## 2012-11-15 DIAGNOSIS — M67919 Unspecified disorder of synovium and tendon, unspecified shoulder: Secondary | ICD-10-CM | POA: Diagnosis not present

## 2012-11-15 DIAGNOSIS — M719 Bursopathy, unspecified: Secondary | ICD-10-CM | POA: Diagnosis not present

## 2012-11-18 DIAGNOSIS — H43819 Vitreous degeneration, unspecified eye: Secondary | ICD-10-CM | POA: Diagnosis not present

## 2012-11-29 ENCOUNTER — Other Ambulatory Visit: Payer: Self-pay | Admitting: *Deleted

## 2012-11-29 MED ORDER — AMITRIPTYLINE HCL 25 MG PO TABS: 25.0000 mg | ORAL_TABLET | Freq: Every day | ORAL | Status: DC

## 2012-12-02 ENCOUNTER — Other Ambulatory Visit: Payer: Self-pay | Admitting: *Deleted

## 2012-12-02 DIAGNOSIS — E785 Hyperlipidemia, unspecified: Secondary | ICD-10-CM

## 2012-12-02 MED ORDER — ATORVASTATIN CALCIUM 40 MG PO TABS: 40.0000 mg | ORAL_TABLET | Freq: Every day | ORAL | Status: DC

## 2012-12-04 NOTE — Assessment & Plan Note (Signed)
Lab Results  Component Value Date   CHOL 192 04/14/2011   CHOL 257* 01/30/2011   CHOL 141 05/21/2010   Lab Results  Component Value Date   HDL 38* 04/14/2011   HDL 38* 01/30/2011   HDL 37* 05/21/2010   Lab Results  Component Value Date   LDLCALC 127* 04/14/2011   LDLCALC 194* 01/30/2011   LDLCALC 79 05/21/2010   Lab Results  Component Value Date   TRIG 135 04/14/2011   TRIG 124 01/30/2011   TRIG 124 05/21/2010   Lab Results  Component Value Date   CHOLHDL 5.1 04/14/2011   CHOLHDL 6.8 01/30/2011   CHOLHDL 3.8 Ratio 05/21/2010   No results found for this basename: LDLDIRECT   He is due for a FLP but he has not been taking his pravastatin because of cost.  He states that it is more expensive now with his insurance.  We will switch him to lipitor since is the perferred on is insurance and follow up in 3 months.

## 2012-12-04 NOTE — Assessment & Plan Note (Signed)
HE has mild COPD by his PFTs done on June of 2011.  He is still active but notices that he continues to be short of breath with rigorous activity.  We will start him on symbicort today for his symptoms.

## 2012-12-04 NOTE — Assessment & Plan Note (Signed)
He states that he has been using the amitriptyline with valerian root and this has been working well for his sleeping.  HE has mild dry mouth likely secondary to the amitriptyline.    We will continue his current regimen for his sleep since it seems to be working well.

## 2012-12-04 NOTE — Assessment & Plan Note (Signed)
He hasn't started the symbicort because he got the free trial and read the warnings and stopped because of the cataract warning.  He recently had his cataracts removed and was going to start the medication after that.  He states that it was expensive.    I encourage him to start the medication and to check with his pharmacy as to which is the preferred medication on his insurance plan.

## 2012-12-04 NOTE — Assessment & Plan Note (Signed)
He states that over the last week he has noted chest tightness and greenish production from his cough.  He denies increased coughing, sore throat, SOB, fevers, or chills.  He states that he has had some nasal "stuffiness."    He has borderline COPD and hasn't been using his medications other then PRN.  We will have him use his albuterol inhaler as well as OTC saline nasal spray to help clear his drainage from his nose.

## 2012-12-04 NOTE — Assessment & Plan Note (Signed)
He has a history of reflux and has been taking his protonix.  HE is due for a refill today.  He denies abdominal pain, nausea, or vomiting with the medication.  We will refill it today.

## 2012-12-04 NOTE — Assessment & Plan Note (Addendum)
He states that he has been taking 1/2 of the [place] and if that doesn't work he takes  Second half an hour later.  He states that when he wakes up in the morning he feels "dopey."  He also states that he notices that he has been eating more at nighttime.  Sometimes he doesn't remember eating but there are crumbs on him in the morning.  We will stop the ambien for now and start him on amitriptyline 25 mg at bedtime.  He was reminded to call if it doesn't help at all or if he has side effects. We will check an EKG for prolonged QT before we start the amitriptyline.  EKG shows a left bundle branch block with a normal QTc

## 2012-12-28 DIAGNOSIS — H04129 Dry eye syndrome of unspecified lacrimal gland: Secondary | ICD-10-CM | POA: Diagnosis not present

## 2012-12-29 ENCOUNTER — Other Ambulatory Visit: Payer: Self-pay | Admitting: Internal Medicine

## 2012-12-29 DIAGNOSIS — E785 Hyperlipidemia, unspecified: Secondary | ICD-10-CM

## 2012-12-29 NOTE — Telephone Encounter (Signed)
Please have patient come in as soon as possible for labs (lipid panel and CMP), and schedule an appointment with his PCP.

## 2013-01-05 ENCOUNTER — Encounter: Payer: Self-pay | Admitting: *Deleted

## 2013-01-05 ENCOUNTER — Other Ambulatory Visit (INDEPENDENT_AMBULATORY_CARE_PROVIDER_SITE_OTHER): Payer: Medicare Other

## 2013-01-05 DIAGNOSIS — E785 Hyperlipidemia, unspecified: Secondary | ICD-10-CM | POA: Diagnosis not present

## 2013-01-05 NOTE — Progress Notes (Unsigned)
Pt presents for lab work today and ask about his urine being a dark yellow in color, denies pain, burning, freq, hesitation, foul odor, blood in urine. He is advised to increase po fluids and offered an appt, he states he has appt w/ dr Mikey Bussing 8/28 and can wait until then to speak to md about his concern. He is advised that if he changes his mind or any other symptoms occur to please call triage and an appt will be made for quicker eval of the problem. He is agreeable

## 2013-01-06 LAB — LIPID PANEL
Cholesterol: 121 mg/dL (ref 0–200)
HDL: 34 mg/dL — ABNORMAL LOW (ref >39)
LDL Cholesterol: 55 mg/dL (ref 0–99)
Triglycerides: 160 mg/dL — ABNORMAL HIGH (ref <150)

## 2013-01-06 LAB — COMPLETE METABOLIC PANEL WITH GFR
ALT: 18 U/L (ref 0–53)
BUN: 13 mg/dL (ref 6–23)
CO2: 28 mEq/L (ref 19–32)
Calcium: 9.4 mg/dL (ref 8.4–10.5)
Chloride: 105 mEq/L (ref 96–112)
Creat: 1.26 mg/dL (ref 0.50–1.35)
GFR, Est African American: 66 mL/min
GFR, Est Non African American: 57 mL/min — ABNORMAL LOW
Glucose, Bld: 99 mg/dL (ref 70–99)
Total Bilirubin: 0.7 mg/dL (ref 0.3–1.2)

## 2013-01-30 ENCOUNTER — Other Ambulatory Visit: Payer: Self-pay | Admitting: Internal Medicine

## 2013-02-02 ENCOUNTER — Ambulatory Visit (INDEPENDENT_AMBULATORY_CARE_PROVIDER_SITE_OTHER): Payer: Medicare Other | Admitting: Internal Medicine

## 2013-02-02 ENCOUNTER — Encounter: Payer: Self-pay | Admitting: Internal Medicine

## 2013-02-02 DIAGNOSIS — R799 Abnormal finding of blood chemistry, unspecified: Secondary | ICD-10-CM

## 2013-02-02 DIAGNOSIS — N182 Chronic kidney disease, stage 2 (mild): Secondary | ICD-10-CM | POA: Insufficient documentation

## 2013-02-02 DIAGNOSIS — IMO0002 Reserved for concepts with insufficient information to code with codable children: Secondary | ICD-10-CM

## 2013-02-02 DIAGNOSIS — Z860100 Personal history of colon polyps, unspecified: Secondary | ICD-10-CM

## 2013-02-02 DIAGNOSIS — J4489 Other specified chronic obstructive pulmonary disease: Secondary | ICD-10-CM | POA: Diagnosis not present

## 2013-02-02 DIAGNOSIS — G4700 Insomnia, unspecified: Secondary | ICD-10-CM | POA: Diagnosis not present

## 2013-02-02 DIAGNOSIS — R7989 Other specified abnormal findings of blood chemistry: Secondary | ICD-10-CM

## 2013-02-02 DIAGNOSIS — J449 Chronic obstructive pulmonary disease, unspecified: Secondary | ICD-10-CM | POA: Diagnosis not present

## 2013-02-02 DIAGNOSIS — E785 Hyperlipidemia, unspecified: Secondary | ICD-10-CM

## 2013-02-02 DIAGNOSIS — F5101 Primary insomnia: Secondary | ICD-10-CM

## 2013-02-02 NOTE — Assessment & Plan Note (Addendum)
Patient still having trouble falling asleep at night.  Although patient has only been taking 12.5mg  of Amitriptyline.  Discussed with patient to take 1 full 25mg  pill of Amitriptyline and to take it a few hours earlier than he has been to see if that helps since he is getting 8 hours of sleep, just later than he would like.  Patient instructed if he still does not have improvement he can try taking 2 tabs (50mg ) to see if that improves insomnia.  Patient reported that he would call to let me know how he is doing with that plan.

## 2013-02-02 NOTE — Assessment & Plan Note (Addendum)
Patient reports upcoming appointment with [name] of [facility] and will discuss date of upcoming colonoscopy.

## 2013-02-02 NOTE — Assessment & Plan Note (Addendum)
Patient at goal. Continue current Tx.

## 2013-02-02 NOTE — Assessment & Plan Note (Addendum)
Mod COPD, Patient with Albuterol Inhaler and started on Symbicort at last visit.  Reports doing very well with current medications although is concerned about cost.  I discussed with him that Spiriva may be cheaper but he does not want to change medications at this time. Will continue current treatment plan. Will get Flu shot today.

## 2013-02-02 NOTE — Assessment & Plan Note (Signed)
Patient's CMP on 01/02/13 showed increased creatinine of 1.26 from 0.99 in Dec 2011.  Patient also reported some dark urine at that time.  He reported that he does a lot of yard work and doesn't always drink enough water.  He was encouraged to drink more water when working outside.  Will repeat BMP today to assess Creatinine.  Will also obtain U/A to assess for RBC or other abnormality that may explain elevation.

## 2013-02-02 NOTE — Progress Notes (Signed)
Subjective:   Patient ID: [name] male   DOB: 1941/04/20 72 y.o.   MRN: [id number]  HPI: [name] is a 72 y.o. male with PMH of COPD, HLD, GERD, Insomnia, and Colon Adenoma.  He presents today for regular follow up and to establish with new PCP.  He reports he is feeling very well and his only complaint is continued insomnia.   He reports that 3 years ago he stopped drinking beer which used to help him fall asleep.  Since then he as struggled with insomnia.  [name] has tried many different medications with only partial success.  Currently he reports taking 1/2 of 25 mg Amitriptyline at around 8-10pm, he reports that many times he cannot fall asleep till around 3am and will wake up at 1pm.  He does report getting 8 hours of sleep regularly. For his COPD he has been using Symbicort 1-2 times a day and has had good success, he denies any SOB or other issues and has not needed his albuterol inhaler. Patient has history of Adenoma of colon sees [name] at [place], is due for repeat colonoscopy which he reports he is discussing with [name].    Past Medical History  Diagnosis Date  . Hyperlipidemia   . 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 GERD (gastroesophageal reflux disease)   . Colonic polyp     Tubulovillous adenomatous  . Tobacco abuse     quit 2001  . Alcohol abuse   . Gout 12/2007    inadequate aspiration of MTP joint left great toe  06/18/08; -fluid:  red, bloody, cell count not performed (TNP) and no crystals or organisms seen.  [name] COPD (chronic obstructive pulmonary disease)    Current Outpatient Prescriptions  Medication Sig Dispense Refill  . albuterol (PROVENTIL HFA;VENTOLIN HFA) 108 (90 BASE) MCG/ACT inhaler Inhale 2 puffs into the lungs every 6 (six) hours as needed for wheezing.  1 Inhaler  1  . amitriptyline (ELAVIL) 25 MG tablet Take 1 tablet (25 mg total) by mouth at bedtime.  30 tablet  5  . atorvastatin (LIPITOR) 40 MG tablet TAKE ONE TABLET BY MOUTH ONCE DAILY  30  tablet  0  . budesonide-formoterol (SYMBICORT) 160-4.5 MCG/ACT inhaler Inhale 2 puffs into the lungs 2 (two) times daily.  1 Inhaler  12  . chlorhexidine (PERIDEX) 0.12 % solution       . pantoprazole (PROTONIX) 40 MG tablet Take 1 tablet (40 mg total) by mouth daily.  30 tablet  6   No current facility-administered medications for this visit.   History reviewed. No pertinent family history. History   Social History  . Marital Status: Married    Spouse Name: N/A    Number of Children: N/A  . Years of Education: N/A   Social History Main Topics  . Smoking status: Former Smoker    Types: Cigarettes    Quit date: 08/27/2000  . Smokeless tobacco: None  . Alcohol Use: No  . Drug Use: None  . Sexual Activity: None   Other Topics Concern  . None   Social History Narrative  . None   Review of Systems: Review of Systems  Constitutional: Negative for fever, chills and weight loss.  HENT: Negative for congestion.   Eyes: Negative for blurred vision.  Respiratory: Negative for cough and shortness of breath.   Cardiovascular: Negative for chest pain, palpitations and leg swelling.  Gastrointestinal: Positive for heartburn. Negative for nausea, vomiting, abdominal pain, diarrhea and constipation.  Genitourinary: Negative for dysuria and frequency.  Musculoskeletal: Negative for myalgias.  Neurological: Negative for dizziness, tingling and weakness.  Psychiatric/Behavioral: Negative for depression.    Objective:  Physical Exam: There were no vitals filed for this visit. Physical Exam  Nursing note and vitals reviewed. Constitutional: He is oriented to person, place, and time and well-developed, well-nourished, and in no distress. No distress.  HENT:  Head: Normocephalic and atraumatic.  Eyes: Conjunctivae are normal.  Cardiovascular: Normal rate, regular rhythm and normal heart sounds.   No murmur heard. Pulmonary/Chest: Effort normal and breath sounds normal. No respiratory  distress. He has no wheezes. He has no rales.  Abdominal: Soft. Bowel sounds are normal. He exhibits no distension. There is no tenderness.  Musculoskeletal: He exhibits no edema.  Neurological: He is alert and oriented to person, place, and time.  Skin: Skin is warm and dry. He is not diaphoretic.  Psychiatric: Mood, memory, affect and judgment normal.    Assessment & Plan:   See problem based Assessment and Plan

## 2013-02-02 NOTE — Patient Instructions (Signed)
1.  Try taking 1 tablet (25mg ) of Amitriptyline earlier in the day to help with insomnia.  If that is not effective you can try to take 2 tablets (50mg ), and call to let me know if that is working and we can change the prescription. 2.  I will call you about the lab results from your visit today. 3.  Continue to try to use Symbicort 1-2 times a day. 4.  Please continue to follow up with [name] for your Colonoscopy.

## 2013-02-03 LAB — URINALYSIS, ROUTINE W REFLEX MICROSCOPIC
Ketones, ur: NEGATIVE mg/dL
Leukocytes, UA: NEGATIVE
Nitrite: NEGATIVE
Specific Gravity, Urine: 1.021 (ref 1.005–1.030)
Urobilinogen, UA: 0.2 mg/dL (ref 0.0–1.0)
pH: 5 (ref 5.0–8.0)

## 2013-02-03 LAB — BASIC METABOLIC PANEL WITH GFR
BUN: 19 mg/dL (ref 6–23)
Chloride: 104 mEq/L (ref 96–112)
GFR, Est African American: 74 mL/min
GFR, Est Non African American: 64 mL/min
Potassium: 4.4 mEq/L (ref 3.5–5.3)

## 2013-02-05 NOTE — Progress Notes (Signed)
I saw and evaluated the patient.  I personally confirmed the key portions of the history and exam documented by Dr. Mikey Bussing and I reviewed pertinent patient test results.  The assessment, diagnosis, and plan were formulated together and I agree with the documentation in the resident's note. If elavil does not improve his sleep, consideration of melatonin.

## 2013-02-10 ENCOUNTER — Telehealth: Payer: Self-pay | Admitting: *Deleted

## 2013-02-10 NOTE — Telephone Encounter (Signed)
Pt left message on [facility] recording to have Dr Mikey Bussing call about test results and to check if form is completed - only have 1 page starting with question # 14. Stanton Kidney Colleen Kotlarz RN 02/10/13 2:45PM

## 2013-02-11 NOTE — Telephone Encounter (Signed)
Called patient back and discussed lab results.  Patient reports that he did drop off his paperwork to be filled out after his visit (he had originally only dropped off 1 page at his visit).  He reports he gave this to the receptionist to be placed in my box. I did not see the extra pages in my box the last time I checked on Thursday.  I told patient I would follow up with the receptionist on Monday and if I am unable to locate the paperwork I will call back and have him bring another copy.

## 2013-02-15 NOTE — Telephone Encounter (Signed)
Unable to find remainder of paperwork.  Called patient back and ask that he call insurance company and have them fax it to our clinic if possible.  Patient reported he would do so.

## 2013-02-28 ENCOUNTER — Other Ambulatory Visit: Payer: Self-pay | Admitting: Internal Medicine

## 2013-05-16 ENCOUNTER — Other Ambulatory Visit: Payer: Self-pay | Admitting: *Deleted

## 2013-05-17 MED ORDER — PANTOPRAZOLE SODIUM 40 MG PO TBEC: 40.0000 mg | DELAYED_RELEASE_TABLET | Freq: Every day | ORAL | Status: DC

## 2013-06-08 DIAGNOSIS — R410 Disorientation, unspecified: Secondary | ICD-10-CM

## 2013-06-08 HISTORY — DX: Disorientation, unspecified: R41.0

## 2013-07-04 ENCOUNTER — Other Ambulatory Visit: Payer: Self-pay | Admitting: *Deleted

## 2013-07-05 MED ORDER — AMITRIPTYLINE HCL 25 MG PO TABS: 25.0000 mg | ORAL_TABLET | Freq: Every day | ORAL | Status: DC

## 2013-07-12 DIAGNOSIS — D235 Other benign neoplasm of skin of trunk: Secondary | ICD-10-CM | POA: Diagnosis not present

## 2013-07-12 DIAGNOSIS — L570 Actinic keratosis: Secondary | ICD-10-CM | POA: Diagnosis not present

## 2013-07-12 DIAGNOSIS — L821 Other seborrheic keratosis: Secondary | ICD-10-CM | POA: Diagnosis not present

## 2013-07-27 ENCOUNTER — Encounter: Payer: Self-pay | Admitting: Internal Medicine

## 2013-07-27 ENCOUNTER — Ambulatory Visit (INDEPENDENT_AMBULATORY_CARE_PROVIDER_SITE_OTHER): Payer: Medicare Other | Admitting: Internal Medicine

## 2013-07-27 VITALS — BP 129/83 | HR 75 | Temp 97.2°F | Ht 66.0 in | Wt 146.9 lb

## 2013-07-27 DIAGNOSIS — F5101 Primary insomnia: Secondary | ICD-10-CM

## 2013-07-27 DIAGNOSIS — E785 Hyperlipidemia, unspecified: Secondary | ICD-10-CM | POA: Diagnosis not present

## 2013-07-27 DIAGNOSIS — Z0000 Encounter for general adult medical examination without abnormal findings: Secondary | ICD-10-CM | POA: Insufficient documentation

## 2013-07-27 DIAGNOSIS — G4700 Insomnia, unspecified: Secondary | ICD-10-CM | POA: Diagnosis not present

## 2013-07-27 DIAGNOSIS — R799 Abnormal finding of blood chemistry, unspecified: Secondary | ICD-10-CM | POA: Diagnosis not present

## 2013-07-27 DIAGNOSIS — Z86010 Personal history of colonic polyps: Secondary | ICD-10-CM | POA: Diagnosis not present

## 2013-07-27 DIAGNOSIS — J449 Chronic obstructive pulmonary disease, unspecified: Secondary | ICD-10-CM | POA: Diagnosis not present

## 2013-07-27 MED ORDER — CHLORHEXIDINE GLUCONATE 0.12 % MT SOLN: 15.0000 mL | Freq: Four times a day (QID) | OROMUCOSAL | Status: DC | PRN

## 2013-07-27 NOTE — Assessment & Plan Note (Signed)
The problem of recurrent insomnia is discussed. Avoidance of caffeine sources is strongly encouraged. Sleep hygiene issues are reviewed and he was encouraged to use bedroom only for sleep, no eating, TV, or computer use in the bedroom.  He reports he has an extra bedroom and will try to start sleeping in there. He was instructed to continue amitriptyline for now.

## 2013-07-27 NOTE — Patient Instructions (Signed)
Go to bed at the same time each night and rise at the same time each morning. Make sure your bedroom is a quiet, dark, and relaxing environment, which is neither too hot or too cold. Make sure your bed is comfortable and use it only for sleeping and not for other activities, such as reading, watching TV, or listening to music. Remove all TVs, computers, and other "gadgets" from the bedroom. Avoid large meals before bedtime.  **Please follow up with Dr. Maurene Capes for your Colonoscopy.

## 2013-07-27 NOTE — Progress Notes (Signed)
[facility]-  Subjective:   Patient ID: [name] male   DOB: 1940/07/11 73 y.o.   MRN: [id number]  HPI: [name] is a 73 y.o. male with PMH of COPD, HLD, GERD, Insomnia, and Colon Adenoma.  He presets today for a yearly physical. He reports that overall he fells well except for his insomnia.  He continues to try to take 25 mg Amitriptyline at varying times before bed.  He notes that no matter when he takes the pill he always tries to go to bed around 10-11 but cannot fall asleep until 2-4am.  He is able to get 8 hours of sleep but this interferes with normal AM appointments. He reports his room is has heavy curtains and he keeps the temperature at 69 degrees.  He admits that he has a television and computer in his room and frequently eats meals in his bedroom.  He typically watches TV before bed.  He is still using Symbicort 1-2 times a day and has had good success, he denies any SOB.  Patient has history of Adenoma of colon sees Dr. Maurene Capes at [place], is due for repeat colonoscopy which he reports he is trying to get up the nerve.    Past Medical History  Diagnosis Date  . Hyperlipidemia   . 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 GERD (gastroesophageal reflux disease)   . Colonic polyp     Tubulovillous adenomatous  . Tobacco abuse     quit 2001  . Alcohol abuse   . Gout 12/2007    inadequate aspiration of MTP joint left great toe  06/18/08; -fluid:  red, bloody, cell count not performed (TNP) and no crystals or organisms seen.  [name] COPD (chronic obstructive pulmonary disease)    Current Outpatient Prescriptions  Medication Sig Dispense Refill  . albuterol (PROVENTIL HFA;VENTOLIN HFA) 108 (90 BASE) MCG/ACT inhaler Inhale 2 puffs into the lungs every 6 (six) hours as needed for wheezing.  1 Inhaler  1  . amitriptyline (ELAVIL) 25 MG tablet Take 1 tablet (25 mg total) by mouth at bedtime.  30 tablet  5  . atorvastatin (LIPITOR) 40 MG tablet TAKE ONE TABLET BY MOUTH ONCE  DAILY  30 tablet  5  . budesonide-formoterol (SYMBICORT) 160-4.5 MCG/ACT inhaler Inhale 2 puffs into the lungs 2 (two) times daily.  1 Inhaler  12  . chlorhexidine (PERIDEX) 0.12 % solution       . pantoprazole (PROTONIX) 40 MG tablet Take 1 tablet (40 mg total) by mouth daily.  30 tablet  6   No current facility-administered medications for this visit.   No family history on file. History   Social History  . Marital Status: Married    Spouse Name: N/A    Number of Children: N/A  . Years of Education: N/A   Social History Main Topics  . Smoking status: Former Smoker    Types: Cigarettes    Quit date: 08/27/2000  . Smokeless tobacco: None  . Alcohol Use: No  . Drug Use: None  . Sexual Activity: None   Other Topics Concern  . None   Social History Narrative  . None   Review of Systems: Review of Systems  Constitutional: Negative for fever, chills and weight loss.  HENT: Negative for congestion and sore throat.   Eyes: Negative for blurred vision.  Respiratory: Negative for cough and shortness of breath.   Cardiovascular: Negative for chest pain, palpitations and leg swelling.  Gastrointestinal: Negative for heartburn, nausea,  vomiting, abdominal pain, diarrhea and constipation.  Genitourinary: Negative for dysuria and frequency.       Gets up 1-2 a night  Musculoskeletal: Negative for myalgias.  Skin: Negative for rash.  Neurological: Negative for dizziness, tingling, weakness and headaches.  Psychiatric/Behavioral: Negative for depression. The patient has insomnia.     Objective:  Physical Exam: Filed Vitals:   07/27/13 1454  BP: 129/83  Pulse: 75  Temp: 97.2 F (36.2 C)  TempSrc: Oral  Height: 5\' 6"  (1.676 m)  Weight: 146 lb 14.4 oz (66.633 kg)  SpO2: 98%   Physical Exam  Nursing note and vitals reviewed. Constitutional: He is oriented to person, place, and time and well-developed, well-nourished, and in no distress. No distress.  HENT:  Head:  Normocephalic and atraumatic.  Eyes: Conjunctivae are normal.  Cardiovascular: Normal rate, regular rhythm and normal heart sounds.   No murmur heard. Pulmonary/Chest: Effort normal and breath sounds normal. No respiratory distress. He has no wheezes. He has no rales.  Abdominal: Soft. Bowel sounds are normal. He exhibits no distension. There is no tenderness.  Musculoskeletal: He exhibits no edema.  Neurological: He is alert and oriented to person, place, and time.  Skin: Skin is warm and dry. He is not diaphoretic.  Psychiatric: Mood, memory, affect and judgment normal.    Assessment & Plan:   See problem based Assessment and Plan

## 2013-07-27 NOTE — Assessment & Plan Note (Addendum)
PSA: The natural history of prostate cancer and ongoing controversy regarding screening and potential treatment outcomes of prostate cancer has been discussed with the patient. The meaning of a false positive PSA and a false negative PSA has been discussed. He indicates understanding of the limitations of this screening test and wishes not to proceed with screening PSA testing.  Colonoscopy: He is due for repeat colonoscopy.  He had a previous traumatic experience with intestinal perforation. He reports he is willing to go through with this but that it may be the last one he wishes to do.  Pneumovax: He is uptodate.  Influenza: patient declined.  Zostavax: He has received in 2013.

## 2013-07-31 NOTE — Progress Notes (Signed)
Case discussed with Dr. Hoffman at time of visit. We reviewed the resident's history and exam and pertinent patient test results. I agree with the assessment, diagnosis, and plan of care documented in the resident's note. 

## 2013-09-18 ENCOUNTER — Encounter: Payer: Self-pay | Admitting: Internal Medicine

## 2013-10-14 ENCOUNTER — Other Ambulatory Visit: Payer: Self-pay | Admitting: Internal Medicine

## 2013-11-01 ENCOUNTER — Ambulatory Visit (AMBULATORY_SURGERY_CENTER): Payer: Self-pay

## 2013-11-01 ENCOUNTER — Telehealth: Payer: Self-pay | Admitting: Internal Medicine

## 2013-11-01 VITALS — Ht 66.0 in | Wt 140.6 lb

## 2013-11-01 DIAGNOSIS — Z860100 Personal history of colon polyps, unspecified: Secondary | ICD-10-CM

## 2013-11-01 MED ORDER — MOVIPREP 100 G PO SOLR: 1.0000 | Freq: Once | ORAL | Status: DC

## 2013-11-01 NOTE — Telephone Encounter (Signed)
Miralax prep explained.  Pt voiced understanding.

## 2013-11-01 NOTE — Progress Notes (Signed)
No allergies to eggs or soy No past problems with anesthesia No home oxygen No diet/weight loss meds Has email  Emmi instructions on colonoscopy

## 2013-11-09 DIAGNOSIS — Z961 Presence of intraocular lens: Secondary | ICD-10-CM | POA: Diagnosis not present

## 2013-11-09 DIAGNOSIS — H43819 Vitreous degeneration, unspecified eye: Secondary | ICD-10-CM | POA: Diagnosis not present

## 2013-11-09 DIAGNOSIS — H01009 Unspecified blepharitis unspecified eye, unspecified eyelid: Secondary | ICD-10-CM | POA: Diagnosis not present

## 2013-11-15 ENCOUNTER — Encounter: Payer: Self-pay | Admitting: Internal Medicine

## 2013-11-15 ENCOUNTER — Ambulatory Visit (AMBULATORY_SURGERY_CENTER): Payer: Medicare Other | Admitting: Internal Medicine

## 2013-11-15 VITALS — BP 137/65 | HR 62 | Temp 97.1°F | Resp 14 | Ht 66.0 in | Wt 140.0 lb

## 2013-11-15 DIAGNOSIS — J449 Chronic obstructive pulmonary disease, unspecified: Secondary | ICD-10-CM | POA: Diagnosis not present

## 2013-11-15 DIAGNOSIS — D126 Benign neoplasm of colon, unspecified: Secondary | ICD-10-CM

## 2013-11-15 DIAGNOSIS — Z86010 Personal history of colonic polyps: Secondary | ICD-10-CM | POA: Diagnosis not present

## 2013-11-15 DIAGNOSIS — J61 Pneumoconiosis due to asbestos and other mineral fibers: Secondary | ICD-10-CM | POA: Diagnosis not present

## 2013-11-15 MED ORDER — SODIUM CHLORIDE 0.9 % IV SOLN: 500.0000 mL | INTRAVENOUS | Status: DC

## 2013-11-15 NOTE — Progress Notes (Signed)
Report to PACU, RN, vss, BBS= Clear.  

## 2013-11-15 NOTE — Op Note (Signed)
[place]  [address]. [place], [id number]   COLONOSCOPY PROCEDURE REPORT  PATIENT: [name], [name]  MR#: [id number] BIRTHDATE: 26-Mar-1941 , 73  yrs. old GENDER: Male ENDOSCOPIST: Lafayette Dragon, MD REFERRED BY:Dr Annamary Rummage PROCEDURE DATE:  11/15/2013 PROCEDURE:   Colonoscopy with biopsy First Screening Colonoscopy - Avg.  risk and is 50 yrs.  old or older - No.  Prior Negative Screening - Now for repeat screening. N/A  History of Adenoma - Now for follow-up colonoscopy & has been > or = to 3 yrs.  Yes hx of adenoma.  Has been 3 or more years since last colonoscopy.  Polyps Removed Today? Yes. ASA CLASS:   Class II INDICATIONS:history of adenomatous polyp at the hepatic flexure on colonoscopy in 9509 complicated by post polypectomy Barna.  Last colonoscopy in June 2001 from all persistent polyp all in the hepatic flexure which was a villous adenoma.  Patient declined surgery.  This is a repeat colonoscopy with the attempt at polypectomy. MEDICATIONS: MAC sedation, administered by CRNA and propofol (Diprivan) 250mg  IV  DESCRIPTION OF PROCEDURE:   After the risks benefits and alternatives of the procedure were thoroughly explained, informed consent was obtained.  A digital rectal exam revealed no abnormalities of the rectum.   The LB [id number]  endoscope was introduced through the anus and advanced to the cecum, which was identified by both the appendix and ileocecal valve. No adverse events experienced.   The quality of the prep was good, using MoviPrep  The instrument was then slowly withdrawn as the colon was fully examined.      COLON FINDINGS: A large polypoid shaped sessile polyp, measuring 3 cm in size, was found at the hepatic flexure.  Multiple biopsies were performed using cold forceps.polyps breath and around the circumference of the colon lumen and it was so impossible to remove it entirely. Multiple biopsies were obtained from  different size of the polyp. Total of 5 cc of SPOT tattoo was applied to the mucosa surrounding the polyp.  Retroflexion was not performed due to a narrow rectal vault. The time to cecum=5 minutes 37 seconds. Withdrawal time=16 minutes 29 seconds.  The scope was withdrawn and the procedure completed. COMPLICATIONS: There were no complications.  ENDOSCOPIC IMPRESSION: Large sessile polyp, measuring 3 cm in size, was found at the hepatic flexure; multiple biopsies were performed using cold forceps , this is the identical polyp  which was  seen on 2 prior colonoscopies, attemted polypectomy in 2003  resulted in a burn/near perforation, it appears to be larger than on prior colonoscopy in 2010  RECOMMENDATIONS: 1.  Await pathology results 2.   this is a large sessile polyp, which will have to be removed surgically.  It has been observed now for several years since patient declined surgery after a complicated of colonoscopy in 2003 but I believe it is progressing to become malignant and should no longer be observed .I would not attempt to remove it because of prior complication.  The mucosal burn and anemia perforation.   eSigned:  Lafayette Dragon, MD 11/15/2013 1:24 PM   cc:   PATIENT NAME:  [name], [name] MR#: [id number]

## 2013-11-15 NOTE — Patient Instructions (Signed)
YOU HAD AN ENDOSCOPIC PROCEDURE TODAY AT THE [facility]: Refer to the procedure report that was given to you for any specific questions about what was found during the examination.  If the procedure report does not answer your questions, please call your gastroenterologist to clarify.  If you requested that your care partner not be given the details of your procedure findings, then the procedure report has been included in a sealed envelope for you to review at your convenience later.  YOU SHOULD EXPECT: Some feelings of bloating in the abdomen. Passage of more gas than usual.  Walking can help get rid of the air that was put into your GI tract during the procedure and reduce the bloating. If you had a lower endoscopy (such as a colonoscopy or flexible sigmoidoscopy) you may notice spotting of blood in your stool or on the toilet paper. If you underwent a bowel prep for your procedure, then you may not have a normal bowel movement for a few days.  DIET: Your first meal following the procedure should be a light meal and then it is ok to progress to your normal diet.  A half-sandwich or bowl of soup is an example of a good first meal.  Heavy or fried foods are harder to digest and may make you feel nauseous or bloated.  Likewise meals heavy in dairy and vegetables can cause extra gas to form and this can also increase the bloating.  Drink plenty of fluids but you should avoid alcoholic beverages for 24 hours.  ACTIVITY: Your care partner should take you home directly after the procedure.  You should plan to take it easy, moving slowly for the rest of the day.  You can resume normal activity the day after the procedure however you should NOT DRIVE or use heavy machinery for 24 hours (because of the sedation medicines used during the test).    SYMPTOMS TO REPORT IMMEDIATELY: A gastroenterologist can be reached at any hour.  During normal business hours, 8:30 AM to 5:00 PM Monday through Friday,  call [phone].  After hours and on weekends, please call the GI answering service at [phone] who will take a message and have the physician on call contact you.   Following lower endoscopy (colonoscopy or flexible sigmoidoscopy):  Excessive amounts of blood in the stool  Significant tenderness or worsening of abdominal pains  Swelling of the abdomen that is new, acute  Fever of 100F or higher  Following upper endoscopy (EGD)  Vomiting of blood or coffee ground material  New chest pain or pain under the shoulder blades  Painful or persistently difficult swallowing  New shortness of breath  Fever of 100F or higher  Black, tarry-looking stools  FOLLOW UP: If any biopsies were taken you will be contacted by phone or by letter within the next 1-3 weeks.  Call your gastroenterologist if you have not heard about the biopsies in 3 weeks.  Our staff will call the home number listed on your records the next business day following your procedure to check on you and address any questions or concerns that you may have at that time regarding the information given to you following your procedure. This is a courtesy call and so if there is no answer at the home number and we have not heard from you through the emergency physician on call, we will assume that you have returned to your regular daily activities without incident.  SIGNATURES/CONFIDENTIALITY: You and/or your care   partner have signed paperwork which will be entered into your electronic medical record.  These signatures attest to the fact that that the information above on your After Visit Summary has been reviewed and is understood.  Full responsibility of the confidentiality of this discharge information lies with you and/or your care-partner.   Await pathology results

## 2013-11-15 NOTE — Progress Notes (Signed)
Called to room to assist during endoscopic procedure.  Patient ID and intended procedure confirmed with present staff. Received instructions for my participation in the procedure from the performing physician.  

## 2013-11-16 ENCOUNTER — Telehealth: Payer: Self-pay

## 2013-11-16 NOTE — Telephone Encounter (Signed)
  Follow up Call-  Call back number 11/15/2013  Post procedure Call Back phone  # [phone]  Permission to leave phone message Yes     Patient questions:  Do you have a fever, pain , or abdominal swelling? no Pain Score  0 *  Have you tolerated food without any problems? yes  Have you been able to return to your normal activities? yes  Do you have any questions about your discharge instructions: Diet   no Medications  no Follow up visit  no  Do you have questions or concerns about your Care? no  Actions: * If pain score is 4 or above: No action needed, pain <4.

## 2013-11-20 ENCOUNTER — Encounter: Payer: Self-pay | Admitting: Internal Medicine

## 2013-11-21 ENCOUNTER — Encounter: Payer: Self-pay | Admitting: *Deleted

## 2013-11-21 ENCOUNTER — Telehealth: Payer: Self-pay | Admitting: Internal Medicine

## 2013-11-21 NOTE — Telephone Encounter (Signed)
Patient notified of results as per letter. Scheduled OV with Dr. Olevia Perches on 11/28/13 at 3:45 PM.

## 2013-11-22 ENCOUNTER — Encounter: Payer: Self-pay | Admitting: *Deleted

## 2013-11-28 ENCOUNTER — Encounter: Payer: Self-pay | Admitting: Internal Medicine

## 2013-11-28 ENCOUNTER — Ambulatory Visit (INDEPENDENT_AMBULATORY_CARE_PROVIDER_SITE_OTHER): Payer: Medicare Other | Admitting: Internal Medicine

## 2013-11-28 VITALS — BP 122/68 | HR 76 | Ht 66.0 in | Wt 140.0 lb

## 2013-11-28 DIAGNOSIS — D126 Benign neoplasm of colon, unspecified: Secondary | ICD-10-CM | POA: Diagnosis not present

## 2013-11-28 NOTE — Progress Notes (Signed)
[name] 1940-12-04 [id number]  Note: This dictation was prepared with Dragon digital system. Any transcriptional errors that result from this procedure are unintentional.   History of Present Illness: This is a 73 year old white male here to discuss possible colon surgery. He has had a l sessile carpet polyp at the hepatic flexure of the colon since 2003 and has undergone several colonoscopies with attempts to remove it. The first colonoscopy in 2003 resulted in all causal burn and not near perforation which responded to conservative measures so he never had to undergo her ALT resection. Last colonoscopy was in 2010 again showed villous adenoma without dysplasia which was partially removed but the base of the polyp remained. He at that time again decided to wait for the next colonoscopy before considering segmental resection. Recent most recent colonoscopy in June 2015 showed large multilobulated polyp at the hepatic flexure 3 cm in diameter which was so we moved the instrument multiple fragments it was tattooed and now biopsy showed so tubular adenoma. He is now open to all surgical resection of this polyp which has high propensity to develop into cancer    Past Medical History  Diagnosis Date  . Hyperlipidemia   . 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 GERD (gastroesophageal reflux disease)   . Colonic polyp     Tubulovillous adenomatous  . Tobacco abuse     quit 2001  . Alcohol abuse   . Gout 12/2007    inadequate aspiration of MTP joint left great toe  06/18/08; -fluid:  red, bloody, cell count not performed (TNP) and no crystals or organisms seen.  [name] COPD (chronic obstructive pulmonary disease)     Past Surgical History  Procedure Laterality Date  . Wrist surgery Left   . Colonoscopy    . Dupuytren contracture release    . Cataract extraction      No Known Allergies  Family history and social history have been reviewed.  Review of Systems:   The remainder of the 10 point ROS is negative  except as outlined in the H&P  Physical Exam: General Appearance Well developed, in no distress Psychological Normal mood and affect  Assessment and Plan:   I had a long discussion with patient and his sister concerning laparoscopically assisted the resection of the colon polyp at the hepatic flexure. He had many questions about the risks of sedation and the length of recovery. He was also concern about his insurance not covering the surgery. He will call us all after obtaining information from his insurance company as to prefer to providers. He is on aware of the fact that that the sessile polyp in the hepatic flexure presents a high risk for all colon cancer and that waiting another several years would adds to the risk of developing cancer. Also the risk factors for surgery increase with age and may prevent him from having an elective surgery later on.    [name] 11/28/2013

## 2013-11-28 NOTE — Patient Instructions (Signed)
Contact your insurance company to find out whether they would cover a laparoscopic assisted partial colectomy (for premalignant colon polyp) by a Psychologist, sport and exercise at [facility] (Dr Excell Seltzer, Dr Zella Richer, Dr Leighton Ruff are physicians here), [facility] or [facility] would be preferable.  Call our office back with this information at [phone]. Ask to speak to [facility].  CC:Dr Joni Reining

## 2013-11-29 ENCOUNTER — Encounter: Payer: Self-pay | Admitting: Internal Medicine

## 2013-11-29 ENCOUNTER — Telehealth: Payer: Self-pay | Admitting: *Deleted

## 2013-11-29 DIAGNOSIS — Z86010 Personal history of colonic polyps: Secondary | ICD-10-CM

## 2013-11-29 NOTE — Telephone Encounter (Signed)
Patient called back and states that his insurance will cover him to see a surgeon at [facility]. I have spoken to [place] at [facility] who has scheduled patient for an appointment with Dr Michael Boston on 12/18/13 @ 9:30 am. Patient to arrive at 9:10 am for appointment. Patient has been advised and verbalizes understanding.

## 2013-12-08 ENCOUNTER — Other Ambulatory Visit: Payer: Self-pay | Admitting: Internal Medicine

## 2013-12-18 ENCOUNTER — Ambulatory Visit (INDEPENDENT_AMBULATORY_CARE_PROVIDER_SITE_OTHER): Payer: Medicare Other | Admitting: Surgery

## 2013-12-18 ENCOUNTER — Encounter (INDEPENDENT_AMBULATORY_CARE_PROVIDER_SITE_OTHER): Payer: Self-pay | Admitting: Surgery

## 2013-12-18 VITALS — BP 126/72 | HR 80 | Temp 97.8°F | Ht 66.0 in | Wt 139.0 lb

## 2013-12-18 DIAGNOSIS — D126 Benign neoplasm of colon, unspecified: Secondary | ICD-10-CM | POA: Diagnosis not present

## 2013-12-18 MED ORDER — METRONIDAZOLE 500 MG PO TABS: 500.0000 mg | ORAL_TABLET | ORAL | Status: DC

## 2013-12-18 MED ORDER — NEOMYCIN SULFATE 500 MG PO TABS
1000.0000 mg | ORAL_TABLET | ORAL | Status: DC
Start: 2013-12-18 — End: 2014-01-18

## 2013-12-18 NOTE — Patient Instructions (Signed)
Please consider the recommendations that we have given you today:  Consider minimally invasive surgery to resect that part of the colon containing the recurrent polyp  See the Handout(s) we have given you.  Please call our office at [phone] if you wish to schedule surgery or if you have further questions / concerns.   Colon Polyps Polyps are lumps of extra tissue growing inside the body. Polyps can grow in the large intestine (colon). Most colon polyps are noncancerous (benign). However, some colon polyps can become cancerous over time. Polyps that are larger than a pea may be harmful. To be safe, caregivers remove and test all polyps. CAUSES  Polyps form when mutations in the genes cause your cells to grow and divide even though no more tissue is needed. RISK FACTORS There are a number of risk factors that can increase your chances of getting colon polyps. They include:  Being older than 50 years.  Family history of colon polyps or colon cancer.  Long-term colon diseases, such as colitis or Crohn disease.  Being overweight.  Smoking.  Being inactive.  Drinking too much alcohol. SYMPTOMS  Most small polyps do not cause symptoms. If symptoms are present, they may include:  Blood in the stool. The stool may look dark red or black.  Constipation or diarrhea that lasts longer than 1 week. DIAGNOSIS People often do not know they have polyps until their caregiver finds them during a regular checkup. Your caregiver can use 4 tests to check for polyps:  Digital rectal exam. The caregiver wears gloves and feels inside the rectum. This test would find polyps only in the rectum.  Barium enema. The caregiver puts a liquid called barium into your rectum before taking X-rays of your colon. Barium makes your colon look white. Polyps are dark, so they are easy to see in the X-ray pictures.  Sigmoidoscopy. A thin, flexible tube (sigmoidoscope) is placed into your rectum. The  sigmoidoscope has a light and tiny camera in it. The caregiver uses the sigmoidoscope to look at the last third of your colon.  Colonoscopy. This test is like sigmoidoscopy, but the caregiver looks at the entire colon. This is the most common method for finding and removing polyps. TREATMENT  Any polyps will be removed during a sigmoidoscopy or colonoscopy. The polyps are then tested for cancer. PREVENTION  To help lower your risk of getting more colon polyps:  Eat plenty of fruits and vegetables. Avoid eating fatty foods.  Do not smoke.  Avoid drinking alcohol.  Exercise every day.  Lose weight if recommended by your caregiver.  Eat plenty of calcium and folate. Foods that are rich in calcium include milk, cheese, and broccoli. Foods that are rich in folate include chickpeas, kidney beans, and spinach. HOME CARE INSTRUCTIONS Keep all follow-up appointments as directed by your caregiver. You may need periodic exams to check for polyps. SEEK MEDICAL CARE IF: You notice bleeding during a bowel movement. Document Released: 02/19/2004 Document Revised: 08/17/2011 Document Reviewed: 08/04/2011 [facility] Patient Information 2015 [place], [place]. This information is not intended to replace advice given to you by your health care provider. Make sure you discuss any questions you have with your health care provider.  COLON BOWEL PREP  Please follow the instructions carefully. It is important to clean out your bowels & take the prescribed antibiotic pills to lower your chances of a wound infection or abscess.    FIVE DAYS PRIOR TO YOUR SURGERY  Stop eating any nuts, popcorn, or fruit with seeds. Stop all fiber supplements such as Metamucil, Citrucel, etc.   Hold taking any blood thinning anticoagulation medication (ex: aspirin, warfarin/Coumadin, Plavix, Xarelto, Eliquis, Pradaxa, etc) as recommended by your  medical/cardiology doctor  Obtain what you need at a pharmacy of your choice: -Filled out prescriptions for your oral antibiotics (Neomycin & Metronidazole)  -A bottle of MiraLax / Glycolax (288g) - no prescription required  -A large bottle of Gatorade / Powerade (64oz)  -Dulcolax tablets (4 tabs) - no prescription required    DAY PRIOR TO SURGERY   7:00am Swallow 4 Dulcolax tablets with some water Drink plenty of clear liquids all day to avoid getting dehydrated (Water, juice, soda, coffee, tea, bouillon, jello, etc.) 10:00am Mix the bottle of MiraLax with the 64-oz bottle of Gatorade.  Drink the Gatorade mixture gradually over the next few hours (8oz glass every 15-30 minutes) until gone. You should finish by 2pm. 2:00pm Take 2 Neomycin 500mg  tablets & 2 Metronidazole 500mg  tablets 3:00pm Take 2 Neomycin 500mg  tablets & 2 Metronidazole 500mg  tablets  Drink plenty of clear liquids all evening to avoid getting dehydrated 10:00pm Take 2 Neomycin 500mg  tablets & 2 Metronidazole 500mg  tablets  Do not eat or drink anything after bedtime (midnight) the night before your surgery.   MORNING OF SURGERY Remember to not to drink or eat anything that morning  Hold or take medications as recommended by the hospital staff at your Preoperative visit  If you have questions or concerns, please call [facility] [phone] during business hours to speak to the clinical staff for advice.  ABDOMINAL SURGERY: POST OP INSTRUCTIONS  1. DIET: Follow a light bland diet the first 24 hours after arrival home, such as soup, liquids, crackers, etc.  Be sure to include lots of fluids daily.  Avoid fast food or heavy meals as your are more likely to get nauseated.  Eat a low fat the next few days after surgery.   2. Take your usually prescribed home medications unless otherwise directed. 3. PAIN CONTROL: a. Pain is best controlled by a usual combination of three different methods  TOGETHER: i. Ice/Heat ii. Over the counter pain medication iii. Prescription pain medication b. Most patients will experience some swelling and bruising around the incisions.  Ice packs or heating pads (30-60 minutes up to 6 times a day) will help. Use ice for the first few days to help decrease swelling and bruising, then switch to heat to help relax tight/sore spots and speed recovery.  Some people prefer to use ice alone, heat alone, alternating between ice & heat.  Experiment to what works for you.  Swelling and bruising can take several weeks to resolve.   c. It is helpful to take an over-the-counter pain medication regularly for the first few weeks.  Choose one of the following that works best for you: i. Naproxen (Aleve, etc)  Two 220mg  tabs twice a day ii. Ibuprofen (Advil, etc) Three 200mg  tabs four times a day (every meal & bedtime) iii. Acetaminophen (Tylenol, etc) 500-650mg  four times a day (every meal & bedtime) d. A  prescription for pain medication (such as oxycodone, hydrocodone, etc) should be given to you upon discharge.  Take your pain medication as  prescribed.  i. If you are having problems/concerns with the prescription medicine (does not control pain, nausea, vomiting, rash, itching, etc), please call us [phone] to see if we need to switch you to a different pain medicine that will work better for you and/or control your side effect better. ii. If you need a refill on your pain medication, please contact your pharmacy.  They will contact our office to request authorization. Prescriptions will not be filled after 5 pm or on week-ends. 4. Avoid getting constipated.  Between the surgery and the pain medications, it is common to experience some constipation.  Increasing fluid intake and taking a fiber supplement (such as Metamucil, Citrucel, FiberCon, MiraLax, etc) 1-2 times a day regularly will usually help prevent this problem from occurring.  A mild laxative (prune juice, Milk  of Magnesia, MiraLax, etc) should be taken according to package directions if there are no bowel movements after 48 hours.   5. Watch out for diarrhea.  If you have many loose bowel movements, simplify your diet to bland foods & liquids for a few days.  Stop any stool softeners and decrease your fiber supplement.  Switching to mild anti-diarrheal medications (Kayopectate, Pepto Bismol) can help.  If this worsens or does not improve, please call us. 6. Wash / shower every day.  You may shower over the incision / wound.  Avoid baths until the skin is fully healed.  Continue to shower over incision(s) after the dressing is off. 7. Remove your waterproof bandages 5 days after surgery.  You may leave the incision open to air.  You may replace a dressing/Band-Aid to cover the incision for comfort if you wish. 8. ACTIVITIES as tolerated:   a. You may resume regular (light) daily activities beginning the next day-such as daily self-care, walking, climbing stairs-gradually increasing activities as tolerated.  If you can walk 30 minutes without difficulty, it is safe to try more intense activity such as jogging, treadmill, bicycling, low-impact aerobics, swimming, etc. b. Save the most intensive and strenuous activity for last such as sit-ups, heavy lifting, contact sports, etc  Refrain from any heavy lifting or straining until you are off narcotics for pain control.   c. DO NOT PUSH THROUGH PAIN.  Let pain be your guide: If it hurts to do something, don't do it.  Pain is your body warning you to avoid that activity for another week until the pain goes down. d. You may drive when you are no longer taking prescription pain medication, you can comfortably wear a seatbelt, and you can safely maneuver your car and apply brakes. e. Dennis Bast may have sexual intercourse when it is comfortable.  9. FOLLOW UP in our office a. Please call [facility] at [phone] to set up an appointment to see your surgeon in the office for a  follow-up appointment approximately 1-2 weeks after your surgery. b. Make sure that you call for this appointment the day you arrive home to insure a convenient appointment time. 10. IF YOU HAVE DISABILITY OR FAMILY LEAVE FORMS, BRING THEM TO THE OFFICE FOR PROCESSING.  DO NOT GIVE THEM TO YOUR DOCTOR.   WHEN TO CALL us [phone]: 1. Poor pain control 2. Reactions / problems with new medications (rash/itching, nausea, etc)  3. Fever over 101.5 F (38.5 C) 4. Inability to urinate 5. Nausea and/or vomiting 6. Worsening swelling or bruising 7. Continued bleeding from incision. 8. Increased pain, redness, or drainage from the incision  The clinic staff is  available to answer your questions during regular business hours (8:30am-5pm).  Please don't hesitate to call and ask to speak to one of our nurses for clinical concerns.   A surgeon from [facility] is always on call at the hospitals   If you have a medical emergency, go to the nearest emergency room or call 911.    [facility], [address], [address], [place], [place]  [address] ? MAIN: [phone] ? TOLL FREE: [phone] ? FAX [phone] [email or web address]   GETTING TO GOOD BOWEL HEALTH. Irregular bowel habits such as constipation and diarrhea can lead to many problems over time.  Having one soft bowel movement a day is the most important way to prevent further problems.  The anorectal canal is designed to handle stretching and feces to safely manage our ability to get rid of solid waste (feces, poop, stool) out of our body.  BUT, hard constipated stools can act like ripping concrete bricks and diarrhea can be a burning fire to this very sensitive area of our body, causing inflamed hemorrhoids, anal fissures, increasing risk is perirectal abscesses, abdominal pain/bloating, an making irritable bowel worse.     The goal: ONE SOFT BOWEL MOVEMENT A DAY!  To have soft, regular  bowel movements:    Drink at least 8 tall glasses of water a day.     Take plenty of fiber.  Fiber is the undigested part of plant food that passes into the colon, acting s "natures broom" to encourage bowel motility and movement.  Fiber can absorb and hold large amounts of water. This results in a larger, bulkier stool, which is soft and easier to pass. Work gradually over several weeks up to 6 servings a day of fiber (25g a day even more if needed) in the form of: o Vegetables -- Root (potatoes, carrots, turnips), leafy green (lettuce, salad greens, celery, spinach), or cooked high residue (cabbage, broccoli, etc) o Fruit -- Fresh (unpeeled skin & pulp), Dried (prunes, apricots, cherries, etc ),  or stewed ( applesauce)  o Whole grain breads, pasta, etc (whole wheat)  o Bran cereals    Bulking Agents -- This type of water-retaining fiber generally is easily obtained each day by one of the following:  o Psyllium bran -- The psyllium plant is remarkable because its ground seeds can retain so much water. This product is available as Metamucil, Konsyl, Effersyllium, Per Diem Fiber, or the less expensive generic preparation in drug and health food stores. Although labeled a laxative, it really is not a laxative.  o Methylcellulose -- This is another fiber derived from wood which also retains water. It is available as Citrucel. o Polyethylene Glycol - and "artificial" fiber commonly called Miralax or Glycolax.  It is helpful for people with gassy or bloated feelings with regular fiber o Flax Seed - a less gassy fiber than psyllium   No reading or other relaxing activity while on the toilet. If bowel movements take longer than 5 minutes, you are too constipated   AVOID CONSTIPATION.  High fiber and water intake usually takes care of this.  Sometimes a laxative is needed to stimulate more frequent bowel movements, but    Laxatives are not a good long-term solution as it can wear the colon out. o Osmotics  (Milk of Magnesia, Fleets phosphosoda, Magnesium citrate, MiraLax, GoLytely) are safer than  o Stimulants (Senokot, Castor Oil, Dulcolax, Ex Lax)    o Do not take laxatives for more  than 7days in a row.    IF SEVERELY CONSTIPATED, try a Bowel Retraining Program: o Do not use laxatives.  o Eat a diet high in roughage, such as bran cereals and leafy vegetables.  o Drink six (6) ounces of prune or apricot juice each morning.  o Eat two (2) large servings of stewed fruit each day.  o Take one (1) heaping tablespoon of a psyllium-based bulking agent twice a day. Use sugar-free sweetener when possible to avoid excessive calories.  o Eat a normal breakfast.  o Set aside 15 minutes after breakfast to sit on the toilet, but do not strain to have a bowel movement.  o If you do not have a bowel movement by the third day, use an enema and repeat the above steps.    Controlling diarrhea o Switch to liquids and simpler foods for a few days to avoid stressing your intestines further. o Avoid dairy products (especially milk & ice cream) for a short time.  The intestines often can lose the ability to digest lactose when stressed. o Avoid foods that cause gassiness or bloating.  Typical foods include beans and other legumes, cabbage, broccoli, and dairy foods.  Every person has some sensitivity to other foods, so listen to our body and avoid those foods that trigger problems for you. o Adding fiber (Citrucel, Metamucil, psyllium, Miralax) gradually can help thicken stools by absorbing excess fluid and retrain the intestines to act more normally.  Slowly increase the dose over a few weeks.  Too much fiber too soon can backfire and cause cramping & bloating. o Probiotics (such as active yogurt, Align, etc) may help repopulate the intestines and colon with normal bacteria and calm down a sensitive digestive tract.  Most studies show it to be of mild help, though, and such products can be costly. o Medicines:   Bismuth  subsalicylate (ex. Kayopectate, Pepto Bismol) every 30 minutes for up to 6 doses can help control diarrhea.  Avoid if pregnant.   Loperamide (Immodium) can slow down diarrhea.  Start with two tablets (4mg  total) first and then try one tablet every 6 hours.  Avoid if you are having fevers or severe pain.  If you are not better or start feeling worse, stop all medicines and call your doctor for advice o Call your doctor if you are getting worse or not better.  Sometimes further testing (cultures, endoscopy, X-ray studies, bloodwork, etc) may be needed to help diagnose and treat the cause of the diarrhea. o

## 2013-12-18 NOTE — Progress Notes (Signed)
Subjective:     Patient ID: [name], male   DOB: 1940-10-24, 73 y.o.   MRN: [id number]  HPI  Note: Portions of this report may have been transcribed using voice recognition software. Every effort was made to ensure accuracy; however, inadvertent computerized transcription errors may be present.   Any transcriptional errors that result from this process are unintentional.            [name]  July 11, 1940 [id number]  Patient Care Team: Joni Reining, DO as PCP - General (Internal Medicine) Lafayette Dragon, MD as Consulting Physician (Gastroenterology)  This patient is a 73 y.o.male who presents today for surgical evaluation at the request of Dr. Olevia Perches.   Reason for visit: Recurrent polyp at hepatic flexure of colon  Pleasant active male.  History of polyp at hepatic flexure.  Endoscopic resection attempted 2003.  Small perforation.  Treated with antibiotics.  Repeat colonoscopy noted recurrence 2010.  Resected.  Repeat endoscopic evaluation notes recurrence again last month.  Seems more bulky.  Biopsy consistent with tubulovillous adenoma.  Felt not to be resected endoscopically.  A surgical consultation requested.  Patient has bowel movement every other day.  Can walk 30 minutes slowly without difficulty.  No history of coronary or pulmonary issues.  No history of stroke.  Has not smoked tobacco for 15 years.  He has never had any abdominal or hernia surgery.  Takes a baby aspirin for general cardiac protection.  No personal nor family history of GI/colon cancer, inflammatory bowel disease, irritable bowel syndrome, allergy such as Celiac Sprue, dietary/dairy problems, colitis, ulcers nor gastritis.  No recent sick contacts/gastroenteritis.  No travel outside the country.  No changes in diet.  No dysphagia to solids or liquids.  No significant heartburn or reflux.  No hematochezia, hematemesis, coffee ground emesis.  No evidence of prior gastric/peptic ulceration.    Patient Active  Problem List   Diagnosis Date Noted  . Preventative health care 07/27/2013  . Creatinine elevation 02/02/2013  . Primary insomnia 11/20/2009  . COPD, moderate 11/20/2009  . CHEST XRAY, ABNORMAL 11/20/2009  . ADVERSE DRUG REACTION 06/24/2009  . PERFORATION, INTESTINE 06/19/2006  . HYPERLIPIDEMIA 04/09/2006  . History of alcohol abuse 04/09/2006  . History of tobacco abuse 04/09/2006  . ASBESTOSIS 04/09/2006  . GASTROESOPHAGEAL REFLUX DISEASE 04/09/2006  . 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 Hyperlipidemia   . 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 GERD (gastroesophageal reflux disease)   . Colonic polyp     Tubulovillous adenomatous  . Tobacco abuse     quit 2001  . Alcohol abuse   . Gout 12/2007    inadequate aspiration of MTP joint left great toe  06/18/08; -fluid:  red, bloody, cell count not performed (TNP) and no crystals or organisms seen.  [name] COPD (chronic obstructive pulmonary disease)     Past Surgical History  Procedure Laterality Date  . Wrist surgery Left   . Colonoscopy    . Dupuytren contracture release    . Cataract extraction      History   Social History  . Marital Status: Widowed    Spouse Name: N/A    Number of Children: 1  . Years of Education: N/A   Occupational History  . Retired    Social History Main Topics  . Smoking status: Former Smoker    Types: Cigarettes    Quit date: 08/27/2000  .  Smokeless tobacco: Never Used  . Alcohol Use: No  . Drug Use: No  . Sexual Activity: Not on file   Other Topics Concern  . Not on file   Social History Narrative  . No narrative on file    Family History  Problem Relation Age of Onset  . Colon cancer Neg Hx   . Pancreatic cancer Neg Hx   . Stomach cancer Neg Hx   . Rectal cancer Neg Hx     Current Outpatient Prescriptions  Medication Sig Dispense Refill  . amitriptyline (ELAVIL) 25 MG tablet Take 1 tablet (25 mg total)  by mouth at bedtime.  30 tablet  5  . aspirin 81 MG tablet Take 81 mg by mouth daily.      [name] atorvastatin (LIPITOR) 40 MG tablet TAKE ONE TABLET BY MOUTH ONCE DAILY  30 tablet  11  . budesonide-formoterol (SYMBICORT) 160-4.5 MCG/ACT inhaler Inhale 2 puffs into the lungs 2 (two) times daily.  1 Inhaler  12  . chlorhexidine (PERIDEX) 0.12 % solution Use as directed 15 mLs in the mouth or throat 4 (four) times daily as needed.  240 mL  1  . naproxen sodium (ANAPROX) 220 MG tablet Take 220 mg by mouth 2 (two) times daily with a meal. For shoulder pain      . Omega-3 Fatty Acids (FISH OIL) 1200 MG CAPS Take 2 capsules by mouth daily.      . pantoprazole (PROTONIX) 40 MG tablet TAKE ONE TABLET BY MOUTH ONCE DAILY  30 tablet  2  . albuterol (PROVENTIL HFA;VENTOLIN HFA) 108 (90 BASE) MCG/ACT inhaler Inhale 2 puffs into the lungs every 6 (six) hours as needed for wheezing.  1 Inhaler  1   No current facility-administered medications for this visit.     No Known Allergies  BP 126/72  Pulse 80  Temp(Src) 97.8 F (36.6 C)  Ht 5\' 6"  (1.676 m)  Wt 139 lb (63.05 kg)  BMI 22.45 kg/m2  No results found.   Review of Systems  Constitutional: Negative for fever, chills and diaphoresis.  HENT: Negative for ear discharge, facial swelling, mouth sores, nosebleeds, sore throat and trouble swallowing.   Eyes: Negative for photophobia, discharge and visual disturbance.  Respiratory: Negative for choking, chest tightness, shortness of breath and stridor.   Cardiovascular: Negative for chest pain and palpitations.  Gastrointestinal: Negative for nausea, vomiting, abdominal pain, diarrhea, constipation, blood in stool, abdominal distention, anal bleeding and rectal pain.  Endocrine: Negative for cold intolerance and heat intolerance.  Genitourinary: Negative for dysuria, urgency, difficulty urinating and testicular pain.  Musculoskeletal: Negative for arthralgias, back pain, gait problem and myalgias.  Skin:  Negative for color change, pallor, rash and wound.  Allergic/Immunologic: Negative for environmental allergies and food allergies.  Neurological: Negative for dizziness, speech difficulty, weakness, numbness and headaches.  Hematological: Negative for adenopathy. Does not bruise/bleed easily.  Psychiatric/Behavioral: Negative for hallucinations, confusion and agitation.       Objective:   Physical Exam  Constitutional: He is oriented to person, place, and time. He appears well-developed and well-nourished. No distress.  HENT:  Head: Normocephalic.  Mouth/Throat: Oropharynx is clear and moist. No oropharyngeal exudate.  Eyes: Conjunctivae and EOM are normal. Pupils are equal, round, and reactive to light. No scleral icterus.  Neck: Normal range of motion. Neck supple. No tracheal deviation present.  Cardiovascular: Normal rate, regular rhythm and intact distal pulses.   Pulmonary/Chest: Effort normal and breath sounds normal. No respiratory distress.  Abdominal:  Soft. He exhibits no distension. There is no tenderness. No hernia. Hernia confirmed negative in the ventral area, confirmed negative in the right inguinal area and confirmed negative in the left inguinal area.    Musculoskeletal: Normal range of motion. He exhibits no tenderness.  Lymphadenopathy:    He has no cervical adenopathy.       Right: No inguinal adenopathy present.       Left: No inguinal adenopathy present.  Neurological: He is alert and oriented to person, place, and time. No cranial nerve deficit. He exhibits normal muscle tone. Coordination normal.  Skin: Skin is warm and dry. No rash noted. He is not diaphoretic. No erythema. No pallor.  Psychiatric: He has a normal mood and affect. His behavior is normal. Judgment and thought content normal.       Assessment:     Recurrent had adenomatous polyp at hepatic flexure refractory to prior endoscopic resections     Plan:     That he would benefit from segmental  colonic resection.  Probable proximal right colectomy.  Regional minimally invasive approach.  Will try robotically with intracorporeal in-line anastomosis to decrease incisional pain and postoperative ileus:  The anatomy & physiology of the digestive tract was discussed.  The pathophysiology of the colon was discussed.  Natural history risks without surgery was discussed.   I feel the risks of no intervention will lead to serious problems that outweigh the operative risks; therefore, I recommended a partial colectomy to remove the pathology.  Minimally invasive (Robotic/Laparoscopic) & open techniques were discussed.   Risks such as bleeding, infection, abscess, leak, reoperation, possible ostomy, hernia, heart attack, death, and other risks were discussed.  I noted a good likelihood this will help address the problem.   Goals of post-operative recovery were discussed as well.   Need for bowel regimen and healthy physical activity to optimize recovery noted as well. We will work to minimize complications.  Educational materials were given as well.  Questions were answered.  The patient expresses understanding & wishes to proceed with surgery.

## 2014-01-05 ENCOUNTER — Encounter (HOSPITAL_COMMUNITY): Payer: Self-pay | Admitting: Pharmacy Technician

## 2014-01-10 ENCOUNTER — Other Ambulatory Visit (HOSPITAL_COMMUNITY): Payer: Self-pay | Admitting: Surgery

## 2014-01-10 NOTE — Patient Instructions (Addendum)
Your procedure is scheduled on:  01/18/14  THURSDAY  Report to [facility] at  [facility]     AM.   Call this number if you have problems the morning of surgery: [phone]     BOWEL PREP AS PER OFFICE   Do not TAKE ANYTHING BY MOUTH :After Midnight. Wednesday NIGHT   Take these medicines the morning of surgery with A SIP OF WATER:NO REGULAR MEDICATIONS--- USE SYMBICORT  REMEMBER-- STOP ASPIRIN, ANTIINFLAMMATORIES AND HERBAL MEDICATIONS 5 DAYS BEFORE SURGERY .  Contacts, dentures or partial plates, or metal hairpins  can not be worn to surgery. Your family will be responsible for glasses, dentures, hearing aides while you are in surgery  Leave suitcase in the car. After surgery it may be brought to your room.  For patients admitted to the hospital, checkout time is 11:00 AM day of  discharge.         [facility] IS NOT RESPONSIBLE FOR ANY VALUABLES                                                                  [facility] - Preparing for Surgery Before surgery, you can play an important role.  Because skin is not sterile, your skin needs to be as free of germs as possible.  You can reduce the number of germs on your skin by washing with CHG (chlorahexidine gluconate) soap before surgery.  CHG is an antiseptic cleaner which kills germs and bonds with the skin to continue killing germs even after washing. Please DO NOT use if you have an allergy to CHG or antibacterial soaps.  If your skin becomes reddened/irritated stop using the CHG and inform your nurse when you arrive at Short Stay. Do not shave (including legs and underarms) for at least 48 hours prior to the first CHG shower.  You may shave your face/neck. Please follow these instructions carefully:  1.  Shower with CHG Soap the night before surgery and the  morning of Surgery.  2.  If you choose to wash your hair, wash your hair first as usual with your   normal  shampoo.  3.  After you shampoo, rinse your hair and body thoroughly to remove the  shampoo.                           4.  Use CHG as you would any other liquid soap.  You can apply chg directly  to the skin and wash                       Gently with a scrungie or clean washcloth.  5.  Apply the CHG Soap to your body ONLY FROM THE NECK DOWN.   Do not use on face/ open                           Wound or open sores. Avoid contact with eyes, ears mouth and genitals (private parts).  Wash face,  Genitals (private parts) with your normal soap.             6.  Wash thoroughly, paying special attention to the area where your surgery  will be performed.  7.  Thoroughly rinse your body with warm water from the neck down.  8.  DO NOT shower/wash with your normal soap after using and rinsing off  the CHG Soap.                9.  Pat yourself dry with a clean towel.            10.  Wear clean pajamas.            11.  Place clean sheets on your bed the night of your first shower and do not  sleep with pets. Day of Surgery : Do not apply any lotions/deodorants the morning of surgery.  Please wear clean clothes to the hospital/surgery center.  FAILURE TO FOLLOW THESE INSTRUCTIONS MAY RESULT IN THE CANCELLATION OF YOUR SURGERY PATIENT SIGNATURE_________________________________  NURSE SIGNATURE__________________________________  ________________________________________________________________________  WHAT IS A BLOOD TRANSFUSION? Blood Transfusion Information  A transfusion is the replacement of blood or some of its parts. Blood is made up of multiple cells which provide different functions.  Red blood cells carry oxygen and are used for blood loss replacement.  White blood cells fight against infection.  Platelets control bleeding.  Plasma helps clot blood.  Other blood products are available for specialized needs, such as hemophilia or other clotting disorders. BEFORE  THE TRANSFUSION  Who gives blood for transfusions?   Healthy volunteers who are fully evaluated to make sure their blood is safe. This is blood bank blood. Transfusion therapy is the safest it has ever been in the practice of medicine. Before blood is taken from a donor, a complete history is taken to make sure that person has no history of diseases nor engages in risky social behavior (examples are intravenous drug use or sexual activity with multiple partners). The donor's travel history is screened to minimize risk of transmitting infections, such as malaria. The donated blood is tested for signs of infectious diseases, such as HIV and hepatitis. The blood is then tested to be sure it is compatible with you in order to minimize the chance of a transfusion reaction. If you or a relative donates blood, this is often done in anticipation of surgery and is not appropriate for emergency situations. It takes many days to process the donated blood. RISKS AND COMPLICATIONS Although transfusion therapy is very safe and saves many lives, the main dangers of transfusion include:   Getting an infectious disease.  Developing a transfusion reaction. This is an allergic reaction to something in the blood you were given. Every precaution is taken to prevent this. The decision to have a blood transfusion has been considered carefully by your caregiver before blood is given. Blood is not given unless the benefits outweigh the risks. AFTER THE TRANSFUSION  Right after receiving a blood transfusion, you will usually feel much better and more energetic. This is especially true if your red blood cells have gotten low (anemic). The transfusion raises the level of the red blood cells which carry oxygen, and this usually causes an energy increase.  The nurse administering the transfusion will monitor you carefully for complications. HOME CARE INSTRUCTIONS  No special instructions are needed after a transfusion. You may  find your energy is better. Speak with your caregiver about any  limitations on activity for underlying diseases you may have. SEEK MEDICAL CARE IF:   Your condition is not improving after your transfusion.  You develop redness or irritation at the intravenous (IV) site. SEEK IMMEDIATE MEDICAL CARE IF:  Any of the following symptoms occur over the next 12 hours:  Shaking chills.  You have a temperature by mouth above 102 F (38.9 C), not controlled by medicine.  Chest, back, or muscle pain.  People around you feel you are not acting correctly or are confused.  Shortness of breath or difficulty breathing.  Dizziness and fainting.  You get a rash or develop hives.  You have a decrease in urine output.  Your urine turns a dark color or changes to pink, red, or brown. Any of the following symptoms occur over the next 10 days:  You have a temperature by mouth above 102 F (38.9 C), not controlled by medicine.  Shortness of breath.  Weakness after normal activity.  The white part of the eye turns yellow (jaundice).  You have a decrease in the amount of urine or are urinating less often.  Your urine turns a dark color or changes to pink, red, or brown. Document Released: 05/22/2000 Document Revised: 08/17/2011 Document Reviewed: 01/09/2008 ExitCare Patient Information 2014 Exit[employer], [place].  _______________________________________________________________________   CLEAR LIQUID DIET   Foods Allowed                                                                     Foods Excluded  Coffee and tea, regular and decaf                             liquids that you cannot  Plain Jell-O in any flavor                                             see through such as: Fruit ices (not with fruit pulp)                                     milk, soups, orange juice  Iced Popsicles                                    All solid food Carbonated beverages, regular and diet                                     Cranberry, grape and apple juices Sports drinks like Gatorade Lightly seasoned clear broth or consume(fat free) Sugar, honey syrup  Sample Menu Breakfast                                Lunch  Supper Cranberry juice                    Beef broth                            Chicken broth Jell-O                                     Grape juice                           Apple juice Coffee or tea                        Jell-O                                      Popsicle                                                Coffee or tea                        Coffee or tea  _____________________________________________________________________

## 2014-01-11 ENCOUNTER — Encounter (HOSPITAL_COMMUNITY): Payer: Self-pay

## 2014-01-11 ENCOUNTER — Encounter (HOSPITAL_COMMUNITY)
Admission: RE | Admit: 2014-01-11 | Discharge: 2014-01-11 | Disposition: A | Discharge status: Home / Self Care | Payer: Medicare Other | Source: Ambulatory Visit | Attending: Surgery | Admitting: Surgery

## 2014-01-11 ENCOUNTER — Ambulatory Visit (HOSPITAL_COMMUNITY)
Admission: RE | Admit: 2014-01-11 | Discharge: 2014-01-11 | Disposition: A | Discharge status: Home / Self Care | Payer: Medicare Other | Source: Ambulatory Visit | Attending: Anesthesiology | Admitting: Anesthesiology

## 2014-01-11 DIAGNOSIS — J4489 Other specified chronic obstructive pulmonary disease: Secondary | ICD-10-CM | POA: Insufficient documentation

## 2014-01-11 DIAGNOSIS — Z01818 Encounter for other preprocedural examination: Secondary | ICD-10-CM | POA: Insufficient documentation

## 2014-01-11 DIAGNOSIS — J449 Chronic obstructive pulmonary disease, unspecified: Secondary | ICD-10-CM | POA: Diagnosis not present

## 2014-01-11 DIAGNOSIS — Z01812 Encounter for preprocedural laboratory examination: Secondary | ICD-10-CM | POA: Insufficient documentation

## 2014-01-11 DIAGNOSIS — F17200 Nicotine dependence, unspecified, uncomplicated: Secondary | ICD-10-CM | POA: Insufficient documentation

## 2014-01-11 DIAGNOSIS — R091 Pleurisy: Secondary | ICD-10-CM | POA: Diagnosis not present

## 2014-01-11 HISTORY — DX: Insomnia, unspecified: G47.00

## 2014-01-11 LAB — BASIC METABOLIC PANEL
Anion gap: 9 (ref 5–15)
BUN: 16 mg/dL (ref 6–23)
CO2: 27 mEq/L (ref 19–32)
Calcium: 9.6 mg/dL (ref 8.4–10.5)
Chloride: 104 mEq/L (ref 96–112)
Creatinine, Ser: 1.05 mg/dL (ref 0.50–1.35)
GFR, EST AFRICAN AMERICAN: 80 mL/min — AB (ref >90)
GFR, EST NON AFRICAN AMERICAN: 69 mL/min — AB (ref >90)
Glucose, Bld: 103 mg/dL — ABNORMAL HIGH (ref 70–99)
Potassium: 4.3 mEq/L (ref 3.7–5.3)
SODIUM: 140 meq/L (ref 137–147)

## 2014-01-11 LAB — CBC
HCT: 48.4 % (ref 39.0–52.0)
Hemoglobin: 16.5 g/dL (ref 13.0–17.0)
MCH: 31.4 pg (ref 26.0–34.0)
MCHC: 34.1 g/dL (ref 30.0–36.0)
MCV: 92 fL (ref 78.0–100.0)
PLATELETS: 168 10*3/uL (ref 150–400)
RBC: 5.26 MIL/uL (ref 4.22–5.81)
RDW: 13 % (ref 11.5–15.5)
WBC: 8.4 10*3/uL (ref 4.0–10.5)

## 2014-01-11 LAB — HEMOGLOBIN A1C
HEMOGLOBIN A1C: 5.8 % — AB (ref <5.7)
Mean Plasma Glucose: 120 mg/dL — ABNORMAL HIGH (ref <117)

## 2014-01-11 LAB — ABO/RH: ABO/RH(D): A POS

## 2014-01-12 ENCOUNTER — Other Ambulatory Visit: Payer: Self-pay | Admitting: *Deleted

## 2014-01-12 MED ORDER — BUDESONIDE-FORMOTEROL FUMARATE 160-4.5 MCG/ACT IN AERO: 2.0000 | INHALATION_SPRAY | Freq: Two times a day (BID) | RESPIRATORY_TRACT | Status: DC

## 2014-01-12 NOTE — Progress Notes (Signed)
Faxed hgb a1c to dr gross via epic

## 2014-01-17 DIAGNOSIS — R410 Disorientation, unspecified: Secondary | ICD-10-CM | POA: Insufficient documentation

## 2014-01-17 MED ORDER — GENTAMICIN SULFATE 40 MG/ML IJ SOLN
INTRAMUSCULAR | Status: DC
  Filled 2014-01-17: qty 6

## 2014-01-17 NOTE — Anesthesia Preprocedure Evaluation (Addendum)
Anesthesia Evaluation  Patient identified by MRN, date of birth, ID band Patient awake    Reviewed: Allergy & Precautions, H&P , NPO status , Patient's Chart, lab work & pertinent test results  Airway Mallampati: II TM Distance: >3 FB Neck ROM: Full    Dental no notable dental hx.    Pulmonary COPDformer smoker,  breath sounds clear to auscultation  Pulmonary exam normal       Cardiovascular negative cardio ROS  Rhythm:Regular Rate:Normal     Neuro/Psych negative neurological ROS  negative psych ROS   GI/Hepatic Neg liver ROS, GERD-  ,  Endo/Other  negative endocrine ROS  Renal/GU negative Renal ROS     Musculoskeletal negative musculoskeletal ROS (+)   Abdominal   Peds  Hematology negative hematology ROS (+)   Anesthesia Other Findings   Reproductive/Obstetrics                          Anesthesia Physical Anesthesia Plan  ASA: II  Anesthesia Plan: General   Post-op Pain Management:    Induction: Intravenous  Airway Management Planned: Oral ETT  Additional Equipment:   Intra-op Plan:   Post-operative Plan: Extubation in OR  Informed Consent: I have reviewed the patients History and Physical, chart, labs and discussed the procedure including the risks, benefits and alternatives for the proposed anesthesia with the patient or authorized representative who has indicated his/her understanding and acceptance.   Dental advisory given  Plan Discussed with: CRNA  Anesthesia Plan Comments:         Anesthesia Quick Evaluation

## 2014-01-18 ENCOUNTER — Encounter (HOSPITAL_COMMUNITY): Payer: Self-pay | Admitting: *Deleted

## 2014-01-18 ENCOUNTER — Inpatient Hospital Stay (HOSPITAL_COMMUNITY): Payer: Medicare Other | Admitting: Anesthesiology

## 2014-01-18 ENCOUNTER — Encounter (HOSPITAL_COMMUNITY): Payer: Medicare Other | Admitting: Anesthesiology

## 2014-01-18 ENCOUNTER — Inpatient Hospital Stay (HOSPITAL_COMMUNITY)
Admission: RE | Admit: 2014-01-18 | Discharge: 2014-01-21 | DRG: 330 | Disposition: A | Discharge status: Home / Self Care | Payer: Medicare Other | Source: Ambulatory Visit | Attending: Surgery | Admitting: Surgery

## 2014-01-18 ENCOUNTER — Encounter (HOSPITAL_COMMUNITY)
Admission: RE | Disposition: A | Discharge status: Home / Self Care | Payer: Self-pay | Source: Ambulatory Visit | Attending: Surgery

## 2014-01-18 DIAGNOSIS — D375 Neoplasm of uncertain behavior of rectum: Secondary | ICD-10-CM

## 2014-01-18 DIAGNOSIS — D371 Neoplasm of uncertain behavior of stomach: Secondary | ICD-10-CM | POA: Diagnosis not present

## 2014-01-18 DIAGNOSIS — D126 Benign neoplasm of colon, unspecified: Secondary | ICD-10-CM | POA: Diagnosis present

## 2014-01-18 DIAGNOSIS — E785 Hyperlipidemia, unspecified: Secondary | ICD-10-CM | POA: Diagnosis present

## 2014-01-18 DIAGNOSIS — K219 Gastro-esophageal reflux disease without esophagitis: Secondary | ICD-10-CM | POA: Diagnosis present

## 2014-01-18 DIAGNOSIS — Z87891 Personal history of nicotine dependence: Secondary | ICD-10-CM | POA: Diagnosis not present

## 2014-01-18 DIAGNOSIS — F05 Delirium due to known physiological condition: Secondary | ICD-10-CM | POA: Diagnosis not present

## 2014-01-18 DIAGNOSIS — J449 Chronic obstructive pulmonary disease, unspecified: Secondary | ICD-10-CM | POA: Diagnosis not present

## 2014-01-18 DIAGNOSIS — F1011 Alcohol abuse, in remission: Secondary | ICD-10-CM | POA: Diagnosis present

## 2014-01-18 DIAGNOSIS — Z860100 Personal history of colon polyps, unspecified: Secondary | ICD-10-CM | POA: Diagnosis not present

## 2014-01-18 DIAGNOSIS — D360 Benign neoplasm of lymph nodes: Secondary | ICD-10-CM | POA: Diagnosis not present

## 2014-01-18 DIAGNOSIS — D378 Neoplasm of uncertain behavior of other specified digestive organs: Secondary | ICD-10-CM

## 2014-01-18 DIAGNOSIS — J4489 Other specified chronic obstructive pulmonary disease: Secondary | ICD-10-CM | POA: Diagnosis not present

## 2014-01-18 DIAGNOSIS — Z9849 Cataract extraction status, unspecified eye: Secondary | ICD-10-CM | POA: Diagnosis not present

## 2014-01-18 HISTORY — DX: Perforation of intestine (nontraumatic): K63.1

## 2014-01-18 HISTORY — PX: LAPAROSCOPIC PARTIAL COLECTOMY: SHX5907

## 2014-01-18 LAB — TYPE AND SCREEN
ABO/RH(D): A POS
Antibody Screen: NEGATIVE

## 2014-01-18 SURGERY — LAPAROSCOPIC PARTIAL COLECTOMY
Anesthesia: General | Laterality: Right

## 2014-01-18 MED ORDER — CEFOTETAN DISODIUM 2 G IJ SOLR
2.0000 g | Freq: Two times a day (BID) | INTRAMUSCULAR | Status: AC
  Administered 2014-01-18: 2 g via INTRAVENOUS
  Filled 2014-01-18: qty 2

## 2014-01-18 MED ORDER — NEOSTIGMINE METHYLSULFATE 10 MG/10ML IV SOLN
INTRAVENOUS | Status: AC
  Filled 2014-01-18: qty 1

## 2014-01-18 MED ORDER — PHENOL 1.4 % MT LIQD: 2.0000 | OROMUCOSAL | Status: DC | PRN

## 2014-01-18 MED ORDER — DIPHENHYDRAMINE HCL 12.5 MG/5ML PO ELIX: 12.5000 mg | ORAL_SOLUTION | Freq: Four times a day (QID) | ORAL | Status: DC | PRN

## 2014-01-18 MED ORDER — BUPIVACAINE-EPINEPHRINE 0.25% -1:200000 IJ SOLN
INTRAMUSCULAR | Status: AC
  Filled 2014-01-18: qty 1

## 2014-01-18 MED ORDER — HYDROMORPHONE HCL PF 1 MG/ML IJ SOLN
INTRAMUSCULAR | Status: AC
  Filled 2014-01-18: qty 1

## 2014-01-18 MED ORDER — MENTHOL 3 MG MT LOZG
1.0000 | LOZENGE | OROMUCOSAL | Status: DC | PRN
  Filled 2014-01-18: qty 9

## 2014-01-18 MED ORDER — EPHEDRINE SULFATE 50 MG/ML IJ SOLN
INTRAMUSCULAR | Status: AC
  Filled 2014-01-18: qty 1

## 2014-01-18 MED ORDER — ACETAMINOPHEN 500 MG PO TABS
1000.0000 mg | ORAL_TABLET | Freq: Three times a day (TID) | ORAL | Status: DC
  Administered 2014-01-18 – 2014-01-21 (×9): 1000 mg via ORAL
  Filled 2014-01-18 (×11): qty 2

## 2014-01-18 MED ORDER — KCL IN DEXTROSE-NACL 40-5-0.9 MEQ/L-%-% IV SOLN
INTRAVENOUS | Status: DC
  Administered 2014-01-18 – 2014-01-20 (×3): via INTRAVENOUS
  Filled 2014-01-18 (×6): qty 1000

## 2014-01-18 MED ORDER — ALVIMOPAN 12 MG PO CAPS
12.0000 mg | ORAL_CAPSULE | Freq: Two times a day (BID) | ORAL | Status: DC
  Administered 2014-01-19 (×2): 12 mg via ORAL
  Filled 2014-01-18 (×4): qty 1

## 2014-01-18 MED ORDER — BUPIVACAINE-EPINEPHRINE 0.25% -1:200000 IJ SOLN
INTRAMUSCULAR | Status: DC | PRN
  Administered 2014-01-18: 60 mL

## 2014-01-18 MED ORDER — PROMETHAZINE HCL 25 MG/ML IJ SOLN: 6.2500 mg | Freq: Four times a day (QID) | INTRAMUSCULAR | Status: DC | PRN

## 2014-01-18 MED ORDER — DEXTROSE 5 % IV SOLN
2.0000 g | INTRAVENOUS | Status: AC
  Administered 2014-01-18: 2 g via INTRAVENOUS
  Filled 2014-01-18: qty 2

## 2014-01-18 MED ORDER — SACCHAROMYCES BOULARDII 250 MG PO CAPS
250.0000 mg | ORAL_CAPSULE | Freq: Two times a day (BID) | ORAL | Status: DC
  Administered 2014-01-18 – 2014-01-21 (×6): 250 mg via ORAL
  Filled 2014-01-18 (×7): qty 1

## 2014-01-18 MED ORDER — GLYCOPYRROLATE 0.2 MG/ML IJ SOLN
INTRAMUSCULAR | Status: AC
  Filled 2014-01-18: qty 2

## 2014-01-18 MED ORDER — POLYETHYL GLYCOL-PROPYL GLYCOL 0.4-0.3 % OP SOLN: 1.0000 [drp] | Freq: Two times a day (BID) | OPHTHALMIC | Status: DC

## 2014-01-18 MED ORDER — POLYVINYL ALCOHOL 1.4 % OP SOLN
1.0000 [drp] | Freq: Two times a day (BID) | OPHTHALMIC | Status: DC
  Administered 2014-01-18 – 2014-01-21 (×6): 1 [drp] via OPHTHALMIC
  Filled 2014-01-18 (×2): qty 15

## 2014-01-18 MED ORDER — MEPERIDINE HCL 50 MG/ML IJ SOLN
INTRAMUSCULAR | Status: AC
  Filled 2014-01-18: qty 1

## 2014-01-18 MED ORDER — ROCURONIUM BROMIDE 100 MG/10ML IV SOLN
INTRAVENOUS | Status: AC
  Filled 2014-01-18: qty 1

## 2014-01-18 MED ORDER — FENTANYL CITRATE 0.05 MG/ML IJ SOLN
INTRAMUSCULAR | Status: AC
  Filled 2014-01-18: qty 5

## 2014-01-18 MED ORDER — LIDOCAINE HCL (CARDIAC) 20 MG/ML IV SOLN
INTRAVENOUS | Status: AC
  Filled 2014-01-18: qty 5

## 2014-01-18 MED ORDER — ATROPINE SULFATE 0.4 MG/ML IJ SOLN
INTRAMUSCULAR | Status: AC
  Filled 2014-01-18: qty 2

## 2014-01-18 MED ORDER — LACTATED RINGERS IV SOLN
INTRAVENOUS | Status: DC | PRN
  Administered 2014-01-18: 07:00:00 via INTRAVENOUS

## 2014-01-18 MED ORDER — BUPIVACAINE 0.25 % ON-Q PUMP DUAL CATH 300 ML
300.0000 mL | INJECTION | Status: DC
  Filled 2014-01-18: qty 300

## 2014-01-18 MED ORDER — PROPOFOL 10 MG/ML IV BOLUS
INTRAVENOUS | Status: DC | PRN
  Administered 2014-01-18: 130 mg via INTRAVENOUS

## 2014-01-18 MED ORDER — MEPERIDINE HCL 50 MG/ML IJ SOLN
6.2500 mg | INTRAMUSCULAR | Status: DC | PRN
  Administered 2014-01-18: 6.25 mg via INTRAVENOUS

## 2014-01-18 MED ORDER — OXYCODONE HCL 5 MG PO TABS: 5.0000 mg | ORAL_TABLET | Freq: Once | ORAL | Status: DC | PRN

## 2014-01-18 MED ORDER — LACTATED RINGERS IV BOLUS (SEPSIS)
1000.0000 mL | Freq: Three times a day (TID) | INTRAVENOUS | Status: AC | PRN
  Administered 2014-01-18: 1000 mL via INTRAVENOUS

## 2014-01-18 MED ORDER — ATORVASTATIN CALCIUM 40 MG PO TABS
40.0000 mg | ORAL_TABLET | Freq: Every day | ORAL | Status: DC
  Administered 2014-01-18 – 2014-01-20 (×3): 40 mg via ORAL
  Filled 2014-01-18 (×4): qty 1

## 2014-01-18 MED ORDER — NEOSTIGMINE METHYLSULFATE 10 MG/10ML IV SOLN
INTRAVENOUS | Status: DC | PRN
Start: 2014-01-18 — End: 2014-01-18
  Administered 2014-01-18: 3 mg via INTRAVENOUS

## 2014-01-18 MED ORDER — ONDANSETRON HCL 4 MG/2ML IJ SOLN
INTRAMUSCULAR | Status: DC | PRN
  Administered 2014-01-18: 4 mg via INTRAVENOUS

## 2014-01-18 MED ORDER — HEPARIN SODIUM (PORCINE) 5000 UNIT/ML IJ SOLN
5000.0000 [IU] | Freq: Once | INTRAMUSCULAR | Status: AC
  Administered 2014-01-18: 5000 [IU] via SUBCUTANEOUS
  Filled 2014-01-18: qty 1

## 2014-01-18 MED ORDER — LIP MEDEX EX OINT
1.0000 | TOPICAL_OINTMENT | Freq: Two times a day (BID) | CUTANEOUS | Status: DC
Start: 2014-01-18 — End: 2014-01-21
  Administered 2014-01-18 – 2014-01-21 (×6): 1 via TOPICAL
  Filled 2014-01-18: qty 7

## 2014-01-18 MED ORDER — AMITRIPTYLINE HCL 25 MG PO TABS
25.0000 mg | ORAL_TABLET | Freq: Every day | ORAL | Status: DC
  Administered 2014-01-18 – 2014-01-20 (×3): 25 mg via ORAL
  Filled 2014-01-18 (×4): qty 1

## 2014-01-18 MED ORDER — BUDESONIDE-FORMOTEROL FUMARATE 160-4.5 MCG/ACT IN AERO
2.0000 | INHALATION_SPRAY | Freq: Two times a day (BID) | RESPIRATORY_TRACT | Status: DC
  Administered 2014-01-18 – 2014-01-21 (×5): 2 via RESPIRATORY_TRACT
  Filled 2014-01-18: qty 6

## 2014-01-18 MED ORDER — MAGIC MOUTHWASH
15.0000 mL | Freq: Four times a day (QID) | ORAL | Status: DC | PRN
  Filled 2014-01-18: qty 15

## 2014-01-18 MED ORDER — FENTANYL CITRATE 0.05 MG/ML IJ SOLN
INTRAMUSCULAR | Status: DC | PRN
  Administered 2014-01-18 (×8): 50 ug via INTRAVENOUS
  Administered 2014-01-18: 25 ug via INTRAVENOUS

## 2014-01-18 MED ORDER — LIDOCAINE HCL (CARDIAC) 20 MG/ML IV SOLN
INTRAVENOUS | Status: DC | PRN
  Administered 2014-01-18: 60 mg via INTRAVENOUS

## 2014-01-18 MED ORDER — HYDROMORPHONE HCL PF 1 MG/ML IJ SOLN
0.5000 mg | INTRAMUSCULAR | Status: DC | PRN
  Administered 2014-01-18: 0.5 mg via INTRAVENOUS
  Administered 2014-01-18 – 2014-01-19 (×2): 1 mg via INTRAVENOUS
  Filled 2014-01-18 (×2): qty 1
  Filled 2014-01-18: qty 2

## 2014-01-18 MED ORDER — ONDANSETRON HCL 4 MG/2ML IJ SOLN
INTRAMUSCULAR | Status: AC
  Filled 2014-01-18: qty 2

## 2014-01-18 MED ORDER — DIPHENHYDRAMINE HCL 50 MG/ML IJ SOLN: 12.5000 mg | Freq: Four times a day (QID) | INTRAMUSCULAR | Status: DC | PRN

## 2014-01-18 MED ORDER — ONDANSETRON HCL 4 MG PO TABS: 4.0000 mg | ORAL_TABLET | Freq: Four times a day (QID) | ORAL | Status: DC | PRN

## 2014-01-18 MED ORDER — OXYCODONE HCL 5 MG PO TABS: 5.0000 mg | ORAL_TABLET | ORAL | Status: DC | PRN

## 2014-01-18 MED ORDER — SODIUM CHLORIDE 0.9 % IJ SOLN
INTRAMUSCULAR | Status: AC
  Filled 2014-01-18: qty 10

## 2014-01-18 MED ORDER — GLYCOPYRROLATE 0.2 MG/ML IJ SOLN
INTRAMUSCULAR | Status: DC | PRN
  Administered 2014-01-18: .4 mg via INTRAVENOUS

## 2014-01-18 MED ORDER — HEPARIN SODIUM (PORCINE) 5000 UNIT/ML IJ SOLN
5000.0000 [IU] | Freq: Three times a day (TID) | INTRAMUSCULAR | Status: DC
  Administered 2014-01-18 – 2014-01-21 (×8): 5000 [IU] via SUBCUTANEOUS
  Filled 2014-01-18 (×12): qty 1

## 2014-01-18 MED ORDER — PROPOFOL 10 MG/ML IV BOLUS
INTRAVENOUS | Status: AC
  Filled 2014-01-18: qty 20

## 2014-01-18 MED ORDER — PROMETHAZINE HCL 25 MG/ML IJ SOLN: 6.2500 mg | INTRAMUSCULAR | Status: DC | PRN

## 2014-01-18 MED ORDER — DEXAMETHASONE SODIUM PHOSPHATE 10 MG/ML IJ SOLN
INTRAMUSCULAR | Status: DC | PRN
  Administered 2014-01-18: 10 mg via INTRAVENOUS

## 2014-01-18 MED ORDER — METOPROLOL TARTRATE 1 MG/ML IV SOLN
5.0000 mg | Freq: Four times a day (QID) | INTRAVENOUS | Status: DC | PRN
  Filled 2014-01-18: qty 5

## 2014-01-18 MED ORDER — ROCURONIUM BROMIDE 100 MG/10ML IV SOLN
INTRAVENOUS | Status: DC | PRN
  Administered 2014-01-18 (×3): 10 mg via INTRAVENOUS
  Administered 2014-01-18: 30 mg via INTRAVENOUS

## 2014-01-18 MED ORDER — ASPIRIN EC 81 MG PO TBEC
81.0000 mg | DELAYED_RELEASE_TABLET | Freq: Every evening | ORAL | Status: DC
  Administered 2014-01-18 – 2014-01-20 (×2): 81 mg via ORAL
  Filled 2014-01-18 (×4): qty 1

## 2014-01-18 MED ORDER — OXYCODONE HCL 5 MG/5ML PO SOLN
5.0000 mg | Freq: Once | ORAL | Status: DC | PRN
  Filled 2014-01-18: qty 5

## 2014-01-18 MED ORDER — HYDROMORPHONE HCL PF 1 MG/ML IJ SOLN
0.2500 mg | INTRAMUSCULAR | Status: DC | PRN
  Administered 2014-01-18 (×4): 0.5 mg via INTRAVENOUS

## 2014-01-18 MED ORDER — ONDANSETRON HCL 4 MG/2ML IJ SOLN
4.0000 mg | Freq: Four times a day (QID) | INTRAMUSCULAR | Status: DC | PRN
  Administered 2014-01-19 (×2): 4 mg via INTRAVENOUS
  Filled 2014-01-18 (×2): qty 2

## 2014-01-18 MED ORDER — MIDAZOLAM HCL 2 MG/2ML IJ SOLN
INTRAMUSCULAR | Status: AC
  Filled 2014-01-18: qty 2

## 2014-01-18 MED ORDER — ALVIMOPAN 12 MG PO CAPS
12.0000 mg | ORAL_CAPSULE | Freq: Once | ORAL | Status: AC
  Administered 2014-01-18: 12 mg via ORAL
  Filled 2014-01-18: qty 1

## 2014-01-18 MED ORDER — ZOLPIDEM TARTRATE 5 MG PO TABS: 5.0000 mg | ORAL_TABLET | Freq: Every evening | ORAL | Status: DC | PRN

## 2014-01-18 MED ORDER — ALUM & MAG HYDROXIDE-SIMETH 200-200-20 MG/5ML PO SUSP: 30.0000 mL | Freq: Four times a day (QID) | ORAL | Status: DC | PRN

## 2014-01-18 MED ORDER — MIDAZOLAM HCL 5 MG/5ML IJ SOLN
INTRAMUSCULAR | Status: DC | PRN
  Administered 2014-01-18: 2 mg via INTRAVENOUS

## 2014-01-18 MED ORDER — SUCCINYLCHOLINE CHLORIDE 20 MG/ML IJ SOLN
INTRAMUSCULAR | Status: DC | PRN
  Administered 2014-01-18: 100 mg via INTRAVENOUS

## 2014-01-18 SURGICAL SUPPLY — 75 items
APPLIER CLIP 5 13 M/L LIGAMAX5 (MISCELLANEOUS)
APPLIER CLIP ROT 10 11.4 M/L (STAPLE)
BLADE SURG SZ10 CARB STEEL (BLADE) ×3 IMPLANT
CABLE HIGH FREQUENCY MONO STRZ (ELECTRODE) ×3 IMPLANT
CATH KIT ON Q 7.5IN SLV (PAIN MANAGEMENT) IMPLANT
CELLS DAT CNTRL 66122 CELL SVR (MISCELLANEOUS) ×1 IMPLANT
CLIP APPLIE 5 13 M/L LIGAMAX5 (MISCELLANEOUS) IMPLANT
CLIP APPLIE ROT 10 11.4 M/L (STAPLE) IMPLANT
COUNTER NEEDLE 20 DBL MAG RED (NEEDLE) ×3 IMPLANT
COVER MAYO STAND STRL (DRAPES) ×3 IMPLANT
DECANTER SPIKE VIAL GLASS SM (MISCELLANEOUS) ×6 IMPLANT
DRAIN CHANNEL 19F RND (DRAIN) IMPLANT
DRAPE LAPAROSCOPIC ABDOMINAL (DRAPES) ×3 IMPLANT
DRAPE LG THREE QUARTER DISP (DRAPES) ×3 IMPLANT
DRAPE UTILITY XL STRL (DRAPES) ×6 IMPLANT
DRAPE WARM FLUID 44X44 (DRAPE) ×3 IMPLANT
DRSG OPSITE POSTOP 4X10 (GAUZE/BANDAGES/DRESSINGS) IMPLANT
DRSG OPSITE POSTOP 4X6 (GAUZE/BANDAGES/DRESSINGS) IMPLANT
DRSG OPSITE POSTOP 4X8 (GAUZE/BANDAGES/DRESSINGS) IMPLANT
DRSG TEGADERM 2-3/8X2-3/4 SM (GAUZE/BANDAGES/DRESSINGS) ×6 IMPLANT
DRSG TEGADERM 4X4.75 (GAUZE/BANDAGES/DRESSINGS) IMPLANT
ELECT PENCIL ROCKER SW 15FT (MISCELLANEOUS) ×6 IMPLANT
ELECT REM PT RETURN 15FT ADLT (MISCELLANEOUS) ×3 IMPLANT
ENDOLOOP SUT PDS II  0 18 (SUTURE)
ENDOLOOP SUT PDS II 0 18 (SUTURE) IMPLANT
GAUZE SPONGE 2X2 8PLY STRL LF (GAUZE/BANDAGES/DRESSINGS) ×2 IMPLANT
GAUZE SPONGE 4X4 12PLY STRL (GAUZE/BANDAGES/DRESSINGS) ×3 IMPLANT
GLOVE ECLIPSE 8.0 STRL XLNG CF (GLOVE) ×6 IMPLANT
GLOVE INDICATOR 8.0 STRL GRN (GLOVE) ×9 IMPLANT
GOWN STRL REUS W/TWL XL LVL3 (GOWN DISPOSABLE) ×21 IMPLANT
KIT BASIN OR (CUSTOM PROCEDURE TRAY) ×9 IMPLANT
LEGGING LITHOTOMY PAIR STRL (DRAPES) ×3 IMPLANT
LUBRICANT JELLY K Y 4OZ (MISCELLANEOUS) ×3 IMPLANT
PACK GENERAL/GYN (CUSTOM PROCEDURE TRAY) ×3 IMPLANT
RELOAD STAPLER BLUE 60MM (STAPLE) ×3 IMPLANT
RTRCTR WOUND ALEXIS 18CM MED (MISCELLANEOUS) ×3
SCISSORS LAP 5X35 DISP (ENDOMECHANICALS) ×3 IMPLANT
SEALER TISSUE G2 CVD JAW 35 (ENDOMECHANICALS) IMPLANT
SEALER TISSUE G2 CVD JAW 45CM (ENDOMECHANICALS)
SEALER TISSUE G2 STRG ARTC 35C (ENDOMECHANICALS) ×3 IMPLANT
SET IRRIG TUBING LAPAROSCOPIC (IRRIGATION / IRRIGATOR) ×3 IMPLANT
SLEEVE XCEL OPT CAN 5 100 (ENDOMECHANICALS) ×9 IMPLANT
SPONGE GAUZE 2X2 STER 10/PKG (GAUZE/BANDAGES/DRESSINGS) ×4
SPONGE LAP 18X18 X RAY DECT (DISPOSABLE) ×3 IMPLANT
STAPLE ECHEON FLEX 60 POW ENDO (STAPLE) ×3 IMPLANT
STAPLER RELOAD BLUE 60MM (STAPLE) ×9
STAPLER VISISTAT 35W (STAPLE) ×3 IMPLANT
SUCTION POOLE TIP (SUCTIONS) ×3 IMPLANT
SUT MNCRL AB 4-0 PS2 18 (SUTURE) ×3 IMPLANT
SUT PDS AB 1 CTX 36 (SUTURE) ×6 IMPLANT
SUT PDS AB 1 TP1 96 (SUTURE) IMPLANT
SUT PROLENE 0 CT 2 (SUTURE) IMPLANT
SUT SILK 2 0 (SUTURE) ×2
SUT SILK 2 0 SH CR/8 (SUTURE) ×3 IMPLANT
SUT SILK 2-0 18XBRD TIE 12 (SUTURE) ×1 IMPLANT
SUT SILK 3 0 (SUTURE) ×2
SUT SILK 3 0 SH CR/8 (SUTURE) ×3 IMPLANT
SUT SILK 3-0 18XBRD TIE 12 (SUTURE) ×1 IMPLANT
SUT VLOC 180 2-0 9IN GS21 (SUTURE) ×9 IMPLANT
SYR BULB IRRIGATION 50ML (SYRINGE) ×3 IMPLANT
SYS LAPSCP GELPORT 120MM (MISCELLANEOUS)
SYSTEM LAPSCP GELPORT 120MM (MISCELLANEOUS) IMPLANT
TAPE UMBILICAL COTTON 1/8X30 (MISCELLANEOUS) IMPLANT
TOWEL OR 17X26 10 PK STRL BLUE (TOWEL DISPOSABLE) ×6 IMPLANT
TOWEL OR NON WOVEN STRL DISP B (DISPOSABLE) ×6 IMPLANT
TRAY FOLEY CATH 14FRSI W/METER (CATHETERS) ×3 IMPLANT
TRAY LAP CHOLE (CUSTOM PROCEDURE TRAY) ×3 IMPLANT
TROCAR BLADELESS OPT 5 100 (ENDOMECHANICALS) ×3 IMPLANT
TROCAR XCEL 12X100 BLDLESS (ENDOMECHANICALS) ×3 IMPLANT
TROCAR XCEL NON-BLD 11X100MML (ENDOMECHANICALS) IMPLANT
TUBING CONNECTING 10 (TUBING) IMPLANT
TUBING CONNECTING 10' (TUBING)
TUBING FILTER THERMOFLATOR (ELECTROSURGICAL) ×3 IMPLANT
TUNNELER SHEATH ON-Q 16GX12 DP (PAIN MANAGEMENT) IMPLANT
YANKAUER SUCT BULB TIP 10FT TU (MISCELLANEOUS) ×3 IMPLANT

## 2014-01-18 NOTE — Op Note (Addendum)
01/18/2014  10:28 AM  PATIENT:  [name]  73 y.o. male  Patient Care Team: Lucious Groves, DO as PCP - General (Internal Medicine) Lafayette Dragon, MD as Consulting Physician (Gastroenterology)  PRE-OPERATIVE DIAGNOSIS:  recurrent polyp of hepatic flexure colon  POST-OPERATIVE DIAGNOSIS:  recurrent polyp of hepatic flexure colon  PROCEDURE:  Procedure(s): LAPAROSCOPIC PARTIAL PROXIMAL "RIGHT" COLECTOMY  SURGEON:  Surgeon(s): Adin Hector, MD Leighton Ruff, MD - Asst  ANESTHESIA:   local and general  EBL:  Total I/O In: 2000 [I.V.:2000] Out: 85 [Urine:60; Blood:25]  Delay start of Pharmacological VTE agent (>24hrs) due to surgical blood loss or risk of bleeding:  no  DRAINS: none   SPECIMEN:  Source of Specimen:  Proximal colon (tattooed polyp at hepatic flexure)  DISPOSITION OF SPECIMEN:  PATHOLOGY  COUNTS:  YES  PLAN OF CARE: Admit to inpatient   PATIENT DISPOSITION:  PACU - hemodynamically stable.  INDICATION:    Pleasant active male with history of colon polyps.  Has a persistently recurrent polyp at the hepatic flexure.  Not able to be completely excised endoscopically.  A surgical consultation requested.  I recommended segmental resection:  The anatomy & physiology of the digestive tract was discussed.  The pathophysiology was discussed.  Natural history risks without surgery was discussed.   I worked to give an overview of the disease and the frequent need to have multispecialty involvement.  I feel the risks of no intervention will lead to serious problems that outweigh the operative risks; therefore, I recommended a partial colectomy to remove the pathology.  Laparoscopic & open techniques were discussed.   Risks such as bleeding, infection, abscess, leak, reoperation, possible ostomy, hernia, heart attack, death, and other risks were discussed.  I noted a good likelihood this will help address the problem.   Goals of post-operative recovery were discussed as  well.  We will work to minimize complications.  An educational handout on the pathology was given as well.  Questions were answered.    The patient expresses understanding & wishes to proceed with surgery.  OR FINDINGS:   Patient had Obvious tattooing of hepatic flexure.  3 cm polypoid polyp with firm base.  No obvious metastatic disease on visceral parietal peritoneum or liver.  It is an ileocolonic anastomosis that rests in the hepatic flexure.  It is a side to side stapled in-line pro-peristaltic anastomosis.  DESCRIPTION:   Informed consent was confirmed.  The patient underwent general anaesthesia without difficulty.  The patient was positioned with arms tucked & secured appropriately.  VTE prevention in place.  The patient's abdomen was clipped, prepped, & draped in a sterile fashion.  Surgical timeout confirmed our plan.  The patient was positioned in reverse Trendelenburg.  Abdominal entry was gained using optical entry technique in the  left upper abdomen.  Entry was clean.  I induced carbon dioxide insufflation.  Camera inspection revealed no injury.  Extra ports were carefully placed under direct laparoscopic visualization.  I mobilized & reflected the greater omentum and small bowel in the upper abdomen.   I was able to elevate the proximal colon to isolate the ileocolonic pedicle.  I scored the ileal mesentery just proximal to that.   I carried that further dissection in a medial to lateral fashion.  I was able to bluntly get into the retro-mesenteric plane on the right side.  I freed the proximal right sided colonic mesentery off the retroperitoneum including the duodenal sweep, pancreatic head, & Gerota's  fascia of the right kidney. I was able to get underneath the hepatic flexure.  I was able to get underneath the proximal and mid transverse colon.  I isolated the proximal ileocecal pedicle.  I skeletonized it & transected the vessels using the bipolar Enseal system.    I then  proceeded to mobilize the terminal ileum & proximal "right" colon in a lateral to medial fashion.  I mobilized the distal ileal mesentery off its retroperitoneal and pelvic attachments.  I mobilized the ascending colon off It is side wall attachments to the paracolic gutter and retroperitoneum.  I also mobilized the greater omentum off the mid transverse colon and mobilized the mid to proximal transverse colon in a superior to inferior fashion.  This allowed me to mobilize the hepatic flexure and get a complete mobilization of the proximal "right" colon to the mid-transverse colon.  I proceeded to do intracorporeal resection and anastomosis.  I resected at the distal ileum using a 60 mm Ethicon stapler.  A similar resection at the mid transverse colon.  We placed a resected: Over the liver.  I was able to bring up the ileum up towards the mid transverse colon.  Dilated so that it was pro peristaltic and not anti peristolic.  We ablated in the midline.  I then did a colotomy 7 cm distal to the end colon staple line.and enterotomy 2 cm proximal to the end ileum..  I did a side-to-side stapled anastomosis from the end ileum to colon. I sutured closed the common stapled defect using a 2-0 V-lock serrated suture running from each corner and meeting in the center.  There was some exposed mucosa on the colon side.  I ran another layer incorporating some epiploic appendages and ileal mesentery to help cover the anastomosis.  The anastomosis laid well.  The anastomosis looked healthy.  I placed a wound protector through a periumbilical midline 5cm incision in the supraumbilical region.  With that, I was able to eviserate the distal ileum and proximal colon resected specimen.  We did reinspection of the abdomen.  Hemostasis was good.   Bowel uninjured.     We did a final irrigation of antibiotic solution (900 mg clindamycin/240 mg gentamicin in a liter of crystalloid) & held that for 10 minutes while we removed the  wound protector of the Gelport.  I opened up the hepatic flexure the colon confirmed the recurrent polyp had been resected with good margins near the tattooing.  We changed gown & gloves.The patient was re-draped.  Sterile unused instruments were used from this point out per colon SSI prevention protocol.       We reinspected the abdomen.  Hemostasis was good.   No injury.  The anastomosis looked healthy.    I closed the 12 mm left flank port site with 0 Vicryl suture the  I closed the 76mm port sites using Monocryl stitch and sterile dressing.  I closed the midline incision using #1 PDS running closure. I placed sterile dressing.    Patient is being extubated go to recovery room. I discussed postop care with the patient in detail the office & in the holding area. Instructions are written. I updated the patient's status to the family.  Recommendations were made.  Questions were answered.  The family expressed understanding & appreciation.

## 2014-01-18 NOTE — H&P (Addendum)
[facility]  [address] [place], [place] [address] Phone: [phone] FAX: [phone]     COSTANTINO KOHLBECK  05-Jul-1940 [id number]  CARE TEAM:  PCP: Lucious Groves, DO  Outpatient Care Team: Patient Care Team: Lucious Groves, DO as PCP - General (Internal Medicine) Lafayette Dragon, MD as Consulting Physician (Gastroenterology)  Inpatient Treatment Team: Treatment Team: Attending Provider: Adin Hector, MD  Reason for visit: Recurrent polyp at hepatic flexure of colon   Pleasant active male. History of polyp at hepatic flexure. Endoscopic resection attempted 2003. Small perforation. Treated with antibiotics. Repeat colonoscopy noted recurrence 2010. Resected. Repeat endoscopic evaluation notes recurrence again last month. Seems more bulky. Biopsy consistent with tubulovillous adenoma. Felt not to be resected endoscopically. A surgical consultation requested.   Patient has bowel movement every other day. Can walk 30 minutes slowly without difficulty. No history of coronary or pulmonary issues. No history of stroke. Has not smoked tobacco for 15 years. He has never had any abdominal or hernia surgery. Takes a baby aspirin for general cardiac protection.   No personal nor family history of GI/colon cancer, inflammatory bowel disease, irritable bowel syndrome, allergy such as Celiac Sprue, dietary/dairy problems, colitis, ulcers nor gastritis. No recent sick contacts/gastroenteritis. No travel outside the country. No changes in diet. No dysphagia to solids or liquids. No significant heartburn or reflux. No hematochezia, hematemesis, coffee ground emesis. No evidence of prior gastric/peptic ulceration.      Past Medical History  Diagnosis Date  . Hyperlipidemia   . 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 GERD (gastroesophageal reflux disease)   . Colonic polyp     Tubulovillous adenomatous  . Tobacco abuse     quit 2001  . Alcohol abuse   . Gout  12/2007    inadequate aspiration of MTP joint left great toe  06/18/08; -fluid:  red, bloody, cell count not performed (TNP) and no crystals or organisms seen.  [name] COPD (chronic obstructive pulmonary disease)   . Insomnia     Past Surgical History  Procedure Laterality Date  . Wrist surgery Left   . Colonoscopy    . Dupuytren contracture release Right   . Cataract extraction Bilateral   . Neck surgery  1970    removal cyst outside neck    History   Social History  . Marital Status: Widowed    Spouse Name: N/A    Number of Children: 1  . Years of Education: N/A   Occupational History  . Retired    Social History Main Topics  . Smoking status: Former Smoker    Types: Cigarettes    Quit date: 08/27/2000  . Smokeless tobacco: Never Used  . Alcohol Use: No     Comment: none x 7 years  . Drug Use: No  . Sexual Activity: Not on file   Other Topics Concern  . Not on file   Social History Narrative  . No narrative on file    Family History  Problem Relation Age of Onset  . Colon cancer Neg Hx   . Pancreatic cancer Neg Hx   . Stomach cancer Neg Hx   . Rectal cancer Neg Hx     Current Facility-Administered Medications  Medication Dose Route Frequency Provider Last Rate Last Dose  . bupivacaine 0.25 % ON-Q pump DUAL CATH 300 mL  300 mL Other Continuous Adin Hector, MD      . cefoTEtan (CEFOTAN) 2 g in dextrose  5 % 50 mL IVPB  2 g Intravenous On Call to [place], MD      . clindamycin (CLEOCIN) 900 mg, gentamicin (GARAMYCIN) 240 mg in sodium chloride 0.9 % 1,000 mL for intraperitoneal lavage   Intraperitoneal To OR Adin Hector, MD       Facility-Administered Medications Ordered in Other Encounters  Medication Dose Route Frequency Provider Last Rate Last Dose  . lactated ringers infusion    Continuous PRN Lacey A Armistead, CRNA         No Known Allergies  ROS: Constitutional:  No fevers, chills, sweats.  Weight stable Eyes:  No vision changes, No  discharge HENT:  No sore throats, nasal drainage Lymph: No neck swelling, No bruising easily Pulmonary:  No cough, productive sputum CV: No orthopnea, PND  No exertional chest/neck/shoulder/arm pain. GI:  No personal nor family history of inflammatory bowel disease, irritable bowel syndrome, allergy such as Celiac Sprue, dietary/dairy problems, colitis, ulcers nor gastritis.  No recent sick contacts/gastroenteritis.  No travel outside the country.  No changes in diet. Renal: No UTIs, No hematuria Genital:  No drainage, bleeding, masses Musculoskeletal: No severe joint pain.  Good ROM major joints Skin:  No sores or lesions.  No rashes Heme/Lymph:  No easy bleeding.  No swollen lymph nodes Neuro: No focal weakness/numbness.  No seizures Psych: No suicidal ideation.  No hallucinations  BP 120/76  Pulse 90  Temp(Src) 97.6 F (36.4 C) (Oral)  Resp 16  SpO2 97%  Physical Exam: General: Pt awake/alert/oriented x4 in no major acute distress Eyes: PERRL, normal EOM. Sclera nonicteric Neuro: CN II-XII intact w/o focal sensory/motor deficits. Lymph: No head/neck/groin lymphadenopathy Psych:  No delerium/psychosis/paranoia HENT: Normocephalic, Mucus membranes moist.  No thrush Neck: Supple, No tracheal deviation Chest: No pain.  Good respiratory excursion. CV:  Pulses intact.  Regular rhythm Abdomen: Soft, Nondistended.  Nontender.  No incarcerated hernias. Ext:  SCDs BLE.  No significant edema.  No cyanosis Skin: No petechiae / purpurea.  No major sores Musculoskeletal: No severe joint pain.  Good ROM major joints   Results:   Labs: No results found for this or any previous visit (from the past 48 hour(s)).  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 Chronic background parenchymal scarring and apical thickening. Background emphysema suspected. No definite superimposed pneumonia, collapse, consolidation, or edema. No effusion or pneumothorax. Trachea midline.  IMPRESSION: Extensive bilateral calcified pleural plaques.  Stable parenchymal scarring and COPD.  No acute superimposed process   Electronically Signed   By: Daryll Brod M.D.   On: 01/11/2014 15:34    Medications / Allergies: per chart  Antibiotics: Anti-infectives   Start     Dose/Rate Route Frequency Ordered Stop   01/18/14 0600  clindamycin (CLEOCIN) 900 mg, gentamicin (GARAMYCIN) 240 mg in sodium chloride 0.9 % 1,000 mL for intraperitoneal lavage    Comments:  Pharmacy may adjust dosing strength, schedule, rate of infusion, etc as needed to optimize therapy    Intraperitoneal To Surgery 01/17/14 1442 01/19/14 0600   01/18/14 0559  cefoTEtan (CEFOTAN) 2 g in dextrose 5 % 50 mL IVPB     2 g 100 mL/hr over 30 Minutes Intravenous On call to O.R. 01/18/14 0559 01/19/14 0559  Assessment  Kyra Leyland  73 y.o. male  Day of Surgery  Procedure(s): LAPAROSCOPIC, POSSIBLE OPEN,  PARTIAL RIGHT COLECTOMY  Problem List:  Active Problems:   * No active hospital problems. *   Recurrent had adenomatous polyp at hepatic flexure refractory to prior endoscopic resections   Plan:   That he would benefit from segmental colonic resection. Probable proximal right colectomy. Regional minimally invasive approach. Will try intracorporeal in-line anastomosis to decrease incisional pain and postoperative ileus:   The anatomy & physiology of the digestive tract was discussed. The pathophysiology of the colon was discussed. Natural history risks without surgery was discussed. I feel the risks of no intervention will lead to serious problems that outweigh the operative risks; therefore, I recommended a partial colectomy to remove the pathology. Minimally invasive  (Robotic/Laparoscopic) & open techniques were discussed.   Risks such as bleeding, infection, abscess, leak, reoperation, possible ostomy, hernia, heart attack, death, and other risks were discussed. I noted a good likelihood this will help address the problem. Goals of post-operative recovery were discussed as well. Need for bowel regimen and healthy physical activity to optimize recovery noted as well. We will work to minimize complications. Educational materials were given as well. Questions were answered. The patient expresses understanding & wishes to proceed with surgery.      -VTE prophylaxis- SCDs, etc -mobilize as tolerated to help recovery    Adin Hector, M.D., F.A.C.S. Gastrointestinal and Minimally Invasive Surgery [facility] [address], [place], [place] [address] [phone] Main / Paging   01/18/2014  Note: Portions of this report may have been transcribed using voice recognition software. Every effort was made to ensure accuracy; however, inadvertent computerized transcription errors may be present.   Any transcriptional errors that result from this process are unintentional.

## 2014-01-18 NOTE — Anesthesia Postprocedure Evaluation (Signed)
Anesthesia Post Note  Patient: [name]  Procedure(s) Performed: Procedure(s) (LRB): LAPAROSCOPIC PARTIAL RIGHT COLECTOMY (Right)  Anesthesia type: General  Patient location: PACU  Post pain: Pain level controlled  Post assessment: Post-op Vital signs reviewed  Last Vitals: BP 124/67  Pulse 84  Temp(Src) 36.4 C (Oral)  Resp 16  SpO2 96%  Post vital signs: Reviewed  Level of consciousness: sedated  Complications: No apparent anesthesia complications

## 2014-01-18 NOTE — Discharge Instructions (Signed)
ABDOMINAL SURGERY: POST OP INSTRUCTIONS ° °1. DIET: Follow a light bland diet the first 24 hours after arrival home, such as soup, liquids, crackers, etc.  Be sure to include lots of fluids daily.  Avoid fast food or heavy meals as your are more likely to get nauseated.  Eat a low fat the next few days after surgery.   °2. Take your usually prescribed home medications unless otherwise directed. °3. PAIN CONTROL: °a. Pain is best controlled by a usual combination of three different methods TOGETHER: °i. Ice/Heat °ii. Over the counter pain medication °iii. Prescription pain medication °b. Most patients will experience some swelling and bruising around the incisions.  Ice packs or heating pads (30-60 minutes up to 6 times a day) will help. Use ice for the first few days to help decrease swelling and bruising, then switch to heat to help relax tight/sore spots and speed recovery.  Some people prefer to use ice alone, heat alone, alternating between ice & heat.  Experiment to what works for you.  Swelling and bruising can take several weeks to resolve.   °c. It is helpful to take an over-the-counter pain medication regularly for the first few weeks.  Choose one of the following that works best for you: °i. Naproxen (Aleve, etc)  Two 220mg tabs twice a day °ii. Ibuprofen (Advil, etc) Three 200mg tabs four times a day (every meal & bedtime) °iii. Acetaminophen (Tylenol, etc) 500-650mg four times a day (every meal & bedtime) °d. A  prescription for pain medication (such as oxycodone, hydrocodone, etc) should be given to you upon discharge.  Take your pain medication as prescribed.  °i. If you are having problems/concerns with the prescription medicine (does not control pain, nausea, vomiting, rash, itching, etc), please call us [phone] to see if we need to switch you to a different pain medicine that will work better for you and/or control your side effect better. °ii. If you need a refill on your pain medication,  please contact your pharmacy.  They will contact our office to request authorization. Prescriptions will not be filled after 5 pm or on week-ends. °4. Avoid getting constipated.  Between the surgery and the pain medications, it is common to experience some constipation.  Increasing fluid intake and taking a fiber supplement (such as Metamucil, Citrucel, FiberCon, MiraLax, etc) 1-2 times a day regularly will usually help prevent this problem from occurring.  A mild laxative (prune juice, Milk of Magnesia, MiraLax, etc) should be taken according to package directions if there are no bowel movements after 48 hours.   °5. Watch out for diarrhea.  If you have many loose bowel movements, simplify your diet to bland foods & liquids for a few days.  Stop any stool softeners and decrease your fiber supplement.  Switching to mild anti-diarrheal medications (Kayopectate, Pepto Bismol) can help.  If this worsens or does not improve, please call us. °6. Wash / shower every day.  You may shower over the incision / wound.  Avoid baths until the skin is fully healed.  Continue to shower over incision(s) after the dressing is off. °7. Remove your waterproof bandages 5 days after surgery.  You may leave the incision open to air.  You may replace a dressing/Band-Aid to cover the incision for comfort if you wish. °8. ACTIVITIES as tolerated:   °a. You may resume regular (light) daily activities beginning the next day--such as daily self-care, walking, climbing stairs--gradually increasing activities as tolerated.  If you can   walk 30 minutes without difficulty, it is safe to try more intense activity such as jogging, treadmill, bicycling, low-impact aerobics, swimming, etc. °b. Save the most intensive and strenuous activity for last such as sit-ups, heavy lifting, contact sports, etc  Refrain from any heavy lifting or straining until you are off narcotics for pain control.   °c. DO NOT PUSH THROUGH PAIN.  Let pain be your guide: If it  hurts to do something, don't do it.  Pain is your body warning you to avoid that activity for another week until the pain goes down. °d. You may drive when you are no longer taking prescription pain medication, you can comfortably wear a seatbelt, and you can safely maneuver your car and apply brakes. °e. You may have sexual intercourse when it is comfortable.  °9. FOLLOW UP in our office °a. Please call [facility] at [phone] to set up an appointment to see your surgeon in the office for a follow-up appointment approximately 1-2 weeks after your surgery. °b. Make sure that you call for this appointment the day you arrive home to insure a convenient appointment time. °10. IF YOU HAVE DISABILITY OR FAMILY LEAVE FORMS, BRING THEM TO THE OFFICE FOR PROCESSING.  DO NOT GIVE THEM TO YOUR DOCTOR. ° ° °WHEN TO CALL US [phone]: °1. Poor pain control °2. Reactions / problems with new medications (rash/itching, nausea, etc)  °3. Fever over 101.5 F (38.5 C) °4. Inability to urinate °5. Nausea and/or vomiting °6. Worsening swelling or bruising °7. Continued bleeding from incision. °8. Increased pain, redness, or drainage from the incision ° °The clinic staff is available to answer your questions during regular business hours (8:30am-5pm).  Please don’t hesitate to call and ask to speak to one of our nurses for clinical concerns.   A surgeon from [facility] is always on call at the hospitals °  °If you have a medical emergency, go to the nearest emergency room or call 911. °  ° °[facility] °[address], [address], [place], [place]  [address] ? °MAIN: [phone] ? TOLL FREE: [phone] ? °FAX [phone] °[email or web address] ° °GETTING TO GOOD BOWEL HEALTH. °Irregular bowel habits such as constipation and diarrhea can lead to many problems over time.  Having one soft bowel movement a day is the most important way to prevent further problems.  The anorectal canal  is designed to handle stretching and feces to safely manage our ability to get rid of solid waste (feces, poop, stool) out of our body.  BUT, hard constipated stools can act like ripping concrete bricks and diarrhea can be a burning fire to this very sensitive area of our body, causing inflamed hemorrhoids, anal fissures, increasing risk is perirectal abscesses, abdominal pain/bloating, an making irritable bowel worse.     °The goal: ONE SOFT BOWEL MOVEMENT A DAY!  To have soft, regular bowel movements:  °  Drink at least 8 tall glasses of water a day.   °  Take plenty of fiber.  Fiber is the undigested part of plant food that passes into the colon, acting s “natures broom” to encourage bowel motility and movement.  Fiber can absorb and hold large amounts of water. This results in a larger, bulkier stool, which is soft and easier to pass. Work gradually over several weeks up to 6 servings a day of fiber (25g a day even more if needed) in the form of: °o Vegetables -- Root (potatoes, carrots, turnips),   leafy green (lettuce, salad greens, celery, spinach), or cooked high residue (cabbage, broccoli, etc) °o Fruit -- Fresh (unpeeled skin & pulp), Dried (prunes, apricots, cherries, etc ),  or stewed ( applesauce)  °o Whole grain breads, pasta, etc (whole wheat)  °o Bran cereals  °  Bulking Agents -- This type of water-retaining fiber generally is easily obtained each day by one of the following:  °o Psyllium bran -- The psyllium plant is remarkable because its ground seeds can retain so much water. This product is available as Metamucil, Konsyl, Effersyllium, Per Diem Fiber, or the less expensive generic preparation in drug and health food stores. Although labeled a laxative, it really is not a laxative.  °o Methylcellulose -- This is another fiber derived from wood which also retains water. It is available as Citrucel. °o Polyethylene Glycol - and “artificial” fiber commonly called Miralax or Glycolax.  It is helpful  for people with gassy or bloated feelings with regular fiber °o Flax Seed - a less gassy fiber than psyllium °  No reading or other relaxing activity while on the toilet. If bowel movements take longer than 5 minutes, you are too constipated °  AVOID CONSTIPATION.  High fiber and water intake usually takes care of this.  Sometimes a laxative is needed to stimulate more frequent bowel movements, but  °  Laxatives are not a good long-term solution as it can wear the colon out. °o Osmotics (Milk of Magnesia, Fleets phosphosoda, Magnesium citrate, MiraLax, GoLytely) are safer than  °o Stimulants (Senokot, Castor Oil, Dulcolax, Ex Lax)    °o Do not take laxatives for more than 7days in a row. °   IF SEVERELY CONSTIPATED, try a Bowel Retraining Program: °o Do not use laxatives.  °o Eat a diet high in roughage, such as bran cereals and leafy vegetables.  °o Drink six (6) ounces of prune or apricot juice each morning.  °o Eat two (2) large servings of stewed fruit each day.  °o Take one (1) heaping tablespoon of a psyllium-based bulking agent twice a day. Use sugar-free sweetener when possible to avoid excessive calories.  °o Eat a normal breakfast.  °o Set aside 15 minutes after breakfast to sit on the toilet, but do not strain to have a bowel movement.  °o If you do not have a bowel movement by the third day, use an enema and repeat the above steps.  °  Controlling diarrhea °o Switch to liquids and simpler foods for a few days to avoid stressing your intestines further. °o Avoid dairy products (especially milk & ice cream) for a short time.  The intestines often can lose the ability to digest lactose when stressed. °o Avoid foods that cause gassiness or bloating.  Typical foods include beans and other legumes, cabbage, broccoli, and dairy foods.  Every person has some sensitivity to other foods, so listen to our body and avoid those foods that trigger problems for you. °o Adding fiber (Citrucel, Metamucil, psyllium,  Miralax) gradually can help thicken stools by absorbing excess fluid and retrain the intestines to act more normally.  Slowly increase the dose over a few weeks.  Too much fiber too soon can backfire and cause cramping & bloating. °o Probiotics (such as active yogurt, Align, etc) may help repopulate the intestines and colon with normal bacteria and calm down a sensitive digestive tract.  Most studies show it to be of mild help, though, and such products can be costly. °o Medicines: °  Bismuth   subsalicylate (ex. Kayopectate, Pepto Bismol) every 30 minutes for up to 6 doses can help control diarrhea.  Avoid if pregnant.   Loperamide (Immodium) can slow down diarrhea.  Start with two tablets (4mg  total) first and then try one tablet every 6 hours.  Avoid if you are having fevers or severe pain.  If you are not better or start feeling worse, stop all medicines and call your doctor for advice o Call your doctor if you are getting worse or not better.  Sometimes further testing (cultures, endoscopy, X-ray studies, bloodwork, etc) may be needed to help diagnose and treat the cause of the diarrhea.  Colon Polyps Polyps are lumps of extra tissue growing inside the body. Polyps can grow in the large intestine (colon). Most colon polyps are noncancerous (benign). However, some colon polyps can become cancerous over time. Polyps that are larger than a pea may be harmful. To be safe, caregivers remove and test all polyps. CAUSES  Polyps form when mutations in the genes cause your cells to grow and divide even though no more tissue is needed. RISK FACTORS There are a number of risk factors that can increase your chances of getting colon polyps. They include:  Being older than 50 years.  Family history of colon polyps or colon cancer.  Long-term colon diseases, such as colitis or Crohn disease.  Being overweight.  Smoking.  Being inactive.  Drinking too much alcohol. SYMPTOMS  Most small polyps do not  cause symptoms. If symptoms are present, they may include:  Blood in the stool. The stool may look dark red or black.  Constipation or diarrhea that lasts longer than 1 week. DIAGNOSIS People often do not know they have polyps until their caregiver finds them during a regular checkup. Your caregiver can use 4 tests to check for polyps:  Digital rectal exam. The caregiver wears gloves and feels inside the rectum. This test would find polyps only in the rectum.  Barium enema. The caregiver puts a liquid called barium into your rectum before taking X-rays of your colon. Barium makes your colon look white. Polyps are dark, so they are easy to see in the X-ray pictures.  Sigmoidoscopy. A thin, flexible tube (sigmoidoscope) is placed into your rectum. The sigmoidoscope has a light and tiny camera in it. The caregiver uses the sigmoidoscope to look at the last third of your colon.  Colonoscopy. This test is like sigmoidoscopy, but the caregiver looks at the entire colon. This is the most common method for finding and removing polyps. TREATMENT  Any polyps will be removed during a sigmoidoscopy or colonoscopy. The polyps are then tested for cancer. PREVENTION  To help lower your risk of getting more colon polyps:  Eat plenty of fruits and vegetables. Avoid eating fatty foods.  Do not smoke.  Avoid drinking alcohol.  Exercise every day.  Lose weight if recommended by your caregiver.  Eat plenty of calcium and folate. Foods that are rich in calcium include milk, cheese, and broccoli. Foods that are rich in folate include chickpeas, kidney beans, and spinach. HOME CARE INSTRUCTIONS Keep all follow-up appointments as directed by your caregiver. You may need periodic exams to check for polyps. SEEK MEDICAL CARE IF: You notice bleeding during a bowel movement. Document Released: 02/19/2004 Document Revised: 08/17/2011 Document Reviewed: 08/04/2011 [facility] Patient Information 2015 [place],  [place]. This information is not intended to replace advice given to you by your health care provider. Make sure you discuss any questions you have with  your health care provider.  Managing Pain  Pain after surgery or related to activity is often due to strain/injury to muscle, tendon, nerves and/or incisions.  This pain is usually short-term and will improve in a few months.   Many people find it helpful to do the following things TOGETHER to help speed the process of healing and to get back to regular activity more quickly:  1. Avoid heavy physical activity a.  no lifting greater than 20 pounds b. Do not push through the pain.  Listen to your body and avoid positions and maneuvers than reproduce the pain c. Walking is okay as tolerated, but go slowly and stop when getting sore.  d. Remember: If it hurts to do it, then dont do it! 2. Take Anti-inflammatory medication  a. Take with food/snack around the clock for 1-2 weeks i. This helps the muscle and nerve tissues become less irritable and calm down faster b. Choose ONE of the following over-the-counter medications: i. Naproxen 220mg  tabs (ex. Aleve) 1-2 pills twice a day  ii. Ibuprofen 200mg  tabs (ex. Advil, Motrin) 3-4 pills with every meal and just before bedtime iii. Acetaminophen 500mg  tabs (Tylenol) 1-2 pills with every meal and just before bedtime 3. Use a Heating pad or Ice/Cold Pack a. 4-6 times a day b. May use warm bath/hottub  or showers 4. Try Gentle Massage and/or Stretching  a. at the area of pain many times a day b. stop if you feel pain - do not overdo it  Try these steps together to help you body heal faster and avoid making things get worse.  Doing just one of these things may not be enough.    If you are not getting better after two weeks or are noticing you are getting worse, contact our office for further advice; we may need to re-evaluate you & see what other things we can do to help.

## 2014-01-18 NOTE — Transfer of Care (Signed)
Immediate Anesthesia Transfer of Care Note  Patient: [name]  Procedure(s) Performed: Procedure(s): LAPAROSCOPIC PARTIAL RIGHT COLECTOMY (Right)  Patient Location: PACU  Anesthesia Type:General  Level of Consciousness: awake, alert , oriented and patient cooperative  Airway & Oxygen Therapy: Patient Spontanous Breathing and Patient connected to face mask oxygen  Post-op Assessment: Report given to PACU RN, Post -op Vital signs reviewed and stable and Patient moving all extremities  Post vital signs: Reviewed and stable  Complications: No apparent anesthesia complications

## 2014-01-18 NOTE — Progress Notes (Signed)
PRN LR bolus given per order for urine  Output of less than 120 in 4 hours.

## 2014-01-19 ENCOUNTER — Encounter (HOSPITAL_COMMUNITY): Payer: Self-pay | Admitting: Surgery

## 2014-01-19 LAB — BASIC METABOLIC PANEL
ANION GAP: 16 — AB (ref 5–15)
BUN: 13 mg/dL (ref 6–23)
CHLORIDE: 105 meq/L (ref 96–112)
CO2: 19 mEq/L (ref 19–32)
CREATININE: 1.07 mg/dL (ref 0.50–1.35)
Calcium: 8.8 mg/dL (ref 8.4–10.5)
GFR calc non Af Amer: 67 mL/min — ABNORMAL LOW (ref >90)
GFR, EST AFRICAN AMERICAN: 78 mL/min — AB (ref >90)
Glucose, Bld: 126 mg/dL — ABNORMAL HIGH (ref 70–99)
POTASSIUM: 4.7 meq/L (ref 3.7–5.3)
Sodium: 140 mEq/L (ref 137–147)

## 2014-01-19 LAB — CBC
HEMATOCRIT: 41.3 % (ref 39.0–52.0)
HEMOGLOBIN: 13.9 g/dL (ref 13.0–17.0)
MCH: 31.2 pg (ref 26.0–34.0)
MCHC: 33.7 g/dL (ref 30.0–36.0)
MCV: 92.6 fL (ref 78.0–100.0)
Platelets: 190 10*3/uL (ref 150–400)
RBC: 4.46 MIL/uL (ref 4.22–5.81)
RDW: 13.3 % (ref 11.5–15.5)
WBC: 25.3 10*3/uL — ABNORMAL HIGH (ref 4.0–10.5)

## 2014-01-19 LAB — MAGNESIUM: MAGNESIUM: 1.7 mg/dL (ref 1.5–2.5)

## 2014-01-19 MED ORDER — FOLIC ACID 1 MG PO TABS
1.0000 mg | ORAL_TABLET | Freq: Every day | ORAL | Status: DC
  Administered 2014-01-19 – 2014-01-21 (×3): 1 mg via ORAL
  Filled 2014-01-19 (×3): qty 1

## 2014-01-19 MED ORDER — THIAMINE HCL 100 MG/ML IJ SOLN
100.0000 mg | Freq: Every day | INTRAMUSCULAR | Status: DC
  Administered 2014-01-19 – 2014-01-20 (×2): 100 mg via INTRAVENOUS
  Filled 2014-01-19 (×3): qty 1

## 2014-01-19 MED ORDER — VITAMIN B-1 100 MG PO TABS
100.0000 mg | ORAL_TABLET | Freq: Every day | ORAL | Status: DC
  Administered 2014-01-21: 100 mg via ORAL
  Filled 2014-01-19 (×3): qty 1

## 2014-01-19 MED ORDER — LORAZEPAM 2 MG/ML IJ SOLN
1.0000 mg | Freq: Four times a day (QID) | INTRAMUSCULAR | Status: DC | PRN
  Administered 2014-01-19: 1 mg via INTRAVENOUS
  Filled 2014-01-19: qty 1

## 2014-01-19 MED ORDER — ADULT MULTIVITAMIN W/MINERALS CH
1.0000 | ORAL_TABLET | Freq: Every day | ORAL | Status: DC
  Administered 2014-01-19 – 2014-01-21 (×3): 1 via ORAL
  Filled 2014-01-19 (×4): qty 1

## 2014-01-19 MED ORDER — LORAZEPAM 1 MG PO TABS
1.0000 mg | ORAL_TABLET | Freq: Four times a day (QID) | ORAL | Status: DC | PRN
Start: 2014-01-19 — End: 2014-01-21
  Administered 2014-01-19 – 2014-01-20 (×2): 1 mg via ORAL
  Filled 2014-01-19: qty 2
  Filled 2014-01-19: qty 1

## 2014-01-19 MED ORDER — FENTANYL CITRATE 0.05 MG/ML IJ SOLN
25.0000 ug | INTRAMUSCULAR | Status: DC | PRN
  Administered 2014-01-19 – 2014-01-20 (×2): 25 ug via INTRAVENOUS
  Filled 2014-01-19 (×2): qty 2

## 2014-01-19 NOTE — Progress Notes (Signed)
[name]  [address] [place], [place] [address] Phone: [phone] FAX: [place] [phone] [birth year]  CARE TEAM:  PCP: Lucious Groves, DO  Outpatient Care Team: Patient Care Team: Lucious Groves, DO as PCP - General (Internal Medicine) Lafayette Dragon, MD as Consulting Physician (Gastroenterology)  Inpatient Treatment Team: Treatment Team: Attending Provider: Adin Hector, MD; Registered Nurse: Eugenie Birks, RN; Registered Nurse: Claretta Fraise, RN; Registered Nurse: Kayleen Memos, RN   Subjective:  "I feel wonderful" Confused Wife & RN in room Walked in hallways  Objective:  Vital signs:  Filed Vitals:   01/18/14 2233 01/19/14 0213 01/19/14 0500 01/19/14 0620  BP:  119/59  112/57  Pulse:  81  73  Temp:  98 F (36.7 C)  97.6 F (36.4 C)  TempSrc:  Oral  Oral  Resp:  18  18  Height:   5' 7.2" (1.707 m)   Weight: 140 lb 6.9 oz (63.7 kg)  140 lb 6.9 oz (63.7 kg)   SpO2:  94%  94%    Last BM Date: 01/17/14  Intake/Output   Yesterday:  08/13 0701 - 08/14 0700 In: 4388.3 [P.O.:1080; I.V.:3258.3; IV Piggyback:50] Out: 1440 [Urine:1415; Blood:25] This shift:     Bowel function:  Flatus: n  BM: n  Drain: n/a  Physical Exam:  General: Pt awake/alert/oriented x4 in no acute distress Eyes: PERRL, normal EOM.  Sclera clear.  No icterus Neuro: CN II-XII intact w/o focal sensory/motor deficits. Lymph: No head/neck/groin lymphadenopathy Psych:  No delerium/psychosis/paranoia.  Mildly confused HENT: Normocephalic, Mucus membranes moist.  No thrush Neck: Supple, No tracheal deviation Chest: No chest wall pain w good excursion CV:  Pulses intact.  Regular rhythm MS: Normal AROM mjr joints.  No obvious deformity Abdomen: Soft.  Nondistended.  Mildly tender at incisions only.  No evidence of peritonitis.  No incarcerated hernias. Ext:  SCDs BLE.  No mjr edema.  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 male  1 Day Post-Op  Procedure(s): LAPAROSCOPIC PARTIAL RIGHT COLECTOMY  Recovering OK  Plan:  -adv diet gradually -switch narcotics & follow mental status -follow WBC - ?steroids given intraop -f/u path -CIWA protocol given h/o moderate EtOH use -VTE prophylaxis- SCDs, etc -mobilize as tolerated to help recovery  Adin Hector, M.D., F.A.C.S. Gastrointestinal and Minimally Invasive Surgery [facility] [address] [place], [place] [address] [phone] Main / Paging   01/19/2014   Results:   Labs: Results for orders placed during the hospital encounter of 01/18/14 (from the past 48 hour(s))  BASIC METABOLIC PANEL     Status: Abnormal   Collection Time    01/19/14  4:20 AM      Result Value Ref Range   Sodium 140  137 - 147 mEq/L   Potassium 4.7  3.7 - 5.3 mEq/L   Chloride 105  96 - 112 mEq/L   CO2 19  19 - 32 mEq/L   Glucose, Bld 126 (*) 70 - 99 mg/dL   BUN 13  6 - 23 mg/dL   Creatinine, Ser 1.07  0.50 - 1.35 mg/dL   Calcium 8.8  8.4 -  10.5 mg/dL   GFR calc non Af Amer 67 (*) >90 mL/min   GFR calc Af Amer 78 (*) >90 mL/min   Comment: (NOTE)     The eGFR has been calculated using the CKD EPI equation.     This calculation has not been validated in all clinical situations.     eGFR's persistently <90 mL/min signify possible Chronic Kidney     Disease.   Anion gap 16 (*) 5 - 15  CBC     Status: Abnormal   Collection Time    01/19/14  4:20 AM      Result Value Ref Range   WBC 25.3 (*) 4.0 - 10.5 K/uL   RBC 4.46  4.22 - 5.81 MIL/uL   Hemoglobin 13.9  13.0 - 17.0 g/dL   HCT 41.3  39.0 - 52.0 %   MCV 92.6  78.0 - 100.0 fL   MCH 31.2  26.0 - 34.0 pg   MCHC 33.7  30.0  - 36.0 g/dL   RDW 13.3  11.5 - 15.5 %   Platelets 190  150 - 400 K/uL  MAGNESIUM     Status: None   Collection Time    01/19/14  4:20 AM      Result Value Ref Range   Magnesium 1.7  1.5 - 2.5 mg/dL    Imaging / Studies: No results found.  Medications / Allergies: per chart  Antibiotics: Anti-infectives   Start     Dose/Rate Route Frequency Ordered Stop   01/18/14 2000  cefoTEtan (CEFOTAN) 2 g in dextrose 5 % 50 mL IVPB     2 g 100 mL/hr over 30 Minutes Intravenous Every 12 hours 01/18/14 1141 01/18/14 2031   01/18/14 0600  clindamycin (CLEOCIN) 900 mg, gentamicin (GARAMYCIN) 240 mg in sodium chloride 0.9 % 1,000 mL for intraperitoneal lavage  Status:  Discontinued    Comments:  Pharmacy may adjust dosing strength, schedule, rate of infusion, etc as needed to optimize therapy    Intraperitoneal To Surgery 01/17/14 1442 01/18/14 1128   01/18/14 0559  cefoTEtan (CEFOTAN) 2 g in dextrose 5 % 50 mL IVPB     2 g 100 mL/hr over 30 Minutes Intravenous On call to O.R. 01/18/14 0559 01/18/14 0743       Note: Portions of this report may have been transcribed using voice recognition software. Every effort was made to ensure accuracy; however, inadvertent computerized transcription errors may be present.   Any transcriptional errors that result from this process are unintentional.

## 2014-01-20 LAB — CBC
HEMATOCRIT: 38.9 % — AB (ref 39.0–52.0)
Hemoglobin: 13.6 g/dL (ref 13.0–17.0)
MCH: 31.3 pg (ref 26.0–34.0)
MCHC: 35 g/dL (ref 30.0–36.0)
MCV: 89.4 fL (ref 78.0–100.0)
Platelets: 162 10*3/uL (ref 150–400)
RBC: 4.35 MIL/uL (ref 4.22–5.81)
RDW: 13.2 % (ref 11.5–15.5)
WBC: 12.4 10*3/uL — AB (ref 4.0–10.5)

## 2014-01-20 LAB — CREATININE, SERUM
CREATININE: 1.07 mg/dL (ref 0.50–1.35)
GFR calc Af Amer: 78 mL/min — ABNORMAL LOW (ref >90)
GFR, EST NON AFRICAN AMERICAN: 67 mL/min — AB (ref >90)

## 2014-01-20 LAB — POTASSIUM: Potassium: 4.2 mEq/L (ref 3.7–5.3)

## 2014-01-20 NOTE — Progress Notes (Signed)
Patient ID: [name], male   DOB: [birth year], 73 y.o.   MRN: [id number] 2 Days Post-Op  Subjective: Patient is very confused. He is able to deny nausea or pain currently. Tolerating some soft diet per patient and family. Report she has had flatus and bowel movement.  Objective: Vital signs in last 24 hours: Temp:  [97.8 F (36.6 C)-98.4 F (36.9 C)] 98.4 F (36.9 C) (08/15 0007) Pulse Rate:  [79-87] 87 (08/15 0007) Resp:  [18-20] 19 (08/15 0007) BP: (142-153)/(63-71) 142/71 mmHg (08/15 0007) SpO2:  [97 %-98 %] 97 % (08/15 1014) Last BM Date: 01/17/14  Intake/Output from previous day: 08/14 0701 - 08/15 0700 In: 1192.5 [P.O.:600; I.V.:592.5] Out: 425 [Urine:425] Intake/Output this shift: Total I/O In: -  Out: 100 [Urine:100]  General appearance: alert, no distress and quite confused Resp: no wheezing or increased work of breathing GI: nondistended.  Mild to moderate right-sided tenderness. Neurologic: Mental status: Oriented to person only. Confused as to place and situation and somewhat paranoid. Incision/Wound: Clean and dry without evidence of infection.  Lab Results:   Recent Labs  01/19/14 0420 01/20/14 0700  WBC 25.3* 12.4*  HGB 13.9 13.6  HCT 41.3 38.9*  PLT 190 162   BMET  Recent Labs  01/19/14 0420 01/20/14 0700  NA 140  --   K 4.7 4.2  CL 105  --   CO2 19  --   GLUCOSE 126*  --   BUN 13  --   CREATININE 1.07 1.07  CALCIUM 8.8  --      Studies/Results: No results found.  Anti-infectives: Anti-infectives   Start     Dose/Rate Route Frequency Ordered Stop   01/18/14 2000  cefoTEtan (CEFOTAN) 2 g in dextrose 5 % 50 mL IVPB     2 g 100 mL/hr over 30 Minutes Intravenous Every 12 hours 01/18/14 1141 01/18/14 2031   01/18/14 0600  clindamycin (CLEOCIN) 900 mg, gentamicin (GARAMYCIN) 240 mg in sodium chloride 0.9 % 1,000 mL for intraperitoneal lavage  Status:  Discontinued    Comments:  Pharmacy may adjust dosing strength, schedule, rate of  infusion, etc as needed to optimize therapy    Intraperitoneal To Surgery 01/17/14 1442 01/18/14 1128   01/18/14 0559  cefoTEtan (CEFOTAN) 2 g in dextrose 5 % 50 mL IVPB     2 g 100 mL/hr over 30 Minutes Intravenous On call to O.R. 01/18/14 0559 01/18/14 0743      Assessment/Plan: s/p Procedure(s): LAPAROSCOPIC PARTIAL RIGHT COLECTOMY No sign of specific surgical complication. Abdomen is somewhat tender but bowel function returning and white blood count decreased. Postop delirium. The family is very concerned about his mental status. They deny that he has had any alcohol in the last 5 years. 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 We will try to minimize narcotics and sedatives. The family will be with the patient and encouraged to stay awake and ambulate as much as possible during the day. Repeat labs in a.m.    LOS: 2 days    Keyshla Tunison T 01/20/2014

## 2014-01-20 NOTE — Progress Notes (Signed)
Pharmacy Brief Note - Alvimopan (Entereg)  The standing order set for alvimopan (Entereg) now includes an automatic order to discontinue the drug after the patient has had a bowel movement.  The change was approved by the [place] and the Medical Executive Committee.    This patient has had a bowel movement documented by nursing.  Therefore, alvimopan has been discontinued.  If there are questions, please contact the pharmacy at [phone].  Thank you- Dolly Rias RPh 01/20/2014, 2:14 PM Pager [phone]

## 2014-01-21 LAB — BASIC METABOLIC PANEL
ANION GAP: 7 (ref 5–15)
BUN: 8 mg/dL (ref 6–23)
CHLORIDE: 107 meq/L (ref 96–112)
CO2: 28 meq/L (ref 19–32)
Calcium: 8.8 mg/dL (ref 8.4–10.5)
Creatinine, Ser: 1.01 mg/dL (ref 0.50–1.35)
GFR calc Af Amer: 84 mL/min — ABNORMAL LOW (ref >90)
GFR calc non Af Amer: 72 mL/min — ABNORMAL LOW (ref >90)
GLUCOSE: 102 mg/dL — AB (ref 70–99)
POTASSIUM: 4 meq/L (ref 3.7–5.3)
SODIUM: 142 meq/L (ref 137–147)

## 2014-01-21 LAB — CBC
HCT: 35.7 % — ABNORMAL LOW (ref 39.0–52.0)
HEMOGLOBIN: 12.2 g/dL — AB (ref 13.0–17.0)
MCH: 31 pg (ref 26.0–34.0)
MCHC: 34.2 g/dL (ref 30.0–36.0)
MCV: 90.6 fL (ref 78.0–100.0)
PLATELETS: 155 10*3/uL (ref 150–400)
RBC: 3.94 MIL/uL — AB (ref 4.22–5.81)
RDW: 13.2 % (ref 11.5–15.5)
WBC: 9.5 10*3/uL (ref 4.0–10.5)

## 2014-01-21 NOTE — Progress Notes (Signed)
Discharge instructions explained to patient and his daughter; pt's daughter and pt vocalized understanding discharge instructions; pt given a copy of discharge instructions.

## 2014-01-21 NOTE — Progress Notes (Signed)
Patient ID: [name], male   DOB: 08/06/1940, 73 y.o.   MRN: [id number] 3 Days Post-Op  Subjective: Doing much better today. Has had several bowel movements. No nausea and tolerating diet. His confusion has cleared and his daughter is very pleased about this.  Objective: Vital signs in last 24 hours: Temp:  [97.7 F (36.5 C)-98.3 F (36.8 C)] 98.3 F (36.8 C) (08/16 0536) Pulse Rate:  [76-87] 76 (08/16 0536) Resp:  [16-18] 16 (08/16 0536) BP: (121-157)/(68-79) 121/68 mmHg (08/16 0536) SpO2:  [96 %-98 %] 97 % (08/16 0804) Weight:  [154 lb 8.7 oz (70.1 kg)] 154 lb 8.7 oz (70.1 kg) (08/15 1544) Last BM Date: 01/20/14  Intake/Output from previous day: 08/15 0701 - 08/16 0700 In: 1980 [P.O.:180; I.V.:1800] Out: 100 [Urine:100] Intake/Output this shift:    General appearance: alert, cooperative and no distress GI: normal findings: soft, non-tender and nondistended Neurologic: Mental status: Alert, oriented, thought content appropriate Incision/Wound: clean and dry without evidence of infection  Lab Results:   Recent Labs  01/20/14 0700 01/21/14 0548  WBC 12.4* 9.5  HGB 13.6 12.2*  HCT 38.9* 35.7*  PLT 162 155   BMET  Recent Labs  01/19/14 0420 01/20/14 0700 01/21/14 0548  NA 140  --  142  K 4.7 4.2 4.0  CL 105  --  107  CO2 19  --  28  GLUCOSE 126*  --  102*  BUN 13  --  8  CREATININE 1.07 1.07 1.01  CALCIUM 8.8  --  8.8     Studies/Results: No results found.  Anti-infectives: Anti-infectives   Start     Dose/Rate Route Frequency Ordered Stop   01/18/14 2000  cefoTEtan (CEFOTAN) 2 g in dextrose 5 % 50 mL IVPB     2 g 100 mL/hr over 30 Minutes Intravenous Every 12 hours 01/18/14 1141 01/18/14 2031   01/18/14 0600  clindamycin (CLEOCIN) 900 mg, gentamicin (GARAMYCIN) 240 mg in sodium chloride 0.9 % 1,000 mL for intraperitoneal lavage  Status:  Discontinued    Comments:  Pharmacy may adjust dosing strength, schedule, rate of infusion, etc as needed to  optimize therapy    Intraperitoneal To Surgery 01/17/14 1442 01/18/14 1128   01/18/14 0559  cefoTEtan (CEFOTAN) 2 g in dextrose 5 % 50 mL IVPB     2 g 100 mL/hr over 30 Minutes Intravenous On call to O.R. 01/18/14 0559 01/18/14 0743      Assessment/Plan: s/p Procedure(s): LAPAROSCOPIC PARTIAL RIGHT COLECTOMY Doing very well without apparent complication. Mental status has cleared back to baseline and his daughter is very pleased about this. Okay for discharge.   LOS: 3 days    Jasan Doughtie T 01/21/2014

## 2014-01-22 ENCOUNTER — Telehealth (INDEPENDENT_AMBULATORY_CARE_PROVIDER_SITE_OTHER): Payer: Self-pay

## 2014-01-22 NOTE — Telephone Encounter (Signed)
Message copied by Illene Regulus on Mon Jan 22, 2014  2:17 PM ------      Message from: Adin Hector      Created: Mon Jan 22, 2014  9:33 AM       S/p proximal "right" colectomy last week.  Pathology shows benign results: Adenoma.  NO CANCER              Sora Vrooman, CCS MA, please call on the patient to make sure recovery is going well & tell pt the good news on the pathology.  Thanks,            Adin Hector, M.D., F.A.C.S.      Gastrointestinal and Minimally Invasive Surgery      [facility]      [address], [place]      [place], [place] [address]      [phone] Main / Paging             ------

## 2014-01-22 NOTE — Telephone Encounter (Signed)
Called pt to notify him of the pathology report to show benign results of adenoma no cancer per Dr Johney Maine. F/u appt made for 9/2.

## 2014-01-23 NOTE — Discharge Summary (Signed)
Physician Discharge Summary  Patient ID: [name] MRN: [id number] DOB/AGE: 09/28/1940 73 y.o.  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  Postoperatively, the patient gradually mobilized in the hallways and advanced to a solid diet.  Pain and other symptoms  were treated aggressively.  He has some conflicting history of heavy alcohol intake.  Had some issues of confusion.  Placed on alcohol withdrawal protocol.  Improved by day of discharge.    By the time of discharge, the patient was walking well the hallways, eating food well, having flatus.  Pain was well-controlled on an oral regimen.  Based on meeting discharge criteria and continuing to recover, It was felt it was safe for the patient to be discharged from the hospital with close followup.  Instructions were discussed in detail.  They are written as well.     Consults: None  Significant Diagnostic Studies:   Recent Results (from the past 2160 hour(s))  HEMOGLOBIN A1C     Status: Abnormal   Collection Time    01/11/14  2:50 PM      Result Value Ref Range   Hemoglobin A1C 5.8 (*) <5.7 %   Comment: (NOTE)                                                                                According to the ADA Clinical Practice Recommendations for 2011, when     HbA1c is used as a screening test:      >=6.5%   Diagnostic of Diabetes Mellitus               (if abnormal result is confirmed)     5.7-6.4%   Increased risk of developing Diabetes Mellitus     References:Diagnosis and Classification of Diabetes Mellitus,Diabetes     LFYB,0175,10(CHENI 1):S62-S69 and Standards of Medical Care in             Diabetes - 2011,Diabetes Care,2011,34 (Suppl 1):S11-S61.   Mean Plasma Glucose 120 (*) <117 mg/dL   Comment: Performed at [facility]     Status: Abnormal   Collection Time    01/11/14  2:50 PM      Result Value Ref Range   Sodium 140  137 - 147 mEq/L   Potassium 4.3  3.7 - 5.3 mEq/L   Chloride 104  96 - 112 mEq/L   CO2 27  19 - 32 mEq/L   Glucose, Bld 103 (*) 70 - 99 mg/dL  BUN 16  6 - 23 mg/dL   Creatinine, Ser 1.05  0.50 - 1.35 mg/dL   Calcium 9.6  8.4 - 10.5 mg/dL   GFR calc non Af Amer 69 (*) >90 mL/min   GFR calc Af Amer 80 (*) >90 mL/min   Comment: (NOTE)     The eGFR has been calculated using the CKD EPI equation.     This calculation has not been validated in all clinical situations.     eGFR's persistently <90 mL/min signify possible Chronic Kidney     Disease.   Anion gap 9  5 - 15  CBC     Status: None   Collection Time    01/11/14  2:50 PM      Result Value Ref Range   WBC 8.4  4.0 - 10.5 K/uL   RBC 5.26  4.22 - 5.81 MIL/uL   Hemoglobin 16.5  13.0 - 17.0 g/dL   HCT 48.4  39.0 - 52.0 %   MCV 92.0  78.0 - 100.0 fL   MCH 31.4  26.0 - 34.0 pg   MCHC 34.1  30.0 - 36.0 g/dL   RDW 13.0  11.5 - 15.5 %   Platelets 168  150 - 400 K/uL  TYPE AND SCREEN     Status: None   Collection Time    01/11/14  2:50 PM      Result Value Ref Range   ABO/RH(D) A POS     Antibody Screen NEG     Sample Expiration 01/21/2014    ABO/RH     Status: None   Collection Time    01/11/14  2:50 PM      Result Value Ref Range   ABO/RH(D) A  POS    BASIC METABOLIC PANEL     Status: Abnormal   Collection Time    01/19/14  4:20 AM      Result Value Ref Range   Sodium 140  137 - 147 mEq/L   Potassium 4.7  3.7 - 5.3 mEq/L   Chloride 105  96 - 112 mEq/L   CO2 19  19 - 32 mEq/L   Glucose, Bld 126 (*) 70 - 99 mg/dL   BUN 13  6 - 23 mg/dL   Creatinine, Ser 1.07  0.50 - 1.35 mg/dL   Calcium 8.8  8.4 - 10.5 mg/dL   GFR calc non Af Amer 67 (*) >90 mL/min   GFR calc Af Amer 78 (*) >90 mL/min   Comment: (NOTE)     The eGFR has been calculated using the CKD EPI equation.     This calculation has not been validated in all clinical situations.     eGFR's persistently <90 mL/min signify possible Chronic Kidney     Disease.   Anion gap 16 (*) 5 - 15  CBC     Status: Abnormal   Collection Time    01/19/14  4:20 AM      Result Value Ref Range   WBC 25.3 (*) 4.0 - 10.5 K/uL   RBC 4.46  4.22 - 5.81 MIL/uL   Hemoglobin 13.9  13.0 - 17.0 g/dL   HCT 41.3  39.0 - 52.0 %   MCV 92.6  78.0 - 100.0 fL   MCH 31.2  26.0 - 34.0 pg   MCHC 33.7  30.0 - 36.0 g/dL   RDW 13.3  11.5 - 15.5 %   Platelets 190  150 - 400 K/uL  MAGNESIUM     Status:  None   Collection Time    01/19/14  4:20 AM      Result Value Ref Range   Magnesium 1.7  1.5 - 2.5 mg/dL  CBC     Status: Abnormal   Collection Time    01/20/14  7:00 AM      Result Value Ref Range   WBC 12.4 (*) 4.0 - 10.5 K/uL   RBC 4.35  4.22 - 5.81 MIL/uL   Hemoglobin 13.6  13.0 - 17.0 g/dL   HCT 38.9 (*) 39.0 - 52.0 %   MCV 89.4  78.0 - 100.0 fL   MCH 31.3  26.0 - 34.0 pg   MCHC 35.0  30.0 - 36.0 g/dL   RDW 13.2  11.5 - 15.5 %   Platelets 162  150 - 400 K/uL  POTASSIUM     Status: None   Collection Time    01/20/14  7:00 AM      Result Value Ref Range   Potassium 4.2  3.7 - 5.3 mEq/L  CREATININE, SERUM     Status: Abnormal   Collection Time    01/20/14  7:00 AM      Result Value Ref Range   Creatinine, Ser 1.07  0.50 - 1.35 mg/dL   GFR calc non Af Amer 67 (*) >90 mL/min   GFR calc Af  Amer 78 (*) >90 mL/min   Comment: (NOTE)     The eGFR has been calculated using the CKD EPI equation.     This calculation has not been validated in all clinical situations.     eGFR's persistently <90 mL/min signify possible Chronic Kidney     Disease.  CBC     Status: Abnormal   Collection Time    01/21/14  5:48 AM      Result Value Ref Range   WBC 9.5  4.0 - 10.5 K/uL   RBC 3.94 (*) 4.22 - 5.81 MIL/uL   Hemoglobin 12.2 (*) 13.0 - 17.0 g/dL   HCT 35.7 (*) 39.0 - 52.0 %   MCV 90.6  78.0 - 100.0 fL   MCH 31.0  26.0 - 34.0 pg   MCHC 34.2  30.0 - 36.0 g/dL   RDW 13.2  11.5 - 15.5 %   Platelets 155  150 - 400 K/uL  BASIC METABOLIC PANEL     Status: Abnormal   Collection Time    01/21/14  5:48 AM      Result Value Ref Range   Sodium 142  137 - 147 mEq/L   Potassium 4.0  3.7 - 5.3 mEq/L   Chloride 107  96 - 112 mEq/L   CO2 28  19 - 32 mEq/L   Glucose, Bld 102 (*) 70 - 99 mg/dL   BUN 8  6 - 23 mg/dL   Creatinine, Ser 1.01  0.50 - 1.35 mg/dL   Calcium 8.8  8.4 - 10.5 mg/dL   GFR calc non Af Amer 72 (*) >90 mL/min   GFR calc Af Amer 84 (*) >90 mL/min   Comment: (NOTE)     The eGFR has been calculated using the CKD EPI equation.     This calculation has not been validated in all clinical situations.     eGFR's persistently <90 mL/min signify possible Chronic Kidney     Disease.   Anion gap 7  5 - 15   Discharge Exam: Blood pressure 121/68, pulse 76, temperature 98.3 F (36.8 C), temperature source Oral, resp. rate 16, height 5'  7.2" (1.707 m), weight 154 lb 8.7 oz (70.1 kg), SpO2 97.00%.  General: Pt awake/alert/oriented x4 in no acute distress  Eyes: PERRL, normal EOM. Sclera clear. No icterus  Neuro: CN II-XII intact w/o focal sensory/motor deficits.  Lymph: No head/neck/groin lymphadenopathy  Psych: No delerium/psychosis/paranoia. Mildly confused  HENT: Normocephalic, Mucus membranes moist. No thrush  Neck: Supple, No tracheal deviation  Chest: No chest wall pain w good  excursion  CV: Pulses intact. Regular rhythm  MS: Normal AROM mjr joints. No obvious deformity  Abdomen: Soft. Nondistended. Mildly tender at incisions only. No evidence of peritonitis. No incarcerated hernias.  Ext: SCDs BLE. No mjr edema. No cyanosis  Skin: No petechiae / purpura   Disposition: 01-Home or Self Care  Discharge Instructions   Call MD for:  extreme fatigue    Complete by:  As directed      Call MD for:  hives    Complete by:  As directed      Call MD for:  persistant nausea and vomiting    Complete by:  As directed      Call MD for:  redness, tenderness, or signs of infection (pain, swelling, redness, odor or green/yellow discharge around incision site)    Complete by:  As directed      Call MD for:  severe uncontrolled pain    Complete by:  As directed      Call MD for:    Complete by:  As directed   Temperature > 101.61F     Diet - low sodium heart healthy    Complete by:  As directed      Discharge instructions    Complete by:  As directed   Please see discharge instruction sheets.  Also refer to handout given an office.  Please call our office if you have any questions or concerns [phone]     Discharge patient    Complete by:  As directed      Discharge wound care:    Complete by:  As directed   If you have closed incisions, shower and bathe over these incisions with soap and water every day.  Remove all surgical dressings on postoperative day #3.  You do not need to replace dressings over the closed incisions unless you feel more comfortable with a Band-Aid covering it.   If you have an open wound that requires packing, please see wound care instructions.  In general, remove all dressings, wash wound with soap and water and then replace with saline moistened gauze.  Do the dressing change at least every day.  Please call our office [phone] if you have further questions.     Driving Restrictions    Complete by:  As directed   No driving until off  narcotics and can safely swerve away without pain during an emergency     Increase activity slowly    Complete by:  As directed   Walk an hour a day.  Use 20-30 minute walks.  When you can walk 30 minutes without difficulty, increase to low impact/moderate activities such as biking, jogging, swimming, sexual activity..  Eventually can increase to unrestricted activity when not feeling pain.  If you feel pain: STOP![name]   Let pain protect you from overdoing it.  Use ice/heat/over-the-counter pain medications to help minimize his soreness.  Use pain prescriptions as needed to remain active.  It is better to take extra pain medications and be more active than to stay bedridden to avoid  all pain medications.     Lifting restrictions    Complete by:  As directed   Avoid heavy lifting initially.  Do not push through pain.  You have no specific weight limit.  Coughing and sneezing or four more stressful to your incision than any lifting you will do. Pain will protect you from injury.  Therefore, avoid intense activity until off all narcotic pain medications.  Coughing and sneezing or four more stressful to your incision than any lifting he will do.     May shower / Bathe    Complete by:  As directed      May walk up steps    Complete by:  As directed      Sexual Activity Restrictions    Complete by:  As directed   Sexual activity as tolerated.  Do not push through pain.  Pain will protect you from injury.     Walk with assistance    Complete by:  As directed   Walk over an hour a day.  May use a walker/cane/companion to help with balance and stamina.            Medication List    STOP taking these medications       metroNIDAZOLE 500 MG tablet  Commonly known as:  FLAGYL     naproxen sodium 220 MG tablet  Commonly known as:  ANAPROX     neomycin 500 MG tablet  Commonly known as:  MYCIFRADIN      TAKE these medications       amitriptyline 25 MG tablet  Commonly known as:  ELAVIL  Take 1  tablet (25 mg total) by mouth at bedtime.     aspirin 81 MG tablet  Take 81 mg by mouth every evening.     atorvastatin 40 MG tablet  Commonly known as:  LIPITOR  Take 40 mg by mouth at bedtime.     budesonide-formoterol 160-4.5 MCG/ACT inhaler  Commonly known as:  SYMBICORT  Inhale 2 puffs into the lungs 2 (two) times daily. 01/11/14- states uses PRN     chlorhexidine 0.12 % solution  Commonly known as:  PERIDEX  Use as directed 15 mLs in the mouth or throat 4 (four) times daily as needed (sores in mouth).     Fish Oil 1200 MG Caps  Take 1,200 mg by mouth daily.     oxyCODONE 5 MG immediate release tablet  Commonly known as:  Oxy IR/ROXICODONE  Take 1-2 tablets (5-10 mg total) by mouth every 4 (four) hours as needed for moderate pain, severe pain or breakthrough pain.     SYSTANE 0.4-0.3 % Soln  Generic drug:  Polyethyl Glycol-Propyl Glycol  Place 1 drop into both eyes 2 (two) times daily.           Follow-up Information   Follow up with Raylynne Cubbage C., MD In 2 weeks. (To follow up after your operation, To follow up after your hospital stay)    Specialty:  General Surgery   Contact information:   [place] [address] [phone]       Follow up with Adin Hector., MD. Schedule an appointment as soon as possible for a visit in 2 weeks.   Specialty:  General Surgery   Contact information:   [address] [place] [address] [phone]       Signed: Adin Hector. 01/23/2014, 5:24 PM

## 2014-02-07 ENCOUNTER — Encounter (INDEPENDENT_AMBULATORY_CARE_PROVIDER_SITE_OTHER): Payer: Medicare Other | Admitting: Surgery

## 2014-02-09 ENCOUNTER — Encounter (INDEPENDENT_AMBULATORY_CARE_PROVIDER_SITE_OTHER): Payer: Self-pay | Admitting: Surgery

## 2014-02-14 ENCOUNTER — Encounter: Payer: Self-pay | Admitting: Internal Medicine

## 2014-02-18 ENCOUNTER — Other Ambulatory Visit: Payer: Self-pay | Admitting: Internal Medicine

## 2014-04-12 ENCOUNTER — Ambulatory Visit (INDEPENDENT_AMBULATORY_CARE_PROVIDER_SITE_OTHER): Payer: Medicare Other | Admitting: *Deleted

## 2014-04-12 ENCOUNTER — Encounter: Payer: Self-pay | Admitting: Internal Medicine

## 2014-04-12 ENCOUNTER — Ambulatory Visit (INDEPENDENT_AMBULATORY_CARE_PROVIDER_SITE_OTHER): Payer: Medicare Other | Admitting: Internal Medicine

## 2014-04-12 VITALS — BP 128/66 | HR 80 | Temp 97.8°F | Ht 66.0 in | Wt 141.2 lb

## 2014-04-12 DIAGNOSIS — J449 Chronic obstructive pulmonary disease, unspecified: Secondary | ICD-10-CM

## 2014-04-12 DIAGNOSIS — Z23 Encounter for immunization: Secondary | ICD-10-CM

## 2014-04-12 DIAGNOSIS — F5101 Primary insomnia: Secondary | ICD-10-CM

## 2014-04-12 DIAGNOSIS — R399 Unspecified symptoms and signs involving the genitourinary system: Secondary | ICD-10-CM | POA: Diagnosis not present

## 2014-04-12 DIAGNOSIS — E785 Hyperlipidemia, unspecified: Secondary | ICD-10-CM

## 2014-04-12 DIAGNOSIS — G3184 Mild cognitive impairment, so stated: Secondary | ICD-10-CM | POA: Insufficient documentation

## 2014-04-12 DIAGNOSIS — R413 Other amnesia: Secondary | ICD-10-CM | POA: Diagnosis not present

## 2014-04-12 LAB — COMPLETE METABOLIC PANEL WITH GFR
ALT: 16 U/L (ref 0–53)
AST: 15 U/L (ref 0–37)
Albumin: 3.8 g/dL (ref 3.5–5.2)
Alkaline Phosphatase: 55 U/L (ref 39–117)
BILIRUBIN TOTAL: 0.4 mg/dL (ref 0.2–1.2)
BUN: 20 mg/dL (ref 6–23)
CHLORIDE: 103 meq/L (ref 96–112)
CO2: 28 meq/L (ref 19–32)
Calcium: 9.4 mg/dL (ref 8.4–10.5)
Creat: 1.13 mg/dL (ref 0.50–1.35)
GFR, EST AFRICAN AMERICAN: 75 mL/min
GFR, Est Non African American: 65 mL/min
Glucose, Bld: 81 mg/dL (ref 70–99)
Potassium: 4.6 mEq/L (ref 3.5–5.3)
Sodium: 139 mEq/L (ref 135–145)
TOTAL PROTEIN: 6.5 g/dL (ref 6.0–8.3)

## 2014-04-12 LAB — TSH: TSH: 1.574 u[IU]/mL (ref 0.350–4.500)

## 2014-04-12 LAB — VITAMIN B12: Vitamin B-12: 550 pg/mL (ref 211–911)

## 2014-04-12 MED ORDER — TAMSULOSIN HCL 0.4 MG PO CAPS: 0.4000 mg | ORAL_CAPSULE | Freq: Every day | ORAL | Status: DC

## 2014-04-12 MED ORDER — PANTOPRAZOLE SODIUM 40 MG PO TBEC: 40.0000 mg | DELAYED_RELEASE_TABLET | Freq: Every day | ORAL | Status: DC

## 2014-04-12 NOTE — Progress Notes (Signed)
[facility]  Subjective:   Patient ID: [name] male   DOB: 24-Mar-1941 73 y.o.   MRN: [id number]  HPI: [name] is a 73 y.o. male with PMH of COPD, HLD, GERD, Insomnia, and Colon Adenoma (s/p recent surgical resection).  He presets today for routine follow up   His chief complaints today is that he feels his memory is getting worse.  He feels this has been a gradual process over the past few months.  He has not had any changes in medication.  He is concerned that it could however be related to atorvastatin and stopped taking this a few weeks ago but still has had no improvement.  He denies any weakness, numbness, or other neurological complaints.  He denies any increased fatigue or depression.  He does not drink alcohol.   He reports his insomnia is doing much better lately.  He is still taking amitriptyline most nights. He is falling asleep around 12:30-1am.    He is still using Symbicort 1-2 times a day and has no complaints of SOB.  He is interested in the pneumococal vaccine    Past Medical History  Diagnosis Date  . Hyperlipidemia   . 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 GERD (gastroesophageal reflux disease)   . Colonic polyp     Tubulovillous adenomatous  . Tobacco abuse     quit 2001  . Gout 12/2007    inadequate aspiration of MTP joint left great toe  06/18/08; -fluid:  red, bloody, cell count not performed (TNP) and no crystals or organisms seen.  [name] COPD (chronic obstructive pulmonary disease)   . Insomnia   . PERFORATION, INTESTINE 06/19/2006    Annotation: During colonoscopy Qualifier: History of  By: Oretha Ellis    . Delirium, acute 2015    After anesthesia for colectomy - resolved in 48hrs   Current Outpatient Prescriptions  Medication Sig Dispense Refill  . amitriptyline (ELAVIL) 25 MG tablet Take 1 tablet (25 mg total) by mouth at bedtime as needed for sleep. 30 tablet 2  . aspirin 81 MG tablet Take 81 mg by mouth every evening.     [name]  atorvastatin (LIPITOR) 40 MG tablet Take 40 mg by mouth at bedtime.    . budesonide-formoterol (SYMBICORT) 160-4.5 MCG/ACT inhaler Inhale 2 puffs into the lungs 2 (two) times daily. 01/11/14- states uses PRN 3 Inhaler 1  . chlorhexidine (PERIDEX) 0.12 % solution Use as directed 15 mLs in the mouth or throat 4 (four) times daily as needed (sores in mouth).    . Omega-3 Fatty Acids (FISH OIL) 1200 MG CAPS Take 1,200 mg by mouth daily.     [name] oxyCODONE (OXY IR/ROXICODONE) 5 MG immediate release tablet Take 1-2 tablets (5-10 mg total) by mouth every 4 (four) hours as needed for moderate pain, severe pain or breakthrough pain. 40 tablet 0  . Polyethyl Glycol-Propyl Glycol (SYSTANE) 0.4-0.3 % SOLN Place 1 drop into both eyes 2 (two) times daily.     No current facility-administered medications for this visit.   Family History  Problem Relation Age of Onset  . Colon cancer Neg Hx   . Pancreatic cancer Neg Hx   . Stomach cancer Neg Hx   . Rectal cancer Neg Hx    History   Social History  . Marital Status: Widowed    Spouse Name: N/A    Number of Children: 1  . Years of Education: N/A   Occupational History  . Retired  Social History Main Topics  . Smoking status: Former Smoker    Types: Cigarettes    Quit date: 08/27/2000  . Smokeless tobacco: Never Used  . Alcohol Use: No     Comment: none x 7 years  . Drug Use: No  . Sexual Activity: None   Other Topics Concern  . None   Social History Narrative   Review of Systems: Review of Systems  Constitutional: Negative for fever, chills and weight loss.  HENT: Negative for congestion and sore throat.   Eyes: Negative for blurred vision.  Respiratory: Negative for cough and shortness of breath.   Cardiovascular: Negative for chest pain, palpitations and leg swelling.  Gastrointestinal: Positive for constipation. Negative for heartburn, nausea, vomiting, abdominal pain and diarrhea.  Genitourinary: Negative for dysuria and frequency.   Musculoskeletal: Negative for myalgias.  Skin: Negative for rash.  Neurological: Negative for dizziness, tingling, weakness and headaches.  Psychiatric/Behavioral: Negative for depression. The patient has insomnia.     Objective:  Physical Exam: Filed Vitals:   04/12/14 1324  BP: 128/66  Pulse: 80  Temp: 97.8 F (36.6 C)  TempSrc: Oral  Height: 5\' 6"  (1.676 m)  Weight: 141 lb 3.4 oz (64.052 kg)  SpO2: 99%   Physical Exam  Constitutional: He is oriented to person, place, and time and well-developed, well-nourished, and in no distress. No distress.  HENT:  Head: Normocephalic and atraumatic.  Eyes: Conjunctivae are normal.  Cardiovascular: Normal rate, regular rhythm and normal heart sounds.   No murmur heard. Pulmonary/Chest: Effort normal and breath sounds normal. No respiratory distress. He has no wheezes. He has no rales.  Abdominal: Soft. Bowel sounds are normal. He exhibits no distension. There is no tenderness.  Musculoskeletal: He exhibits no edema.  Neurological: He is alert and oriented to person, place, and time.  Skin: Skin is warm and dry. He is not diaphoretic.  Psychiatric: Mood, affect and judgment normal.  MOCA 26, with decrease in serial 7 and delayed recall  Nursing note and vitals reviewed.   Assessment & Plan:   See problem based Assessment and Plan Case discussed with Dr. Daryll Drown

## 2014-04-12 NOTE — Patient Instructions (Signed)
General Instructions: I will call with the results of the lab tests.  Please bring your medicines with you each time you come to clinic.  Medicines may include prescription medications, over-the-counter medications, herbal remedies, eye drops, vitamins, or other pills.   Progress Toward Treatment Goals:  No flowsheet data found.  Self Care Goals & Plans:  No flowsheet data found.  No flowsheet data found.   Care Management & Community Referrals:  No flowsheet data found.    Tamsulosin capsules What is this medicine? TAMSULOSIN (tam SOO loe sin) is used to treat enlargement of the prostate gland in men, a condition called benign prostatic hyperplasia or BPH. It is not for use in women. It works by relaxing muscles in the prostate and bladder neck. This improves urine flow and reduces BPH symptoms. This medicine may be used for other purposes; ask your health care provider or pharmacist if you have questions. COMMON BRAND NAME(S): Flomax What should I tell my health care provider before I take this medicine? They need to know if you have any of the following conditions: -advanced kidney disease -advanced liver disease -low blood pressure -prostate cancer -an unusual or allergic reaction to tamsulosin, sulfa drugs, other medicines, foods, dyes, or preservatives -pregnant or trying to get pregnant -breast-feeding How should I use this medicine? Take this medicine by mouth about 30 minutes after the same meal every day. Follow the directions on the prescription label. Swallow the capsules whole with a glass of water. Do not crush, chew, or open capsules. Do not take your medicine more often than directed. Do not stop taking your medicine unless your doctor tells you to. Talk to your pediatrician regarding the use of this medicine in children. Special care may be needed. Overdosage: If you think you have taken too much of this medicine contact a poison control center or emergency  room at once. NOTE: This medicine is only for you. Do not share this medicine with others. What if I miss a dose? If you miss a dose, take it as soon as you can. If it is almost time for your next dose, take only that dose. Do not take double or extra doses. If you stop taking your medicine for several days or more, ask your doctor or health care professional what dose you should start back on. What may interact with this medicine? -cimetidine -fluoxetine -ketoconazole -medicines for erectile disfunction like sildenafil, tadalafil, vardenafil -medicines for high blood pressure -other alpha-blockers like alfuzosin, doxazosin, phentolamine, phenoxybenzamine, prazosin, terazosin -warfarin This list may not describe all possible interactions. Give your health care provider a list of all the medicines, herbs, non-prescription drugs, or dietary supplements you use. Also tell them if you smoke, drink alcohol, or use illegal drugs. Some items may interact with your medicine. What should I watch for while using this medicine? Visit your doctor or health care professional for regular check ups. You will need lab work done before you start this medicine and regularly while you are taking it. Check your blood pressure as directed. Ask your health care professional what your blood pressure should be, and when you should contact him or her. This medicine may make you feel dizzy or lightheaded. This is more likely to happen after the first dose, after an increase in dose, or during hot weather or exercise. Drinking alcohol and taking some medicines can make this worse. Do not drive, use machinery, or do anything that needs mental alertness until you know how this medicine  affects you. Do not sit or stand up quickly. If you begin to feel dizzy, sit down until you feel better. These effects can decrease once your body adjusts to the medicine. Contact your doctor or health care professional right away if you have an  erection that lasts longer than 4 hours or if it becomes painful. This may be a sign of a serious problem and must be treated right away to prevent permanent damage. If you are thinking of having cataract surgery, tell your eye surgeon that you have taken this medicine. What side effects may I notice from receiving this medicine? Side effects that you should report to your doctor or health care professional as soon as possible: -allergic reactions like skin rash or itching, hives, swelling of the lips, mouth, tongue, or throat -breathing problems -change in vision -feeling faint or lightheaded -irregular heartbeat -prolonged or painful erection -weakness Side effects that usually do not require medical attention (report to your doctor or health care professional if they continue or are bothersome): -back pain -change in sex drive or performance -constipation, nausea or vomiting -cough -drowsy -runny or stuffy nose -trouble sleeping This list may not describe all possible side effects. Call your doctor for medical advice about side effects. You may report side effects to FDA at 1-800-FDA-1088. Where should I keep my medicine? Keep out of the reach of children. Store at room temperature between 15 and 30 degrees C (59 and 86 degrees F). Throw away any unused medicine after the expiration date. NOTE: This sheet is a summary. It may not cover all possible information. If you have questions about this medicine, talk to your doctor, pharmacist, or health care provider.  2015, [employer]vier/Gold Standard. (2012-05-25 14:11:34)

## 2014-04-13 ENCOUNTER — Encounter: Payer: Self-pay | Admitting: Internal Medicine

## 2014-04-13 LAB — RPR

## 2014-04-13 LAB — HIV ANTIBODY (ROUTINE TESTING W REFLEX): HIV 1&2 Ab, 4th Generation: NONREACTIVE

## 2014-04-15 NOTE — Assessment & Plan Note (Signed)
Patient has stopped taking his atorvastatin.  I recommended that he resume it as it is unlikely to cause his memory loss and is further evidence that his symptoms have not improved off the medication.  We agreed that he can remain off it a little while longer while working up his memory loss but I would like him to resume this on his next visit.

## 2014-04-15 NOTE — Assessment & Plan Note (Signed)
Stable.  Continue symbicort

## 2014-04-15 NOTE — Assessment & Plan Note (Signed)
Subjective memory loss over past few months.  Mildly decreased MOCA to 26.  Will check CMP, B12, TSH, HIV, RPR for reversible causes.  Will have back in 1-2 months for reassessment and consider starting medication versus referral to neurology. -Labs returned wnl.

## 2014-04-15 NOTE — Assessment & Plan Note (Signed)
Currently improved.  Going to bed around midnight lately. Continue amitriptyline for now will try to eventually wean off.

## 2014-04-15 NOTE — Assessment & Plan Note (Signed)
Patient reports nocturia 2-3 times a night, terminal dribble, he denies any dysuria.  We discussed the natural history of prostate cancer and ongoing controversy regarding screening and potential treatment outcomes of prostate cancer has been discussed with the patient. The meaning of a false positive PSA and a false negative PSA has been discussed. He indicates understanding of the limitations of this screening test and wishes NOT to proceed with screening PSA testing.  I did recommend a DRE to assess his prostate and he declined today.  I recommended that we do complete a DRE on our next visit.  -Weill start flomax daily.  Reassess in 1-2 months.  Warned [name] of potential side effects including orthostatic hypotension.

## 2014-04-18 NOTE — Progress Notes (Signed)
[facility] Attending  Case discussed with Dr. Hoffman soon after the resident saw the patient.  We reviewed the resident's history and exam and pertinent patient test results.  I agree with the assessment, diagnosis, and plan of care documented in the resident's note. 

## 2014-05-12 ENCOUNTER — Other Ambulatory Visit: Payer: Self-pay | Admitting: Internal Medicine

## 2014-06-12 ENCOUNTER — Encounter: Payer: Self-pay | Admitting: Internal Medicine

## 2014-06-13 ENCOUNTER — Encounter: Payer: Self-pay | Admitting: Internal Medicine

## 2014-06-13 ENCOUNTER — Ambulatory Visit (INDEPENDENT_AMBULATORY_CARE_PROVIDER_SITE_OTHER): Payer: Medicare Other | Admitting: Internal Medicine

## 2014-06-13 VITALS — BP 122/67 | HR 80 | Ht 66.0 in | Wt 142.2 lb

## 2014-06-13 DIAGNOSIS — G3184 Mild cognitive impairment, so stated: Secondary | ICD-10-CM | POA: Diagnosis not present

## 2014-06-13 DIAGNOSIS — R399 Unspecified symptoms and signs involving the genitourinary system: Secondary | ICD-10-CM

## 2014-06-13 NOTE — Assessment & Plan Note (Signed)
Greatly improved with flomax.  Patient wants to defer DRE to next visit.

## 2014-06-13 NOTE — Assessment & Plan Note (Signed)
Patient MOCA score decreased this visit to 24 from 26 at last visit.  His previous workup so far has been negative. I advised him that the next step should be an MRI and then possibly a referral to Neurology.  He reported if possible he would like to be referred to neurology first, which is reasonable. He does not that he has talked with his daugher about this and has made her his [facility] POA, he also plans to talk to her about his end of life decisions very soon.  Will we discuss this in more detail at his next visit.

## 2014-06-13 NOTE — Patient Instructions (Signed)
General Instructions: I have placed a referral to neurology.  We will contact you with your appointment date.  Please bring your medicines with you each time you come to clinic.  Medicines may include prescription medications, over-the-counter medications, herbal remedies, eye drops, vitamins, or other pills.   Progress Toward Treatment Goals:  No flowsheet data found.  Self Care Goals & Plans:  No flowsheet data found.  No flowsheet data found.   Care Management & Community Referrals:  No flowsheet data found.    Dementia Dementia is a general term for problems with brain function. A person with dementia has memory loss and a hard time with at least one other brain function such as thinking, speaking, or problem solving. Dementia can affect social functioning, how you do your job, your mood, or your personality. The changes may be hidden for a long time. The earliest forms of this disease are usually not detected by family or friends. Dementia can be:  Irreversible.  Potentially reversible.  Partially reversible.  Progressive. This means it can get worse over time. CAUSES  Irreversible dementia causes may include:  Degeneration of brain cells (Alzheimer disease or Lewy body dementia).  Multiple small strokes (vascular dementia).  Infection (chronic meningitis or Creutzfeldt-Jakob disease).  Frontotemporal dementia. This affects younger people, age 44 to 21, compared to those who have Alzheimer disease.  Dementia associated with other disorders like Parkinson disease, Huntington disease, or HIV-associated dementia. Potentially or partially reversible dementia causes may include:  Medicines.  Metabolic causes such as excessive alcohol intake, vitamin B12 deficiency, or thyroid disease.  Masses or pressure in the brain such as a tumor, blood clot, or hydrocephalus. SIGNS AND SYMPTOMS  Symptoms are often hard to detect. Family members or coworkers may not notice them  early in the disease process. Different people with dementia may have different symptoms. Symptoms can include:  A hard time with memory, especially recent memory. Long-term memory may not be impaired.  Asking the same question multiple times or forgetting something someone just said.  A hard time speaking your thoughts or finding certain words.  A hard time solving problems or performing familiar tasks (such as how to use a telephone).  Sudden changes in mood.  Changes in personality, especially increasing moodiness or mistrust.  Depression.  A hard time understanding complex ideas that were never a problem in the past. DIAGNOSIS  There are no specific tests for dementia.   Your health care provider may recommend a thorough evaluation. This is because some forms of dementia can be reversible. The evaluation will likely include a physical exam and getting a detailed history from you and a family member. The history often gives the best clues and suggestions for a diagnosis.  Memory testing may be done. A detailed brain function evaluation called neuropsychologic testing may be helpful.  Lab tests and brain imaging (such as a CT scan or MRI scan) are sometimes important.  Sometimes observation and re-evaluation over time is very helpful. TREATMENT  Treatment depends on the cause.   If the problem is a vitamin deficiency, it may be helped or cured with supplements.  For dementias such as Alzheimer disease, medicines are available to stabilize or slow the course of the disease. There are no cures for this type of dementia.  Your health care provider can help direct you to groups, organizations, and other health care providers to help with decisions in the care of you or your loved one. HOME CARE INSTRUCTIONS The care  of individuals with dementia is varied and dependent upon the progression of the dementia. The following suggestions are intended for the person living with, or caring  for, the person with dementia.  Create a safe environment.  Remove the locks on bathroom doors to prevent the person from accidentally locking himself or herself in.  Use childproof latches on kitchen cabinets and any place where cleaning supplies, chemicals, or alcohol are kept.  Use childproof covers in unused electrical outlets.  Install childproof devices to keep doors and windows secured.  Remove stove knobs or install safety knobs and an automatic shut-off on the stove.  Lower the temperature on water heaters.  Label medicines and keep them locked up.  Secure knives, lighters, matches, power tools, and guns, and keep these items out of reach.  Keep the house free from clutter. Remove rugs or anything that might contribute to a fall.  Remove objects that might break and hurt the person.  Make sure lighting is good, both inside and outside.  Install grab rails as needed.  Use a monitoring device to alert you to falls or other needs for help.  Reduce confusion.  Keep familiar objects and people around.  Use night lights or dim lights at night.  Label items or areas.  Use reminders, notes, or directions for daily activities or tasks.  Keep a simple, consistent routine for waking, meals, bathing, dressing, and bedtime.  Create a calm, quiet environment.  Place large clocks and calendars prominently.  Display emergency numbers and home address near all telephones.  Use cues to establish different times of the day. An example is to open curtains to let the natural light in during the day.   Use effective communication.  Choose simple words and short sentences.  Use a gentle, calm tone of voice.  Be careful not to interrupt.  If the person is struggling to find a word or communicate a thought, try to provide the word or thought.  Ask one question at a time. Allow the person ample time to answer questions. Repeat the question again if the person does not  respond.  Reduce nighttime restlessness.  Provide a comfortable bed.  Have a consistent nighttime routine.  Ensure a regular walking or physical activity schedule. Involve the person in daily activities as much as possible.  Limit napping during the day.  Limit caffeine.  Attend social events that stimulate rather than overwhelm the senses.  Encourage good nutrition and hydration.  Reduce distractions during meal times and snacks.  Avoid foods that are too hot or too cold.  Monitor chewing and swallowing ability.  Continue with routine vision, hearing, dental, and medical screenings.  Give medicines only as directed by the health care provider.  Monitor driving abilities. Do not allow the person to drive when safe driving is no longer possible.  Register with an identification program which could provide location assistance in the event of a missing person situation. SEEK MEDICAL CARE IF:   New behavioral problems start such as moodiness, aggressiveness, or seeing things that are not there (hallucinations).  Any new problem with brain function happens. This includes problems with balance, speech, or falling a lot.  Problems with swallowing develop.  Any symptoms of other illness happen. Small changes or worsening in any aspect of brain function can be a sign that the illness is getting worse. It can also be a sign of another medical illness such as infection. Seeing a health care provider right away is important.  SEEK IMMEDIATE MEDICAL CARE IF:   A fever develops.  New or worsened confusion develops.  New or worsened sleepiness develops.  Staying awake becomes hard to do. Document Released: 11/18/2000 Document Revised: 10/09/2013 Document Reviewed: 10/20/2010 [facility] Patient Information 2015 [place], [place]. This information is not intended to replace advice given to you by your health care provider. Make sure you discuss any questions you have with your health  care provider.

## 2014-06-13 NOTE — Progress Notes (Signed)
[facility] Subjective:   Patient ID: [name] male   DOB: [birth year] 74 y.o.   MRN: [id number]  HPI: [name] is a 74 y.o. male with a PMH below who presents for follow up of his memory loss.  Since the last visit he has not seen much change in his memory loss.  He continues to be slower with names.  He reports he recently went to the beach he always goes to but got lost.  When he asked for directions the locals reported it was understandable as the road names had changed.   For his sleep he has been doing much better.  He still takes the amitriptyline around 9pm and falls asleep around 11:30 and sleeps till 10 am.    Past Medical History  Diagnosis Date  . Hyperlipidemia   . 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 GERD (gastroesophageal reflux disease)   . Colonic polyp     Tubulovillous adenomatous  . Tobacco abuse     quit 2001  . Gout 12/2007    inadequate aspiration of MTP joint left great toe  06/18/08; -fluid:  red, bloody, cell count not performed (TNP) and no crystals or organisms seen.  [name] COPD (chronic obstructive pulmonary disease)   . Insomnia   . PERFORATION, INTESTINE 06/19/2006    Annotation: During colonoscopy Qualifier: History of  By: Oretha Ellis    . Delirium, acute 2015    After anesthesia for colectomy - resolved in 48hrs   Current Outpatient Prescriptions  Medication Sig Dispense Refill  . amitriptyline (ELAVIL) 25 MG tablet TAKE ONE TABLET BY MOUTH AT BEDTIME AS NEEDED FOR SLEEP. 30 tablet 3  . aspirin 81 MG tablet Take 81 mg by mouth every evening.     . Omega-3 Fatty Acids (FISH OIL) 1200 MG CAPS Take 1,200 mg by mouth daily.     . pantoprazole (PROTONIX) 40 MG tablet Take 1 tablet (40 mg total) by mouth daily. 30 tablet 5  . Polyethyl Glycol-Propyl Glycol (SYSTANE) 0.4-0.3 % SOLN Place 1 drop into both eyes 2 (two) times daily.    . tamsulosin (FLOMAX) 0.4 MG CAPS capsule Take 1 capsule (0.4 mg total) by mouth daily. 30 capsule 2   . atorvastatin (LIPITOR) 40 MG tablet Take 40 mg by mouth at bedtime.    . budesonide-formoterol (SYMBICORT) 160-4.5 MCG/ACT inhaler Inhale 2 puffs into the lungs 2 (two) times daily. 01/11/14- states uses PRN (Patient not taking: Reported on 06/13/2014) 3 Inhaler 1  . chlorhexidine (PERIDEX) 0.12 % solution Use as directed 15 mLs in the mouth or throat 4 (four) times daily as needed (sores in mouth).     No current facility-administered medications for this visit.   Family History  Problem Relation Age of Onset  . Colon cancer Neg Hx   . Pancreatic cancer Neg Hx   . Stomach cancer Neg Hx   . Rectal cancer Neg Hx    History   Social History  . Marital Status: Widowed    Spouse Name: N/A    Number of Children: 1  . Years of Education: N/A   Occupational History  . Retired    Social History Main Topics  . Smoking status: Former Smoker    Types: Cigarettes    Quit date: 08/27/2000  . Smokeless tobacco: Never Used  . Alcohol Use: No     Comment: none x 7 years  . Drug Use: No  . Sexual Activity: None  Other Topics Concern  . None   Social History Narrative   Review of Systems: Review of Systems  Constitutional: Negative for fever, chills, weight loss and malaise/fatigue.  Eyes: Negative for blurred vision.  Respiratory: Negative for cough and shortness of breath.   Cardiovascular: Negative for chest pain.  Gastrointestinal: Negative for diarrhea.  Genitourinary: Negative for dysuria.  Musculoskeletal: Negative for myalgias.  Neurological: Negative for dizziness, tingling, tremors, sensory change, speech change and focal weakness.  Psychiatric/Behavioral: Positive for memory loss. Negative for depression and substance abuse. The patient does not have insomnia.      Objective:  Physical Exam: Filed Vitals:   06/13/14 1518  BP: 122/67  Pulse: 80  Height: 5\' 6"  (1.676 m)  Weight: 142 lb 3.2 oz (64.501 kg)  SpO2: 99%  Physical Exam  Constitutional: He is oriented to  person, place, and time and well-developed, well-nourished, and in no distress.  Cardiovascular: Normal rate, regular rhythm and normal heart sounds.   Pulmonary/Chest: Effort normal and breath sounds normal.  Abdominal: Soft. Bowel sounds are normal.  Musculoskeletal: He exhibits no edema.  Neurological: He is alert and oriented to person, place, and time. No cranial nerve deficit. Gait normal.  MOCA score of 24  Psychiatric: Memory, affect and judgment normal.  Nursing note and vitals reviewed.   Assessment & Plan:  Case discussed with Dr. Lynnae January  Mild cognitive impairment with memory loss Patient MOCA score decreased this visit to 24 from 26 at last visit.  His previous workup so far has been negative. I advised him that the next step should be an MRI and then possibly a referral to Neurology.  He reported if possible he would like to be referred to neurology first, which is reasonable. He does not that he has talked with his daugher about this and has made her his [facility] POA, he also plans to talk to her about his end of life decisions very soon.  Will we discuss this in more detail at his next visit.  Lower urinary tract symptoms (LUTS) Greatly improved with flomax.  Patient wants to defer DRE to next visit.    Medications Ordered No orders of the defined types were placed in this encounter.   Other Orders Orders Placed This Encounter  Procedures  . Ambulatory referral to Neurology    Referral Priority:  Routine    Referral Type:  Consultation    Referral Reason:  Specialty Services Required    Requested Specialty:  Neurology    Number of Visits Requested:  1

## 2014-06-15 NOTE — Progress Notes (Signed)
[facility] Attending  Case discussed with Dr. Hoffman soon after the resident saw the patient.  We reviewed the resident's history and exam and pertinent patient test results.  I agree with the assessment, diagnosis, and plan of care documented in the resident's note. 

## 2014-06-18 ENCOUNTER — Telehealth: Payer: Self-pay | Admitting: *Deleted

## 2014-06-18 NOTE — Telephone Encounter (Addendum)
Received faxed PA request from pt's pharmacy for his amitriptyline 25 mg tabs.  Mediation is not covered by pt plan and costs $4 out of pocket, per pharmacy, pt has requested our office to contact [facility] for authorization.  Request sent for "review"-decision may take up to 72hrs-pending response    [phone] Member ID: [id number] Ref# [id number]

## 2014-06-19 DIAGNOSIS — D485 Neoplasm of uncertain behavior of skin: Secondary | ICD-10-CM | POA: Diagnosis not present

## 2014-06-19 DIAGNOSIS — L821 Other seborrheic keratosis: Secondary | ICD-10-CM | POA: Diagnosis not present

## 2014-06-19 DIAGNOSIS — R238 Other skin changes: Secondary | ICD-10-CM | POA: Diagnosis not present

## 2014-06-19 DIAGNOSIS — X32XXXD Exposure to sunlight, subsequent encounter: Secondary | ICD-10-CM | POA: Diagnosis not present

## 2014-06-19 DIAGNOSIS — L570 Actinic keratosis: Secondary | ICD-10-CM | POA: Diagnosis not present

## 2014-06-19 NOTE — Telephone Encounter (Addendum)
Received faxed documentation that request has been approved through 06/08/2015.  Pharmacy and pt made aware.Despina Hidden Cassady1/12/201611:57 AM  Pt also inquired about his neurology appt- pt informed that referral was placed in workqueue and that office will contact him directly with an appt.  Pt was given contact information to [facility] and instructed to contact the office next week if he doesn't hear anything.Despina Hidden Cassady1/12/201612:03 PM

## 2014-06-25 DIAGNOSIS — H01005 Unspecified blepharitis left lower eyelid: Secondary | ICD-10-CM | POA: Diagnosis not present

## 2014-06-25 DIAGNOSIS — H01002 Unspecified blepharitis right lower eyelid: Secondary | ICD-10-CM | POA: Diagnosis not present

## 2014-06-25 DIAGNOSIS — H01001 Unspecified blepharitis right upper eyelid: Secondary | ICD-10-CM | POA: Diagnosis not present

## 2014-06-25 DIAGNOSIS — H01004 Unspecified blepharitis left upper eyelid: Secondary | ICD-10-CM | POA: Diagnosis not present

## 2014-07-02 ENCOUNTER — Ambulatory Visit (INDEPENDENT_AMBULATORY_CARE_PROVIDER_SITE_OTHER): Payer: Medicare Other | Admitting: Neurology

## 2014-07-02 ENCOUNTER — Encounter: Payer: Self-pay | Admitting: Neurology

## 2014-07-02 VITALS — BP 144/83 | HR 86 | Ht 66.0 in | Wt 142.2 lb

## 2014-07-02 DIAGNOSIS — G3184 Mild cognitive impairment, so stated: Secondary | ICD-10-CM | POA: Diagnosis not present

## 2014-07-02 MED ORDER — MIRTAZAPINE 30 MG PO TABS: 30.0000 mg | ORAL_TABLET | Freq: Every day | ORAL | Status: DC

## 2014-07-02 NOTE — Patient Instructions (Signed)
   Stop the amitriptyline and start the Remeron at night for sleep. We will get a MRI of the brain.

## 2014-07-02 NOTE — Progress Notes (Signed)
Reason for visit: Memory disturbance  [name] is a 74 y.o. male  History of present illness:  Mr. [name] is a 74 year old right-handed white male with a history of a memory disturbance that dates back about one year. The patient indicates that he had surgery, and following surgery he began noting some short-term memory problems. The patient has had troubles with word finding, and memory of recent events. Will occasionally have some issues with directions while driving. He is able to do his finances, and keep up with medications. He may misplace things about the house relatively frequently. He denies any issues with balance, or difficulty controlling the bowels or the bladder. He reports no numbness or weakness on the face, arms, or legs. He has undergone recent blood work for memory issues, and this has been unremarkable with a normal B12 level, thyroid profile, and RPR. The patient has been taking amitriptyline at night, 25 mg. He indicates that this medication was started about one year ago. He is sent to this office for an evaluation.   Past Medical History  Diagnosis Date  . Hyperlipidemia   . 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 GERD (gastroesophageal reflux disease)   . Colonic polyp     Tubulovillous adenomatous  . Tobacco abuse     quit 2001  . Gout 12/2007    inadequate aspiration of MTP joint left great toe  06/18/08; -fluid:  red, bloody, cell count not performed (TNP) and no crystals or organisms seen.  [name] COPD (chronic obstructive pulmonary disease)   . Insomnia   . PERFORATION, INTESTINE 06/19/2006    Annotation: During colonoscopy Qualifier: History of  By: Oretha Ellis    . Delirium, acute 2015    After anesthesia for colectomy - resolved in 48hrs    Past Surgical History  Procedure Laterality Date  . Wrist surgery Left   . Colonoscopy    . Dupuytren contracture release Right   . Cataract extraction Bilateral   . Neck surgery  1970    removal cyst outside neck  .  Laparoscopic partial colectomy Right 01/18/2014    Procedure: LAPAROSCOPIC PARTIAL RIGHT COLECTOMY;  Surgeon: Adin Hector, MD;  Location: [facility];  Service: General;  Laterality: Right;    Family History  Problem Relation Age of Onset  . Colon cancer Neg Hx   . Pancreatic cancer Neg Hx   . Stomach cancer Neg Hx   . Rectal cancer Neg Hx   . Cerebral aneurysm Mother   . Cancer Father     lung cancer/asbestos    Social history:  reports that he quit smoking about 13 years ago. His smoking use included Cigarettes. He has never used smokeless tobacco. He reports that he does not drink alcohol or use illicit drugs.  Medications:  Current Outpatient Prescriptions on File Prior to Visit  Medication Sig Dispense Refill  . aspirin 81 MG tablet Take 81 mg by mouth every evening.     [name] atorvastatin (LIPITOR) 40 MG tablet Take 40 mg by mouth at bedtime.    . budesonide-formoterol (SYMBICORT) 160-4.5 MCG/ACT inhaler Inhale 2 puffs into the lungs 2 (two) times daily. 01/11/14- states uses PRN 3 Inhaler 1  . chlorhexidine (PERIDEX) 0.12 % solution Use as directed 15 mLs in the mouth or throat 4 (four) times daily as needed (sores in mouth).    . Omega-3 Fatty Acids (FISH OIL) 1200 MG CAPS Take 1,200 mg by mouth daily.     . pantoprazole (  PROTONIX) 40 MG tablet Take 1 tablet (40 mg total) by mouth daily. 30 tablet 5  . Polyethyl Glycol-Propyl Glycol (SYSTANE) 0.4-0.3 % SOLN Place 1 drop into both eyes 2 (two) times daily.    . tamsulosin (FLOMAX) 0.4 MG CAPS capsule Take 1 capsule (0.4 mg total) by mouth daily. 30 capsule 2   No current facility-administered medications on file prior to visit.      Allergies  Allergen Reactions  . Ambien [Zolpidem Tartrate] Other (See Comments)    paranoia    ROS:  Out of a complete 14 system review of symptoms, the patient complains only of the following symptoms, and all other reviewed systems are negative. Drooling Eye discharge Shortness of  breath Incontinence of bladder Memory loss  Blood pressure 144/83, pulse 86, height 5\' 6"  (1.676 m), weight 142 lb 3.2 oz (64.501 kg).  Physical Exam  General: The patient is alert and cooperative at the time of the examination.  Eyes: Pupils are equal, round, and reactive to light. Discs are flat bilaterally.  Neck: The neck is supple, no carotid bruits are noted.  Respiratory: The respiratory examination is clear.  Cardiovascular: The cardiovascular examination reveals a regular rate and rhythm, no obvious murmurs or rubs are noted.  Skin: Extremities are without significant edema.  Neurologic Exam  Mental status: The patient is alert and oriented x 3 at the time of the examination. The patient has apparent normal recent and remote memory, with an apparently normal attention span and concentration ability. Mini-Mental Status Examination done today shows a total score of 28/30.  Cranial nerves: Facial symmetry is present. There is good sensation of the face to pinprick and soft touch bilaterally. The strength of the facial muscles and the muscles to head turning and shoulder shrug are normal bilaterally. Speech is well enunciated, no aphasia or dysarthria is noted. Extraocular movements are full. Visual fields are full. The tongue is midline, and the patient has symmetric elevation of the soft palate. No obvious hearing deficits are noted.  Motor: The motor testing reveals 5 over 5 strength of all 4 extremities. Good symmetric motor tone is noted throughout.  Sensory: Sensory testing is intact to pinprick, soft touch, vibration sensation, and position sense on all 4 extremities. No evidence of extinction is noted.  Coordination: Cerebellar testing reveals good finger-nose-finger and heel-to-shin bilaterally.  Gait and station: Gait is normal. Tandem gait is normal. Romberg is negative. No drift is seen.  Reflexes: Deep tendon reflexes are symmetric and normal bilaterally. Toes are  downgoing bilaterally.   Assessment/Plan:  1. Mild memory disturbance  The patient does report some issues with memory that dates back about one year. This is around the time he went on amitriptyline, which may be exacerbating a mild underlying memory issue. The patient will go off amitriptyline, we will use Remeron for sleep instead. He will undergo MRI evaluation of the brain. He will follow-up in or months. If the memory issues continue off of amitriptyline, we may start low-dose Aricept at that time.  Jill Alexanders MD 07/02/2014 7:01 PM  [facility] [address] [place], [place] [address]  Phone [phone] Fax [phone]

## 2014-07-05 ENCOUNTER — Other Ambulatory Visit: Payer: Self-pay | Admitting: Internal Medicine

## 2014-07-19 ENCOUNTER — Telehealth: Payer: Self-pay | Admitting: Neurology

## 2014-07-19 MED ORDER — TEMAZEPAM 15 MG PO CAPS: 15.0000 mg | ORAL_CAPSULE | Freq: Every evening | ORAL | Status: DC | PRN

## 2014-07-19 NOTE — Telephone Encounter (Signed)
I called patient. The Remeron at 30 mg dose has not been effective in helping him sleep. The patient will be switched to a low dose of Restoril taking 15 mg at night.

## 2014-07-19 NOTE — Telephone Encounter (Signed)
Pt is calling wanting to know if Dr. Jannifer Franklin will prescribe a stronger sleeping pill he is currently taking mirtazapine (REMERON) 30 MG tablet. Also pt has concerns regarding the MRI that was ordered.  Please call and advise.

## 2014-07-20 ENCOUNTER — Other Ambulatory Visit: Payer: Self-pay | Admitting: Neurology

## 2014-07-20 DIAGNOSIS — G3184 Mild cognitive impairment, so stated: Secondary | ICD-10-CM

## 2014-08-02 ENCOUNTER — Ambulatory Visit
Admission: RE | Admit: 2014-08-02 | Discharge: 2014-08-02 | Disposition: A | Discharge status: Home / Self Care | Payer: Medicare Other | Source: Ambulatory Visit | Attending: Neurology | Admitting: Neurology

## 2014-08-02 DIAGNOSIS — G3184 Mild cognitive impairment, so stated: Secondary | ICD-10-CM | POA: Diagnosis not present

## 2014-08-02 DIAGNOSIS — Z135 Encounter for screening for eye and ear disorders: Secondary | ICD-10-CM | POA: Diagnosis not present

## 2014-08-06 ENCOUNTER — Telehealth: Payer: Self-pay | Admitting: Neurology

## 2014-08-06 MED ORDER — TEMAZEPAM 30 MG PO CAPS: 30.0000 mg | ORAL_CAPSULE | Freq: Every evening | ORAL | Status: DC | PRN

## 2014-08-06 NOTE — Telephone Encounter (Signed)
    I called the patient. The MRI of the brain shows diffuse atrophy , very minimal small vessel disease. His could correlate with a memory disorder. The patient is not sleeping on 15 mg of Restoril , he did not do well on the Remeron. We will go up to 30 mg of Restoril at night.   MRI brain 08/03/14:  IMPRESSION:  Abnormal MRI brain (without) demonstrating: 1. 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 2. Mild ventriculomegaly on ex vacuo basis. 3. Minimal periventricular and subcortical foci of non-specific gliosis. 4. No acute findings.

## 2014-09-10 DIAGNOSIS — H01002 Unspecified blepharitis right lower eyelid: Secondary | ICD-10-CM | POA: Diagnosis not present

## 2014-09-10 DIAGNOSIS — H01004 Unspecified blepharitis left upper eyelid: Secondary | ICD-10-CM | POA: Diagnosis not present

## 2014-09-10 DIAGNOSIS — H01005 Unspecified blepharitis left lower eyelid: Secondary | ICD-10-CM | POA: Diagnosis not present

## 2014-09-10 DIAGNOSIS — H01001 Unspecified blepharitis right upper eyelid: Secondary | ICD-10-CM | POA: Diagnosis not present

## 2014-09-28 ENCOUNTER — Telehealth: Payer: Self-pay | Admitting: Neurology

## 2014-09-28 NOTE — Telephone Encounter (Signed)
Patient called wanting to speak to Dr. Jannifer Franklin' nurse in regards to both scripts he is currently taking, temazepam (RESTORIL) 30 MG capsule and MIRTAZAPINE 30mg . Patient has questions in regards to both scripts such as, should he be taking both scripts together? Please call and advice # [phone]

## 2014-09-28 NOTE — Telephone Encounter (Signed)
I called back.  Spoke with patient.  He verified he has not been taking both meds, only Temazepam.  Wanted [place] to know he will be discarding Mirtazapine because he is no longer taking it.  Says he is due for a refill on Temazepam, but sees that he has refills remaining on his bottle.  He will cal the pharmacy and ask them to refill.  Per previous notes:  Kathrynn Ducking, MD at 07/19/2014 1:54 PM     Status: Signed       Expand All Collapse All   I called patient. The Remeron at 30 mg dose has not been effective in helping him sleep. The patient will be switched to a low dose of Restoril taking 15 mg at night.       Kathrynn Ducking, MD at 08/06/2014 11:00 AM     Status: Signed       Expand All Collapse All    I called the patient. The MRI of the brain shows diffuse atrophy , very minimal small vessel disease. His could correlate with a memory disorder. The patient is not sleeping on 15 mg of Restoril , he did not do well on the Remeron. We will go up to 30 mg of Restoril at night.

## 2014-10-01 DIAGNOSIS — M25511 Pain in right shoulder: Secondary | ICD-10-CM | POA: Diagnosis not present

## 2014-10-04 DIAGNOSIS — G8929 Other chronic pain: Secondary | ICD-10-CM | POA: Diagnosis not present

## 2014-10-04 DIAGNOSIS — M25511 Pain in right shoulder: Secondary | ICD-10-CM | POA: Diagnosis not present

## 2014-10-10 DIAGNOSIS — M25511 Pain in right shoulder: Secondary | ICD-10-CM | POA: Diagnosis not present

## 2014-10-10 DIAGNOSIS — G8929 Other chronic pain: Secondary | ICD-10-CM | POA: Diagnosis not present

## 2014-10-16 ENCOUNTER — Other Ambulatory Visit: Payer: Self-pay | Admitting: Internal Medicine

## 2014-10-17 DIAGNOSIS — M25511 Pain in right shoulder: Secondary | ICD-10-CM | POA: Diagnosis not present

## 2014-10-17 DIAGNOSIS — G8929 Other chronic pain: Secondary | ICD-10-CM | POA: Diagnosis not present

## 2014-10-25 DIAGNOSIS — M25511 Pain in right shoulder: Secondary | ICD-10-CM | POA: Diagnosis not present

## 2014-10-25 DIAGNOSIS — G8929 Other chronic pain: Secondary | ICD-10-CM | POA: Diagnosis not present

## 2014-10-30 DIAGNOSIS — H01002 Unspecified blepharitis right lower eyelid: Secondary | ICD-10-CM | POA: Diagnosis not present

## 2014-10-30 DIAGNOSIS — H01001 Unspecified blepharitis right upper eyelid: Secondary | ICD-10-CM | POA: Diagnosis not present

## 2014-10-30 DIAGNOSIS — H01004 Unspecified blepharitis left upper eyelid: Secondary | ICD-10-CM | POA: Diagnosis not present

## 2014-10-30 DIAGNOSIS — H01003 Unspecified blepharitis right eye, unspecified eyelid: Secondary | ICD-10-CM | POA: Diagnosis not present

## 2014-10-31 DIAGNOSIS — M25511 Pain in right shoulder: Secondary | ICD-10-CM | POA: Diagnosis not present

## 2014-10-31 DIAGNOSIS — G8929 Other chronic pain: Secondary | ICD-10-CM | POA: Diagnosis not present

## 2014-11-01 ENCOUNTER — Telehealth: Payer: Self-pay | Admitting: *Deleted

## 2014-11-01 ENCOUNTER — Ambulatory Visit (INDEPENDENT_AMBULATORY_CARE_PROVIDER_SITE_OTHER): Payer: Medicare Other | Admitting: Internal Medicine

## 2014-11-01 ENCOUNTER — Encounter: Payer: Self-pay | Admitting: Internal Medicine

## 2014-11-01 VITALS — BP 150/80 | HR 60 | Temp 97.8°F | Ht 66.0 in | Wt 138.3 lb

## 2014-11-01 DIAGNOSIS — R450 Nervousness: Secondary | ICD-10-CM | POA: Diagnosis not present

## 2014-11-01 DIAGNOSIS — F419 Anxiety disorder, unspecified: Secondary | ICD-10-CM | POA: Insufficient documentation

## 2014-11-01 LAB — GLUCOSE, CAPILLARY: Glucose-Capillary: 85 mg/dL (ref 65–99)

## 2014-11-01 LAB — TSH: TSH: 1.094 u[IU]/mL (ref 0.350–4.500)

## 2014-11-01 MED ORDER — ALPRAZOLAM 0.25 MG PO TABS: 0.2500 mg | ORAL_TABLET | Freq: Three times a day (TID) | ORAL | Status: DC | PRN

## 2014-11-01 NOTE — Progress Notes (Signed)
   Subjective:    Patient ID: [name], male    DOB: 18-Apr-1941, 74 y.o.   MRN: [id number]  HPI  74 yo male with COPD, mild cognitive impairment with memory loss, insomnia here with the feeling of nervousness.  See problem based charting for details.   Review of Systems  Constitutional: Negative for fever, chills and fatigue.  HENT: Negative for congestion, sinus pressure and sore throat.   Respiratory: Negative for shortness of breath and wheezing.   Cardiovascular: Negative for chest pain, palpitations and leg swelling.  Gastrointestinal: Negative for nausea, vomiting and diarrhea.  Endocrine: Negative for polyuria.  Genitourinary: Negative for dysuria.  Musculoskeletal: Negative for back pain, joint swelling and arthralgias.  Skin: Negative.   Allergic/Immunologic: Negative.   Neurological: Negative for dizziness and weakness.  Psychiatric/Behavioral: The patient is nervous/anxious.        Objective:   Physical Exam  Constitutional: He is oriented to person, place, and time. He appears well-developed and well-nourished.  Seems very nervous and shaking during speech.  HENT:  Head: Normocephalic and atraumatic.  Nose: Nose normal.  Mouth/Throat: Oropharynx is clear and moist.  Eyes: Conjunctivae and EOM are normal. Pupils are equal, round, and reactive to light.  Neck: Normal range of motion.  Cardiovascular: Normal rate and regular rhythm.  Exam reveals no gallop and no friction rub.   No murmur heard. Pulmonary/Chest: Breath sounds normal. No respiratory distress. He has no wheezes. He has no rales. He exhibits no tenderness.  Abdominal: Soft. He exhibits no distension and no mass. There is no tenderness.  Musculoskeletal: Normal range of motion. He exhibits no edema or tenderness.  Neurological: He is alert and oriented to person, place, and time.  Has some shuffling gait and decreased arm swing.  Does not have bradykinesis, rigidity. Speech is normal.     Filed  Vitals:   11/01/14 1321  BP: 150/80  Pulse: 60  Temp: 97.8 F (36.6 C)         Assessment & Plan:  See problem based charting.

## 2014-11-01 NOTE — Assessment & Plan Note (Addendum)
Patient has been feeling nervous for last 3-4 days. He feels stressed about about his health problems. He worries about his memory problems. He is also having some eye infection, being managed by his optho. Denies any other problems such as night sweat, fever, loss of energy. Has been sleeping well with his Restoril. No GI symptoms. No change in weight.  Talking to his PCP, he does seem to get anxious about his health problems. He is bothered by his mild cognitive disorder. He is fully functional and his overall memory seems to be intact. His last MOCA by his PCP was 26, which is within normal limits. This may be just age related change. I attempted to explain this to him that I think it is unlikely that he has dementia given his borderline MOCA score and not having any functional deficits of any IADL and ADL's.   Current nervous episode seems to be worsening of his baseline anxiety about health problems given he has some recent eye problems and also the recent diagnosis of congnitive impairment. No signs of depression on my exam.   I will treat for acute anxiety with Xanax 0.25mg  TID PRN #20 tabs. I explained that if it persists then we will have to try something else that's safe on a longer term, first option may be Zoloft. I did not start zoloft right now as it does take time to show effect and also this may be temporary which does not require longer term treatment. Asked him to stop taking restoril while he is taking Xanax.  Will check TSH level to rule out hyperthyroidism as the cause.  He does have shuffling gait on my exam which may also suggest early stage of PD. Has some mild tremors. Al though he does not have bradykinesis, rigidity.   Asked to follow up with PCP Dr. Heber Coleman in 1 month.  Addendum: TSH normal.

## 2014-11-01 NOTE — Telephone Encounter (Signed)
Pt called past few days problems with feeling nervous and anxiety.Pt denies any changes in health or meds. Pt request to be seen today. Appt made 1:15PM Dr Genene Churn. Hilda Blades Nitesh Pitstick RN 10/22/14 10:40AM

## 2014-11-01 NOTE — Patient Instructions (Addendum)
Try taking Xanax for your anxiety/nervousness. While you are taking the Xanax, don't take your sleeping medicine   Let us know if you are not feeling better.  Follow up in 1 month.

## 2014-11-07 DIAGNOSIS — M25511 Pain in right shoulder: Secondary | ICD-10-CM | POA: Diagnosis not present

## 2014-11-07 DIAGNOSIS — G8929 Other chronic pain: Secondary | ICD-10-CM | POA: Diagnosis not present

## 2014-11-07 NOTE — Progress Notes (Signed)
Internal Medicine Clinic Attending  Case discussed with Dr. Ahmed at the time of the visit.  We reviewed the resident's history and exam and pertinent patient test results.  I agree with the assessment, diagnosis, and plan of care documented in the resident's note. 

## 2014-11-08 ENCOUNTER — Other Ambulatory Visit: Payer: Self-pay | Admitting: Internal Medicine

## 2014-11-12 DIAGNOSIS — M25511 Pain in right shoulder: Secondary | ICD-10-CM | POA: Diagnosis not present

## 2014-11-13 ENCOUNTER — Other Ambulatory Visit: Payer: Self-pay | Admitting: *Deleted

## 2014-11-13 DIAGNOSIS — R450 Nervousness: Secondary | ICD-10-CM

## 2014-11-13 MED ORDER — ALPRAZOLAM 0.25 MG PO TABS: 0.2500 mg | ORAL_TABLET | Freq: Two times a day (BID) | ORAL | Status: DC | PRN

## 2014-11-13 NOTE — Telephone Encounter (Signed)
Pt has scheduled appointment on 6/24 and is asking for enough xanax to last until then. Pt # [id number]

## 2014-11-13 NOTE — Telephone Encounter (Signed)
Pt rescheduled for the 16 th. Rx called in

## 2014-11-13 NOTE — Telephone Encounter (Signed)
Using med about BID based on date Rx'd. This was to be temp med only and if nervousness persisted was to come in and discuss zoloft type med. Has appt PCP Hoffman but based on past usage, this 20 will not last until the appt. Can Dr Jodene Nam appt be moved up?

## 2014-11-15 DIAGNOSIS — H01001 Unspecified blepharitis right upper eyelid: Secondary | ICD-10-CM | POA: Diagnosis not present

## 2014-11-15 DIAGNOSIS — H01002 Unspecified blepharitis right lower eyelid: Secondary | ICD-10-CM | POA: Diagnosis not present

## 2014-11-15 DIAGNOSIS — H01004 Unspecified blepharitis left upper eyelid: Secondary | ICD-10-CM | POA: Diagnosis not present

## 2014-11-15 DIAGNOSIS — H01003 Unspecified blepharitis right eye, unspecified eyelid: Secondary | ICD-10-CM | POA: Diagnosis not present

## 2014-11-22 ENCOUNTER — Encounter: Payer: Self-pay | Admitting: Internal Medicine

## 2014-11-22 ENCOUNTER — Ambulatory Visit (INDEPENDENT_AMBULATORY_CARE_PROVIDER_SITE_OTHER): Payer: Medicare Other | Admitting: Internal Medicine

## 2014-11-22 VITALS — BP 107/60 | HR 67 | Temp 97.7°F | Ht 66.0 in | Wt 137.4 lb

## 2014-11-22 DIAGNOSIS — F5101 Primary insomnia: Secondary | ICD-10-CM

## 2014-11-22 DIAGNOSIS — F419 Anxiety disorder, unspecified: Secondary | ICD-10-CM | POA: Diagnosis not present

## 2014-11-22 DIAGNOSIS — M25511 Pain in right shoulder: Secondary | ICD-10-CM | POA: Diagnosis not present

## 2014-11-22 DIAGNOSIS — E785 Hyperlipidemia, unspecified: Secondary | ICD-10-CM | POA: Diagnosis not present

## 2014-11-22 DIAGNOSIS — R450 Nervousness: Secondary | ICD-10-CM

## 2014-11-22 DIAGNOSIS — G3184 Mild cognitive impairment, so stated: Secondary | ICD-10-CM | POA: Diagnosis present

## 2014-11-22 DIAGNOSIS — G8929 Other chronic pain: Secondary | ICD-10-CM | POA: Diagnosis not present

## 2014-11-22 MED ORDER — ALPRAZOLAM 0.25 MG PO TABS: 0.2500 mg | ORAL_TABLET | Freq: Two times a day (BID) | ORAL | Status: DC | PRN

## 2014-11-22 NOTE — Assessment & Plan Note (Signed)
-   Per Dr. Jannifer Franklin notes he planned to see him at the end of May, it appears he will start patient on Aricept I offered to do this but the patient would like to see Dr Jannifer Franklin again and discuss it. - Assisted in scheduling appointment with Dr Jannifer Franklin.

## 2014-11-22 NOTE — Progress Notes (Signed)
[facility] Subjective:   Patient ID: [name] male   DOB: 02-22-1941 74 y.o.   MRN: [id number]  HPI: [name] is a 74 y.o. male with a PMH detailed below who presents for follow up for anxiety. For the last few visits I have been helping to manage his MCI, he did see Dr Jannifer Franklin (Neurology) back in January and is due for his follow up visit.  Since that time he was taken off amitriptyline (throught this may contribute) and he has still continue to avoid atrovastatin due to concern this may have contributed. He reports that discontinuing these medications has not helped his memory issues at all.  He does report that is daugher has offered that he may move in with her when he is ready but he does not feel ready at this point.  More recently he was seen for anxiety.  He was given a short course of Xanax and asked to follow up.  He has been somewhat concerned about taking xanax if it will interfere with his memory but does report that he had one panic attack which was aborted with the xanax.  He has also taken it with great results to aid in falling asleep, he has apparently alternated temazepam and xanax for sleep as he has been instructed to no take both.  Overall he feels much better with Xanax and likes to have the option to take this with his anxiety.  He denies any depression.  ADLs and IADLs: Patient lives alone, he has not trouble with his ADLs, he reports no difficulty managing his finances, he is continuing to drive (reports no issues, never has gotten lost, does not like to drive at night due to vision), he does his own shopping he prepares his own meals, he has a cell phones and no trouble with communication (also uses Email and even MyChart), he denies any trouble managing his medications and he is able to manage his house and basic maintenance.  Past Medical History  Diagnosis Date  . Hyperlipidemia   . 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 GERD (gastroesophageal reflux  disease)   . Colonic polyp     Tubulovillous adenomatous  . Tobacco abuse     quit 2001  . Gout 12/2007    inadequate aspiration of MTP joint left great toe  06/18/08; -fluid:  red, bloody, cell count not performed (TNP) and no crystals or organisms seen.  [name] COPD (chronic obstructive pulmonary disease)   . Insomnia   . PERFORATION, INTESTINE 06/19/2006    Annotation: During colonoscopy Qualifier: History of  By: Oretha Ellis    . Delirium, acute 2015    After anesthesia for colectomy - resolved in 48hrs   Current Outpatient Prescriptions  Medication Sig Dispense Refill  . ALPRAZolam (XANAX) 0.25 MG tablet Take 1 tablet (0.25 mg total) by mouth 2 (two) times daily as needed for anxiety or sleep. 60 tablet 1  . aspirin 81 MG tablet Take 81 mg by mouth every evening.     [name] atorvastatin (LIPITOR) 40 MG tablet Take 40 mg by mouth at bedtime.    . budesonide-formoterol (SYMBICORT) 160-4.5 MCG/ACT inhaler Inhale 2 puffs into the lungs 2 (two) times daily. 01/11/14- states uses PRN 3 Inhaler 1  . chlorhexidine (PERIDEX) 0.12 % solution Use as directed 15 mLs in the mouth or throat 4 (four) times daily as needed (sores in mouth).    . Multiple Vitamin (MULTIVITAMIN) tablet Take 1 tablet by  mouth daily.    . naproxen sodium (ANAPROX) 220 MG tablet Take 220 mg by mouth as needed.    . Omega-3 Fatty Acids (FISH OIL) 1200 MG CAPS Take 1,200 mg by mouth daily.     . pantoprazole (PROTONIX) 40 MG tablet TAKE ONE TABLET BY MOUTH ONCE DAILY 30 tablet 2  . Polyethyl Glycol-Propyl Glycol (SYSTANE) 0.4-0.3 % SOLN Place 1 drop into both eyes 2 (two) times daily.    . tamsulosin (FLOMAX) 0.4 MG CAPS capsule TAKE ONE CAPSULE BY MOUTH ONCE DAILY 30 capsule 5  . temazepam (RESTORIL) 30 MG capsule Take 1 capsule (30 mg total) by mouth at bedtime as needed for sleep. 30 capsule 3   No current facility-administered medications for this visit.   Family History  Problem Relation Age of Onset  . Colon cancer  Neg Hx   . Pancreatic cancer Neg Hx   . Stomach cancer Neg Hx   . Rectal cancer Neg Hx   . Cerebral aneurysm Mother   . Cancer Father     lung cancer/asbestos   History   Social History  . Marital Status: Widowed    Spouse Name: N/A  . Number of Children: 1  . Years of Education: N/A   Occupational History  . Retired    Social History Main Topics  . Smoking status: Former Smoker    Types: Cigarettes    Quit date: 08/27/2000  . Smokeless tobacco: Never Used  . Alcohol Use: No     Comment: none x 7 years  . Drug Use: No  . Sexual Activity: Not on file   Other Topics Concern  . None   Social History Narrative   Patient is right handed.   Patient drinks about 3 cups caffeine daily   Review of Systems: Review of Systems  Constitutional: Negative for fever, chills, weight loss and malaise/fatigue.  Eyes: Negative for blurred vision.  Respiratory: Negative for cough and shortness of breath.   Cardiovascular: Negative for chest pain and leg swelling.  Gastrointestinal: Negative for heartburn and abdominal pain.  Genitourinary: Negative for dysuria.  Musculoskeletal: Negative for myalgias.  Neurological: Negative for dizziness and headaches.  Endo/Heme/Allergies: Negative for polydipsia.  Psychiatric/Behavioral: Positive for memory loss. Negative for depression and suicidal ideas. The patient is nervous/anxious and has insomnia.      Objective:  Physical Exam: Filed Vitals:   11/22/14 1005  BP: 107/60  Pulse: 67  Temp: 97.7 F (36.5 C)  TempSrc: Oral  Height: 5\' 6"  (1.676 m)  Weight: 137 lb 6.4 oz (62.324 kg)  SpO2: 99%  Physical Exam  Constitutional: He is oriented to person, place, and time and well-developed, well-nourished, and in no distress. No distress.  HENT:  Head: Normocephalic and atraumatic.  Eyes: Conjunctivae are normal.  Cardiovascular: Normal rate, regular rhythm, normal heart sounds and intact distal pulses.   No murmur  heard. Pulmonary/Chest: Effort normal and breath sounds normal. No respiratory distress. He has no wheezes. He has no rales.  Abdominal: Soft. Bowel sounds are normal. He exhibits no distension. There is no tenderness.  Musculoskeletal: He exhibits no edema.  Neurological: He is alert and oriented to person, place, and time.  Skin: Skin is warm and dry. He is not diaphoretic.  Psychiatric: Memory, affect and judgment normal.  Nursing note and vitals reviewed.   Assessment & Plan:  Case discussed with Dr. Daryll Drown  Mild cognitive impairment with memory loss - Per Dr. Jannifer Franklin notes he planned to see him  at the end of May, it appears he will start patient on Aricept I offered to do this but the patient would like to see Dr Jannifer Franklin again and discuss it. - Assisted in scheduling appointment with Dr Jannifer Franklin.  Primary insomnia - Appears to be doing well, Xanax is not a great option long term but he has had a lot of changes in his health and a component of anxiety and it is reasonable to try low does of xanax for a short time.  Discussed again caution with using both xanax and temazepam.  Anxiety disorder - He has done very well with Xanax and would like a refill. - I did discuss with him at length that Xanax is good for panic attacks but must be used with caution especially given his age, he did discuss some of the risks and that it is on there Beers list of medications to avoid. - We will go ahead and try a short course to Xanax 0.25mg  BIDPRN for now.  He is concerned about the side effects of SSRI and this anxiety my be temporary.  Hyperlipidemia - We will recheck a fasting lipid panel to help guide treatment.  He wants to avoid medications if possible.    Medications Ordered Meds ordered this encounter  Medications  . ALPRAZolam (XANAX) 0.25 MG tablet    Sig: Take 1 tablet (0.25 mg total) by mouth 2 (two) times daily as needed for anxiety or sleep.    Dispense:  60 tablet    Refill:  1     May be filled on or after 11/27/14   Other Orders Orders Placed This Encounter  Procedures  . Lipid Profile    Standing Status: Future     Number of Occurrences:      Standing Expiration Date: 12/11/2014

## 2014-11-22 NOTE — Assessment & Plan Note (Signed)
-   He has done very well with Xanax and would like a refill. - I did discuss with him at length that Xanax is good for panic attacks but must be used with caution especially given his age, he did discuss some of the risks and that it is on there Beers list of medications to avoid. - We will go ahead and try a short course to Xanax 0.25mg  BIDPRN for now.  He is concerned about the side effects of SSRI and this anxiety my be temporary.

## 2014-11-22 NOTE — Progress Notes (Signed)
Appt w/Dr Doyne Keel scheduled for June 28th @ 0830AM; appt card given to pt.

## 2014-11-22 NOTE — Assessment & Plan Note (Signed)
-   Appears to be doing well, Xanax is not a great option long term but he has had a lot of changes in his health and a component of anxiety and it is reasonable to try low does of xanax for a short time.  Discussed again caution with using both xanax and temazepam.

## 2014-11-22 NOTE — Patient Instructions (Signed)
General Instructions:  Please come back next week fasting to complete the cholesterol blood work.  Please follow up with Dr. Jannifer Franklin about your memory problems.  Please bring your medicines with you each time you come to clinic.  Medicines may include prescription medications, over-the-counter medications, herbal remedies, eye drops, vitamins, or other pills.   Progress Toward Treatment Goals:  No flowsheet data found.  Self Care Goals & Plans:  No flowsheet data found.  No flowsheet data found.   Care Management & Community Referrals:  No flowsheet data found.

## 2014-11-22 NOTE — Assessment & Plan Note (Signed)
-   We will recheck a fasting lipid panel to help guide treatment.  He wants to avoid medications if possible.

## 2014-11-27 ENCOUNTER — Other Ambulatory Visit (INDEPENDENT_AMBULATORY_CARE_PROVIDER_SITE_OTHER): Payer: Medicare Other

## 2014-11-27 DIAGNOSIS — E785 Hyperlipidemia, unspecified: Secondary | ICD-10-CM | POA: Diagnosis not present

## 2014-11-27 LAB — LIPID PANEL
Cholesterol: 228 mg/dL — ABNORMAL HIGH (ref 0–200)
HDL: 41 mg/dL (ref >=40)
LDL CALC: 162 mg/dL — AB (ref 0–99)
Total CHOL/HDL Ratio: 5.6 Ratio
Triglycerides: 123 mg/dL (ref <150)
VLDL: 25 mg/dL (ref 0–40)

## 2014-11-27 NOTE — Progress Notes (Signed)
[facility] Attending  Case discussed with Dr. Hoffman soon after the resident saw the patient.  We reviewed the resident's history and exam and pertinent patient test results.  I agree with the assessment, diagnosis, and plan of care documented in the resident's note. 

## 2014-11-28 ENCOUNTER — Encounter: Payer: Self-pay | Admitting: Internal Medicine

## 2014-11-30 ENCOUNTER — Ambulatory Visit: Payer: Medicare Other | Admitting: Internal Medicine

## 2014-12-04 ENCOUNTER — Encounter: Payer: Self-pay | Admitting: Neurology

## 2014-12-04 ENCOUNTER — Ambulatory Visit (INDEPENDENT_AMBULATORY_CARE_PROVIDER_SITE_OTHER): Payer: Medicare Other | Admitting: Neurology

## 2014-12-04 VITALS — BP 123/75 | HR 67 | Ht 66.0 in | Wt 137.4 lb

## 2014-12-04 DIAGNOSIS — F5101 Primary insomnia: Secondary | ICD-10-CM

## 2014-12-04 DIAGNOSIS — G3184 Mild cognitive impairment, so stated: Secondary | ICD-10-CM

## 2014-12-04 DIAGNOSIS — F419 Anxiety disorder, unspecified: Secondary | ICD-10-CM | POA: Diagnosis not present

## 2014-12-04 MED ORDER — DONEPEZIL HCL 5 MG PO TABS: 5.0000 mg | ORAL_TABLET | Freq: Every day | ORAL | Status: DC

## 2014-12-04 NOTE — Patient Instructions (Signed)
     Begin Aricept (donepezil) at 5 mg at night for one month. If this medication is well-tolerated, please call our office and we will call in a prescription for the 10 mg tablets. Look out for side effects that may include nausea, diarrhea, weight loss, or stomach cramps. This medication will also cause a runny nose, therefore there is no need for allergy medications for this purpose.   We will start Aricept for memory, we will follow-up in about 6 months. If you have problems on Aricept, please call. In the future, if you are interested in research for memory issues, please let me know, I will have our research coordinator contact you.

## 2014-12-04 NOTE — Progress Notes (Signed)
Reason for visit: Memory disorder  [name] is an 74 y.o. male  History of present illness:  [name] is a 74 year old right-handed white male with a history of a progressive memory disorder. The patient has been on amitriptyline, he was taken off of this medication, and he has had some issues with sleep. He did not fully respond to Remeron or Restoril, he is now on alprazolam in low dose taking 0.25 mg at night for sleep and for some issues with a panic disorder. The patient has also been recently treated for shoulder bursitis. The patient does not believe that he has gained much cognitive functioning coming off of amitriptyline. He is still able to perform all activities of daily living, he drives a car, pays the bills and does the finances, and is able to manage his medications and appointments. He returns for further evaluation.  Past Medical History  Diagnosis Date  . Hyperlipidemia   . 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 GERD (gastroesophageal reflux disease)   . Colonic polyp     Tubulovillous adenomatous  . Tobacco abuse     quit 2001  . Gout 12/2007    inadequate aspiration of MTP joint left great toe  06/18/08; -fluid:  red, bloody, cell count not performed (TNP) and no crystals or organisms seen.  [name] COPD (chronic obstructive pulmonary disease)   . Insomnia   . PERFORATION, INTESTINE 06/19/2006    Annotation: During colonoscopy Qualifier: History of  By: Oretha Ellis    . Delirium, acute 2015    After anesthesia for colectomy - resolved in 48hrs    Past Surgical History  Procedure Laterality Date  . Wrist surgery Left   . Colonoscopy    . Dupuytren contracture release Right   . Cataract extraction Bilateral   . Neck surgery  1970    removal cyst outside neck  . Laparoscopic partial colectomy Right 01/18/2014    Procedure: LAPAROSCOPIC PARTIAL RIGHT COLECTOMY;  Surgeon: Adin Hector, MD;  Location: [facility];  Service: General;  Laterality: Right;    Family History    Problem Relation Age of Onset  . Colon cancer Neg Hx   . Pancreatic cancer Neg Hx   . Stomach cancer Neg Hx   . Rectal cancer Neg Hx   . Cerebral aneurysm Mother   . Cancer Father     lung cancer/asbestos    Social history:  reports that he quit smoking about 14 years ago. His smoking use included Cigarettes. He has never used smokeless tobacco. He reports that he does not drink alcohol or use illicit drugs.    Allergies  Allergen Reactions  . Ambien [Zolpidem Tartrate] Other (See Comments)    paranoia    Medications:  Prior to Admission medications   Medication Sig Start Date End Date Taking? Authorizing Provider  ALPRAZolam (XANAX) 0.25 MG tablet Take 1 tablet (0.25 mg total) by mouth 2 (two) times daily as needed for anxiety or sleep. Patient taking differently: Take 0.25 mg by mouth 3 (three) times daily as needed for anxiety or sleep.  11/22/14 11/22/15 Yes Lucious Groves, DO  aspirin 81 MG tablet Take 81 mg by mouth every evening.    Yes Historical Provider, MD  budesonide-formoterol (SYMBICORT) 160-4.5 MCG/ACT inhaler Inhale 2 puffs into the lungs 2 (two) times daily. 01/11/14- states uses PRN 01/12/14  Yes Lucious Groves, DO  Multiple Vitamin (MULTIVITAMIN) tablet Take 1 tablet by mouth daily.   Yes Historical Provider,  MD  naproxen sodium (ANAPROX) 220 MG tablet Take 220 mg by mouth as needed.   Yes Historical Provider, MD  Omega 3 1000 MG CAPS Take 1 capsule by mouth daily.   Yes Historical Provider, MD  pantoprazole (PROTONIX) 40 MG tablet TAKE ONE TABLET BY MOUTH ONCE DAILY 11/08/14  Yes Lucious Groves, DO  Polyethyl Glycol-Propyl Glycol (SYSTANE) 0.4-0.3 % SOLN Place 1 drop into both eyes 2 (two) times daily.   Yes Historical Provider, MD  tamsulosin (FLOMAX) 0.4 MG CAPS capsule TAKE ONE CAPSULE BY MOUTH ONCE DAILY 07/06/14  Yes Lucious Groves, DO  donepezil (ARICEPT) 5 MG tablet Take 1 tablet (5 mg total) by mouth at bedtime. 12/04/14   Kathrynn Ducking, MD    ROS:  Out of  a complete 14 system review of symptoms, the patient complains only of the following symptoms, and all other reviewed systems are negative.  Light sensitivity, blurred vision Shortness of breath Insomnia Memory loss Anxiety  Blood pressure 123/75, pulse 67, height 5\' 6"  (1.676 m), weight 137 lb 6.4 oz (62.324 kg).  Physical Exam  General: The patient is alert and cooperative at the time of the examination.  Skin: No significant peripheral edema is noted.   Neurologic Exam  Mental status: The patient is alert and oriented x 3 at the time of the examination. The patient has apparent normal recent and remote memory, with an apparently normal attention span and concentration ability. Mini-Mental Status Examination done today shows a total score 28-30. The patient is able to name 6 animals in 30 seconds.   Cranial nerves: Facial symmetry is present. Speech is normal, no aphasia or dysarthria is noted. Extraocular movements are full. Visual fields are full.  Motor: The patient has good strength in all 4 extremities.  Sensory examination: Soft touch sensation is symmetric on the face, arms, and legs.  Coordination: The patient has good finger-nose-finger and heel-to-shin bilaterally.  Gait and station: The patient has a normal gait. Tandem gait is slightly unsteady. Romberg is negative. No drift is seen.  Reflexes: Deep tendon reflexes are symmetric.   Assessment/Plan:  1. Memory disturbance  2. Anxiety disorder  3. Chronic insomnia  The patient is doing well with his sleep on the low-dose alprazolam. The patient will be started on Aricept today, and the dose will be increased gradually. The patient may be interested in clinical research in the future. He will contact our office if this is something that he wishes to gain information about. He will otherwise follow-up in 6 months.  Jill Alexanders MD 12/04/2014 3:37 PM  [facility] [address] [place], [place] [address]  Phone [phone] Fax [phone]

## 2014-12-24 DIAGNOSIS — M25511 Pain in right shoulder: Secondary | ICD-10-CM | POA: Diagnosis not present

## 2015-01-13 ENCOUNTER — Other Ambulatory Visit: Payer: Self-pay | Admitting: Internal Medicine

## 2015-01-14 ENCOUNTER — Telehealth: Payer: Self-pay | Admitting: Neurology

## 2015-01-14 MED ORDER — DONEPEZIL HCL 10 MG PO TABS: 10.0000 mg | ORAL_TABLET | Freq: Every day | ORAL | Status: DC

## 2015-01-14 NOTE — Telephone Encounter (Signed)
Patient called stating the aricept is working well. He states Dr Jannifer Franklin was to increase doseage from 58m to 10mg . Patient can be reached at [phone].

## 2015-01-14 NOTE — Telephone Encounter (Signed)
Last OV note says: Begin Aricept (donepezil) at 5 mg at night for one month. If this medication is well-tolerated, please call our office and we will call in a prescription for the 10 mg tablets Rx has been updated and sent.  I called the patient back.  He is aware.

## 2015-01-15 ENCOUNTER — Other Ambulatory Visit: Payer: Self-pay

## 2015-01-15 MED ORDER — DONEPEZIL HCL 10 MG PO TABS: 10.0000 mg | ORAL_TABLET | Freq: Every day | ORAL | Status: DC

## 2015-01-15 NOTE — Telephone Encounter (Signed)
Rx was previously sent, however, we have resent it.  I called the pharmacy, and they have confirmed receipt.  I called the patient back.  He is aware.

## 2015-01-15 NOTE — Telephone Encounter (Signed)
Patient is calling back. [facility] on [address] says they don't have the prescription for Aricept yet.

## 2015-02-09 ENCOUNTER — Other Ambulatory Visit: Payer: Self-pay | Admitting: Internal Medicine

## 2015-02-10 ENCOUNTER — Other Ambulatory Visit: Payer: Self-pay | Admitting: Internal Medicine

## 2015-02-12 NOTE — Telephone Encounter (Signed)
Could you please call in this script

## 2015-02-12 NOTE — Telephone Encounter (Signed)
Called to pharm 

## 2015-03-04 ENCOUNTER — Telehealth: Payer: Self-pay | Admitting: Neurology

## 2015-03-04 MED ORDER — ALPRAZOLAM 0.5 MG PO TABS: 0.5000 mg | ORAL_TABLET | Freq: Every evening | ORAL | Status: DC | PRN

## 2015-03-04 NOTE — Telephone Encounter (Signed)
Patient is calling and states that the alprazolam 0.25 mg is working well for anxiety but he is having trouble sleeping. Please call.

## 2015-03-04 NOTE — Telephone Encounter (Signed)
I called patient. The patient is only taking 0.25 mg at night for sleep, we will increase to 0.5 mg. He is having some issues with insomnia.

## 2015-03-04 NOTE — Telephone Encounter (Signed)
I called the patient. He states the Xanax is helping with his anxiety but not as much for sleeping. He wondered if he could double his dose of the Xanax. I advised Dr. Jannifer Franklin would have to give him permission for that. He also stated that he has a new tremor that started about 1 week ago. It is in his right hand/arm. He states that he has bursitis in that hand that he has been stretching and exercising it. He wondered if that could be the cause of the tremor. I advised that Dr. Jannifer Franklin is out of the office today but I would send this message to him and we would call him back tomorrow. Patient verbalized understanding.

## 2015-04-04 ENCOUNTER — Encounter: Payer: Self-pay | Admitting: Internal Medicine

## 2015-04-04 ENCOUNTER — Ambulatory Visit (INDEPENDENT_AMBULATORY_CARE_PROVIDER_SITE_OTHER): Payer: Medicare Other | Admitting: Internal Medicine

## 2015-04-04 VITALS — BP 114/60 | HR 71 | Ht 66.0 in | Wt 137.5 lb

## 2015-04-04 DIAGNOSIS — G3184 Mild cognitive impairment, so stated: Secondary | ICD-10-CM | POA: Diagnosis not present

## 2015-04-04 DIAGNOSIS — Z23 Encounter for immunization: Secondary | ICD-10-CM | POA: Diagnosis not present

## 2015-04-04 DIAGNOSIS — E785 Hyperlipidemia, unspecified: Secondary | ICD-10-CM | POA: Diagnosis not present

## 2015-04-04 DIAGNOSIS — R259 Unspecified abnormal involuntary movements: Secondary | ICD-10-CM

## 2015-04-04 DIAGNOSIS — R258 Other abnormal involuntary movements: Secondary | ICD-10-CM

## 2015-04-04 DIAGNOSIS — F5101 Primary insomnia: Secondary | ICD-10-CM

## 2015-04-04 DIAGNOSIS — G252 Other specified forms of tremor: Secondary | ICD-10-CM

## 2015-04-04 MED ORDER — ATORVASTATIN CALCIUM 40 MG PO TABS: 40.0000 mg | ORAL_TABLET | Freq: Every day | ORAL | Status: DC

## 2015-04-04 NOTE — Patient Instructions (Signed)
General Instructions: I want you to see dr Jannifer Franklin back for follow up.  Please bring your medicines with you each time you come to clinic.  Medicines may include prescription medications, over-the-counter medications, herbal remedies, eye drops, vitamins, or other pills.   Progress Toward Treatment Goals:  No flowsheet data found.  Self Care Goals & Plans:  No flowsheet data found.  No flowsheet data found.   Care Management & Community Referrals:  No flowsheet data found.

## 2015-04-04 NOTE — Progress Notes (Signed)
[name] Subjective:   Patient ID: [name] male   DOB: 03-26-1941 74 y.o.   MRN: [id number]  HPI: [name] is a 74 y.o. male with a PMH detailed below who presents for hand tremor. He reports that over the past few months he has noticed a resting hand tremor.  He reports this has been worse in the right side than the left.  He notes that it is typically worse at night watching TV about an hour before dinner.  He notes the tremor resolves when he moves his hand.  He denies any trouble walking, or muscle rigidity, he denies any family history of tremors or parkinsonism. He has been seeing Dr Jannifer Franklin for mild cognitive impairment.  About 4 months ago he was started on aricept which he reports is working well and he feels he has had some improvement with his memory.  He notes that for the most part aricept has been well tolerated but that he has had increased nasal drainage. He also reports to me that he would like to start taking his cholesterol medication again.  He stopped this about a year ago due to fear it was causing his memory problems.    Past Medical History  Diagnosis Date  . Hyperlipidemia   . 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 GERD (gastroesophageal reflux disease)   . Colonic polyp     Tubulovillous adenomatous  . Tobacco abuse     quit 2001  . Gout 12/2007    inadequate aspiration of MTP joint left great toe  06/18/08; -fluid:  red, bloody, cell count not performed (TNP) and no crystals or organisms seen.  [name] COPD (chronic obstructive pulmonary disease) ([place])   . Insomnia   . PERFORATION, INTESTINE 06/19/2006    Annotation: During colonoscopy Qualifier: History of  By: Oretha Ellis    . Delirium, acute 2015    After anesthesia for colectomy - resolved in 48hrs   Current Outpatient Prescriptions  Medication Sig Dispense Refill  . ALPRAZolam (XANAX) 0.5 MG tablet Take 1 tablet (0.5 mg total) by mouth at bedtime as needed for anxiety. 30 tablet 3  .  aspirin 81 MG tablet Take 81 mg by mouth every evening.     [name] atorvastatin (LIPITOR) 40 MG tablet Take 1 tablet (40 mg total) by mouth daily. 90 tablet 3  . budesonide-formoterol (SYMBICORT) 160-4.5 MCG/ACT inhaler Inhale 2 puffs into the lungs 2 (two) times daily. 01/11/14- states uses PRN 3 Inhaler 1  . donepezil (ARICEPT) 10 MG tablet Take 1 tablet (10 mg total) by mouth at bedtime. 30 tablet 3  . Multiple Vitamin (MULTIVITAMIN) tablet Take 1 tablet by mouth daily.    . naproxen sodium (ANAPROX) 220 MG tablet Take 220 mg by mouth as needed.    . Omega 3 1000 MG CAPS Take 1 capsule by mouth daily.    . pantoprazole (PROTONIX) 40 MG tablet TAKE ONE TABLET BY MOUTH ONCE DAILY 30 tablet 2  . Polyethyl Glycol-Propyl Glycol (SYSTANE) 0.4-0.3 % SOLN Place 1 drop into both eyes 2 (two) times daily.    . tamsulosin (FLOMAX) 0.4 MG CAPS capsule TAKE ONE CAPSULE BY MOUTH ONCE DAILY 30 capsule 5   No current facility-administered medications for this visit.   Family History  Problem Relation Age of Onset  . Colon cancer Neg Hx   . Pancreatic cancer Neg Hx   . Stomach cancer Neg Hx   . Rectal cancer Neg Hx   .  Cerebral aneurysm Mother   . Cancer Father     lung cancer/asbestos   Social History   Social History  . Marital Status: Widowed    Spouse Name: N/A  . Number of Children: 1  . Years of Education: N/A   Occupational History  . Retired    Social History Main Topics  . Smoking status: Former Smoker    Types: Cigarettes    Quit date: 08/27/2000  . Smokeless tobacco: Never Used  . Alcohol Use: No     Comment: none x 7 years  . Drug Use: No  . Sexual Activity: Not Asked   Other Topics Concern  . None   Social History Narrative   Patient is right handed.   Patient drinks about 2-3 glasses of tea daily   Review of Systems: Review of Systems  Constitutional: Negative for fever, chills, weight loss and malaise/fatigue.  Respiratory: Negative for shortness of breath.    Cardiovascular: Negative for chest pain.  Gastrointestinal: Negative for diarrhea and constipation.  Neurological: Positive for tremors. Negative for sensory change.     Objective:  Physical Exam: Filed Vitals:   04/04/15 1356  BP: 114/60  Pulse: 71  Height: 5\' 6"  (1.676 m)  Weight: 137 lb 8 oz (62.37 kg)  SpO2: 98%  Physical Exam  Constitutional: He is well-developed, well-nourished, and in no distress.  Cardiovascular: Normal rate and regular rhythm.   Pulmonary/Chest: Effort normal and breath sounds normal.  Abdominal: Soft. Bowel sounds are normal.  Neurological: He is alert. Gait normal.  No muscular rigidity of upper extremities.  Psychiatric: Mood and memory normal.  Nursing note and vitals reviewed.   Assessment & Plan:  Case discussed with Dr. Beryle Beams  Mild cognitive impairment with memory loss He is seeing Dr Jannifer Franklin for his memory loss. Was started on aricpet.  His runny nose may be a side effect and he has an upcoming appointment with Dr Jannifer Franklin and will ask about changing to Exelon patch.  As far as his tremor goes I do not find any other signs of parkinsons but will continue to monitor.  Since he has a follow up appointment with Dr Jannifer Franklin in about 1-2 months he will bring this up at that time for further evaluation.  Resting tremor No Hx parkinson's, no muscle rigidity, no shuffling gait.  He does have some mild memory impairment.  He has a follow up with Neurology for his MCI in December and I have asked that he bring this up with Dr Jannifer Franklin at that time.  Primary insomnia Improved with Xanax 0.5mg  QHS PRN  Hyperlipidemia Last LDL 162 10 year ASCVD risk (based on age of 38) with last lipid panel is 8.6%.  He wishes to resume atorvastatin.  -Atorvastatin 40mg  daily    Medications Ordered Meds ordered this encounter  Medications  . atorvastatin (LIPITOR) 40 MG tablet    Sig: Take 1 tablet (40 mg total) by mouth daily.    Dispense:  90 tablet    Refill:   3   Other Orders Orders Placed This Encounter  Procedures  . Flu Vaccine QUAD 36+ mos IM   Follow Up: No Follow-up on file.

## 2015-04-04 NOTE — Assessment & Plan Note (Signed)
Last LDL 162 10 year ASCVD risk (based on age of 57) with last lipid panel is 8.6%.  He wishes to resume atorvastatin.  -Atorvastatin 40mg  daily

## 2015-04-04 NOTE — Progress Notes (Signed)
Medicine attending: Medical history, presenting problems, physical findings, and medications, reviewed with Dr Joni Reining and I concur with  His evaluation and management plan.

## 2015-04-04 NOTE — Assessment & Plan Note (Signed)
No Hx parkinson's, no muscle rigidity, no shuffling gait.  He does have some mild memory impairment.  He has a follow up with Neurology for his MCI in December and I have asked that he bring this up with Dr Jannifer Franklin at that time.

## 2015-04-04 NOTE — Assessment & Plan Note (Signed)
He is seeing Dr Jannifer Franklin for his memory loss. Was started on aricpet.  His runny nose may be a side effect and he has an upcoming appointment with Dr Jannifer Franklin and will ask about changing to Exelon patch.  As far as his tremor goes I do not find any other signs of parkinsons but will continue to monitor.  Since he has a follow up appointment with Dr Jannifer Franklin in about 1-2 months he will bring this up at that time for further evaluation.

## 2015-04-04 NOTE — Assessment & Plan Note (Signed)
Improved with Xanax 0.5mg  QHS PRN

## 2015-04-17 ENCOUNTER — Telehealth: Payer: Self-pay | Admitting: Neurology

## 2015-04-17 NOTE — Telephone Encounter (Signed)
I called the patient. He has developed a switch or a jerk and is arms associated with a certain arm position. If he repositions arm, the episodes go away. I will need to see what he is talking about, we will get a revisit set up.

## 2015-04-17 NOTE — Telephone Encounter (Signed)
Spoke to patient. Scheduled appt w/ Dr. Jannifer Franklin next week.

## 2015-04-17 NOTE — Telephone Encounter (Signed)
Patient called to make Dr. Jannifer Franklin aware that he has "developed a shake, hands/arms twitch and shake for approximately 3-4 weeks, it's not all the time, does it worse when his arm is in certain positions".

## 2015-04-24 ENCOUNTER — Encounter: Payer: Self-pay | Admitting: Neurology

## 2015-04-24 ENCOUNTER — Ambulatory Visit (INDEPENDENT_AMBULATORY_CARE_PROVIDER_SITE_OTHER): Payer: Medicare Other | Admitting: Neurology

## 2015-04-24 VITALS — BP 136/76 | HR 59 | Ht 66.0 in | Wt 137.0 lb

## 2015-04-24 DIAGNOSIS — G3184 Mild cognitive impairment, so stated: Secondary | ICD-10-CM | POA: Diagnosis not present

## 2015-04-24 DIAGNOSIS — R259 Unspecified abnormal involuntary movements: Principal | ICD-10-CM

## 2015-04-24 DIAGNOSIS — R258 Other abnormal involuntary movements: Secondary | ICD-10-CM | POA: Diagnosis not present

## 2015-04-24 DIAGNOSIS — F419 Anxiety disorder, unspecified: Secondary | ICD-10-CM | POA: Diagnosis not present

## 2015-04-24 DIAGNOSIS — G252 Other specified forms of tremor: Secondary | ICD-10-CM

## 2015-04-24 MED ORDER — DONEPEZIL HCL 10 MG PO TABS: 10.0000 mg | ORAL_TABLET | Freq: Every day | ORAL | Status: DC

## 2015-04-24 NOTE — Progress Notes (Signed)
Reason for visit: Tremor  [name] is an 74 y.o. male  History of present illness:  [name] is a 74 year old right-handed white male with a history of a minimum cognitive impairment issue. The patient has some anxiety issues, chronic insomnia. He takes low-dose Xanax at night with good improvement in his sleep. Over the last 6 weeks, he began noticing a resting tremor involving the right upper extremity. The patient believes if anything the tremor has improved slightly recently. The patient has no tremor involving the head or neck or legs. Occasionally he may note a slight tremor on the left arm. He has not noted any changes in walking, his handwriting has gotten slightly more sloppy. He has not had any change in his speech. He returns to this office for an evaluation. He remains on Aricept. Within the last month he has noted that he has had some drooling at nighttime.  Past Medical History  Diagnosis Date  . Hyperlipidemia   . 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 GERD (gastroesophageal reflux disease)   . Colonic polyp     Tubulovillous adenomatous  . Tobacco abuse     quit 2001  . Gout 12/2007    inadequate aspiration of MTP joint left great toe  06/18/08; -fluid:  red, bloody, cell count not performed (TNP) and no crystals or organisms seen.  [name] COPD (chronic obstructive pulmonary disease) ([place])   . Insomnia   . PERFORATION, INTESTINE 06/19/2006    Annotation: During colonoscopy Qualifier: History of  By: Oretha Ellis    . Delirium, acute 2015    After anesthesia for colectomy - resolved in 48hrs    Past Surgical History  Procedure Laterality Date  . Wrist surgery Left   . Colonoscopy    . Dupuytren contracture release Right   . Cataract extraction Bilateral   . Neck surgery  1970    removal cyst outside neck  . Laparoscopic partial colectomy Right 01/18/2014    Procedure: LAPAROSCOPIC PARTIAL RIGHT COLECTOMY;  Surgeon: Adin Hector, MD;  Location: [facility];  Service: General;   Laterality: Right;    Family History  Problem Relation Age of Onset  . Colon cancer Neg Hx   . Pancreatic cancer Neg Hx   . Stomach cancer Neg Hx   . Rectal cancer Neg Hx   . Cerebral aneurysm Mother   . Cancer Father     lung cancer/asbestos    Social history:  reports that he quit smoking about 14 years ago. His smoking use included Cigarettes. He has never used smokeless tobacco. He reports that he does not drink alcohol or use illicit drugs.    Allergies  Allergen Reactions  . Ambien [Zolpidem Tartrate] Other (See Comments)    paranoia    Medications:  Prior to Admission medications   Medication Sig Start Date End Date Taking? Authorizing Provider  ALPRAZolam Duanne Moron) 0.5 MG tablet Take 1 tablet (0.5 mg total) by mouth at bedtime as needed for anxiety. 03/04/15  Yes Kathrynn Ducking, MD  aspirin 81 MG tablet Take 81 mg by mouth every evening.    Yes Historical Provider, MD  atorvastatin (LIPITOR) 40 MG tablet Take 1 tablet (40 mg total) by mouth daily. 04/04/15 04/03/16 Yes Lucious Groves, DO  budesonide-formoterol (SYMBICORT) 160-4.5 MCG/ACT inhaler Inhale 2 puffs into the lungs 2 (two) times daily. 01/11/14- states uses PRN 01/12/14  Yes Lucious Groves, DO  donepezil (ARICEPT) 10 MG tablet Take 1 tablet (10 mg  total) by mouth at bedtime. 01/15/15  Yes Kathrynn Ducking, MD  Multiple Vitamin (MULTIVITAMIN) tablet Take 1 tablet by mouth daily.   Yes Historical Provider, MD  naproxen sodium (ANAPROX) 220 MG tablet Take 220 mg by mouth as needed.   Yes Historical Provider, MD  Omega 3 1000 MG CAPS Take 1 capsule by mouth daily.   Yes Historical Provider, MD  pantoprazole (PROTONIX) 40 MG tablet TAKE ONE TABLET BY MOUTH ONCE DAILY 02/12/15  Yes Lucious Groves, DO  Polyethyl Glycol-Propyl Glycol (SYSTANE) 0.4-0.3 % SOLN Place 1 drop into both eyes 2 (two) times daily.   Yes Historical Provider, MD  tamsulosin (FLOMAX) 0.4 MG CAPS capsule TAKE ONE CAPSULE BY MOUTH ONCE DAILY 01/14/15  Yes Lucious Groves, DO    ROS:  Out of a complete 14 system review of symptoms, the patient complains only of the following symptoms, and all other reviewed systems are negative.  Runny nose, drooling Light sensitivity Shortness of breath Insomnia Memory loss  Blood pressure 136/76, pulse 59, height 5\' 6"  (1.676 m), weight 137 lb (62.143 kg).  Physical Exam  General: The patient is alert and cooperative at the time of the examination.  Skin: No significant peripheral edema is noted.   Neurologic Exam  Mental status: The patient is alert and oriented x 3 at the time of the examination. The patient has apparent normal recent and remote memory, with an apparently normal attention span and concentration ability. Mini-Mental Status Examination done today shows a total score of 29/30. The patient is able to name 6 animals in 30 seconds.   Cranial nerves: Facial symmetry is present. Speech is normal, no aphasia or dysarthria is noted. Extraocular movements are full. Visual fields are full. Mild masking of the face is seen.  Motor: The patient has good strength in all 4 extremities.  Sensory examination: Soft touch sensation is symmetric on the face, arms, and legs.  Coordination: The patient has good finger-nose-finger and heel-to-shin bilaterally.  Gait and station: The patient has a normal gait. Tandem gait is normal. Romberg is negative. No drift is seen. With ambulation, there is decreased arm swing on the right arm, posture is very minimally stooped.  Reflexes: Deep tendon reflexes are symmetric.   Assessment/Plan:  1. Resting tremor, right upper extremity   2. Mild memory disturbance  The patient appears to have developed a resting tremor in the right arm primarily, associated with some drooling at night, and a mild change in handwriting. With ambulation, there is decreased arm swing on the right arm. The patient may be developing Parkinson's disease, he will need to be followed  for this issue. No medications are offered at this time. He will follow-up in January 2016 for his regular revisit. A prescription was written for the Aricept.  Jill Alexanders MD 04/24/2015 6:23 PM  [facility] [address] [place],  [address]  Phone [phone] Fax [phone]

## 2015-05-15 ENCOUNTER — Other Ambulatory Visit: Payer: Self-pay | Admitting: Internal Medicine

## 2015-06-05 ENCOUNTER — Other Ambulatory Visit: Payer: Self-pay | Admitting: Internal Medicine

## 2015-06-05 DIAGNOSIS — K222 Esophageal obstruction: Principal | ICD-10-CM

## 2015-06-05 DIAGNOSIS — K219 Gastro-esophageal reflux disease without esophagitis: Secondary | ICD-10-CM

## 2015-06-11 ENCOUNTER — Encounter: Payer: Self-pay | Admitting: Neurology

## 2015-06-11 ENCOUNTER — Ambulatory Visit (INDEPENDENT_AMBULATORY_CARE_PROVIDER_SITE_OTHER): Payer: Medicare Other | Admitting: Neurology

## 2015-06-11 VITALS — BP 123/72 | HR 61 | Ht 66.0 in | Wt 137.0 lb

## 2015-06-11 DIAGNOSIS — G3184 Mild cognitive impairment, so stated: Secondary | ICD-10-CM | POA: Diagnosis not present

## 2015-06-11 DIAGNOSIS — R258 Other abnormal involuntary movements: Secondary | ICD-10-CM | POA: Diagnosis not present

## 2015-06-11 DIAGNOSIS — R259 Unspecified abnormal involuntary movements: Principal | ICD-10-CM

## 2015-06-11 DIAGNOSIS — G252 Other specified forms of tremor: Secondary | ICD-10-CM

## 2015-06-11 NOTE — Progress Notes (Signed)
Reason for visit: Memory disturbance  [name] is an 75 y.o. male  History of present illness:  [name] is a 75 year old right-handed white male with a history of a mild memory disturbance. The patient is on Aricept taking 10 mg at night, he is tolerating the medication well. He does report a runny nose on the medication. He has developed a tremor involving the right arm more recently, this has not progressed. The patient has been noted to have decreased arm swing on the right, but he reports that he has bursitis of the right shoulder, and does not move the shoulder because of discomfort. He denies any swallowing problems or falls. He denies any speech changes or swallowing problems. He returns to the office today for an evaluation.  Past Medical History  Diagnosis Date  . Hyperlipidemia   . 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 GERD (gastroesophageal reflux disease)   . Colonic polyp     Tubulovillous adenomatous  . Tobacco abuse     quit 2001  . Gout 12/2007    inadequate aspiration of MTP joint left great toe  06/18/08; -fluid:  red, bloody, cell count not performed (TNP) and no crystals or organisms seen.  [name] COPD (chronic obstructive pulmonary disease) ([place])   . Insomnia   . PERFORATION, INTESTINE 06/19/2006    Annotation: During colonoscopy Qualifier: History of  By: Oretha Ellis    . Delirium, acute 2015    After anesthesia for colectomy - resolved in 48hrs    Past Surgical History  Procedure Laterality Date  . Wrist surgery Left   . Colonoscopy    . Dupuytren contracture release Right   . Cataract extraction Bilateral   . Neck surgery  1970    removal cyst outside neck  . Laparoscopic partial colectomy Right 01/18/2014    Procedure: LAPAROSCOPIC PARTIAL RIGHT COLECTOMY;  Surgeon: Adin Hector, MD;  Location: [facility];  Service: General;  Laterality: Right;    Family History  Problem Relation Age of Onset  . Colon cancer Neg Hx   . Pancreatic cancer Neg Hx   . Stomach  cancer Neg Hx   . Rectal cancer Neg Hx   . Cerebral aneurysm Mother   . Cancer Father     lung cancer/asbestos    Social history:  reports that he quit smoking about 14 years ago. His smoking use included Cigarettes. He has never used smokeless tobacco. He reports that he does not drink alcohol or use illicit drugs.    Allergies  Allergen Reactions  . Ambien [Zolpidem Tartrate] Other (See Comments)    paranoia    Medications:  Prior to Admission medications   Medication Sig Start Date End Date Taking? Authorizing Provider  ALPRAZolam Duanne Moron) 0.5 MG tablet Take 1 tablet (0.5 mg total) by mouth at bedtime as needed for anxiety. 03/04/15  Yes Kathrynn Ducking, MD  aspirin 81 MG tablet Take 81 mg by mouth every evening.    Yes Historical Provider, MD  atorvastatin (LIPITOR) 40 MG tablet Take 1 tablet (40 mg total) by mouth daily. 04/04/15 04/03/16 Yes Lucious Groves, DO  donepezil (ARICEPT) 10 MG tablet Take 1 tablet (10 mg total) by mouth at bedtime. 04/24/15  Yes Kathrynn Ducking, MD  Multiple Vitamin (MULTIVITAMIN) tablet Take 1 tablet by mouth daily.   Yes Historical Provider, MD  naproxen sodium (ANAPROX) 220 MG tablet Take 220 mg by mouth as needed.   Yes Historical Provider, MD  Ernestine Conrad  3 1000 MG CAPS Take 1 capsule by mouth daily.   Yes Historical Provider, MD  pantoprazole (PROTONIX) 40 MG tablet Take 1 tablet (40 mg total) by mouth daily. 06/06/15  Yes Oval Linsey, MD  Polyethyl Glycol-Propyl Glycol (SYSTANE) 0.4-0.3 % SOLN Place 1 drop into both eyes 2 (two) times daily.   Yes Historical Provider, MD  tamsulosin (FLOMAX) 0.4 MG CAPS capsule TAKE ONE CAPSULE BY MOUTH ONCE DAILY 01/14/15  Yes Lucious Groves, DO    ROS:  Out of a complete 14 system review of symptoms, the patient complains only of the following symptoms, and all other reviewed systems are negative.  Runny nose Eye discharge, light sensitivity Shortness of breath Insomnia Bursitis of the right shoulder Memory  loss, tremors Anxiety  Blood pressure 123/72, pulse 61, height 5\' 6"  (1.676 m), weight 137 lb (62.143 kg).  Physical Exam  General: The patient is alert and cooperative at the time of the examination.  Skin: No significant peripheral edema is noted.   Neurologic Exam  Mental status: The patient is alert and oriented x 3 at the time of the examination. The patient has apparent normal recent and remote memory, with an apparently normal attention span and concentration ability. Mini-Mental Status Examination done today shows a total score of 30/30. The patient is able to name 7 animals in 30 seconds.   Cranial nerves: Facial symmetry is present. Speech is normal, no aphasia or dysarthria is noted. Extraocular movements are full. Visual fields are full.  Motor: The patient has good strength in all 4 extremities.  Sensory examination: Soft touch sensation is symmetric on the face, arms, and legs.  Coordination: The patient has good finger-nose-finger and heel-to-shin bilaterally.  Gait and station: The patient has a normal gait. There is decreased arm swing on the right with walking. Tandem gait is normal. Romberg is negative. No drift is seen.  Reflexes: Deep tendon reflexes are symmetric.   Assessment/Plan:  1. Right upper extremity tremor  2. Mild memory disturbance  The patient will continue the Aricept. The patient indicates that he is interested in clinical research, I will make referral in this regard for the memory issues. The patient will follow-up in 6 months. He will contact me if new issues arise.  Jill Alexanders MD 06/11/2015 8:54 PM  [facility] [address] [place], [place] [address]  Phone [phone] Fax [phone]

## 2015-06-13 DIAGNOSIS — H04123 Dry eye syndrome of bilateral lacrimal glands: Secondary | ICD-10-CM | POA: Diagnosis not present

## 2015-06-13 DIAGNOSIS — Z961 Presence of intraocular lens: Secondary | ICD-10-CM | POA: Diagnosis not present

## 2015-07-04 ENCOUNTER — Telehealth: Payer: Self-pay

## 2015-07-04 NOTE — Telephone Encounter (Signed)
I spoke to the patient in re the CREAD study. Patient seems to be very interested in participating. I told him that I will mail him the Informed Consent Form and that I will call him back in a week or two. Patient voiced understanding and appreciation.

## 2015-07-08 ENCOUNTER — Ambulatory Visit (INDEPENDENT_AMBULATORY_CARE_PROVIDER_SITE_OTHER): Payer: Medicare Other | Admitting: Internal Medicine

## 2015-07-08 ENCOUNTER — Encounter: Payer: Self-pay | Admitting: Internal Medicine

## 2015-07-08 VITALS — BP 116/74 | HR 62 | Temp 97.7°F | Ht 66.0 in | Wt 136.1 lb

## 2015-07-08 DIAGNOSIS — F411 Generalized anxiety disorder: Secondary | ICD-10-CM | POA: Diagnosis not present

## 2015-07-08 DIAGNOSIS — R200 Anesthesia of skin: Secondary | ICD-10-CM | POA: Diagnosis not present

## 2015-07-08 DIAGNOSIS — R202 Paresthesia of skin: Secondary | ICD-10-CM

## 2015-07-08 DIAGNOSIS — G3184 Mild cognitive impairment, so stated: Secondary | ICD-10-CM | POA: Diagnosis not present

## 2015-07-08 DIAGNOSIS — E785 Hyperlipidemia, unspecified: Secondary | ICD-10-CM | POA: Diagnosis present

## 2015-07-08 DIAGNOSIS — F419 Anxiety disorder, unspecified: Secondary | ICD-10-CM

## 2015-07-08 NOTE — Patient Instructions (Signed)
General Instructions:   Please bring your medicines with you each time you come to clinic.  Medicines may include prescription medications, over-the-counter medications, herbal remedies, eye drops, vitamins, or other pills.   Progress Toward Treatment Goals:  No flowsheet data found.  Self Care Goals & Plans:  No flowsheet data found.  No flowsheet data found.   Care Management & Community Referrals:  No flowsheet data found.      

## 2015-07-08 NOTE — Progress Notes (Signed)
[facility] Subjective:   Patient ID: [name] male   DOB: 1941-05-30 74 y.o.   MRN: [id number]  HPI: [name] is a 75 y.o. male with a PMH detailed below who presents for 6 month follow up and with concerns about weight loss and cramping in his toes.  A friend recently was concerned that he may be lossing weight.  If feels he may have lost a few pounds but he has not been overly concerned.  His appetite is normal and he has not had any major change in his health.    He also notes that 2 nights ago he experienced transient numbness and tingling in the tips of his left 2nd and 4th toe, this went way after a few minutes.  He has never experienced this before.  He has had no other numbness, he denies any muscle weakness.  Wt Readings from Last 5 Encounters:  07/08/15 136 lb 1.6 oz (61.735 kg)  06/11/15 137 lb (62.143 kg)  04/24/15 137 lb (62.143 kg)  04/04/15 137 lb 8 oz (62.37 kg)  12/04/14 137 lb 6.4 oz (62.324 kg)     Past Medical History  Diagnosis Date  . Hyperlipidemia   . 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 GERD (gastroesophageal reflux disease)   . Colonic polyp     Tubulovillous adenomatous  . Tobacco abuse     quit 2001  . Gout 12/2007    inadequate aspiration of MTP joint left great toe  06/18/08; -fluid:  red, bloody, cell count not performed (TNP) and no crystals or organisms seen.  [name] COPD (chronic obstructive pulmonary disease) ([place])   . Insomnia   . PERFORATION, INTESTINE 06/19/2006    Annotation: During colonoscopy Qualifier: History of  By: Oretha Ellis    . Delirium, acute 2015    After anesthesia for colectomy - resolved in 48hrs   Current Outpatient Prescriptions  Medication Sig Dispense Refill  . ALPRAZolam (XANAX) 0.5 MG tablet Take 1 tablet (0.5 mg total) by mouth at bedtime as needed for anxiety. 30 tablet 3  . aspirin 81 MG tablet Take 81 mg by mouth every evening.     [name] atorvastatin (LIPITOR) 40 MG tablet Take 1 tablet (40 mg  total) by mouth daily. 90 tablet 3  . donepezil (ARICEPT) 10 MG tablet Take 1 tablet (10 mg total) by mouth at bedtime. 30 tablet 5  . Multiple Vitamin (MULTIVITAMIN) tablet Take 1 tablet by mouth daily.    . naproxen sodium (ANAPROX) 220 MG tablet Take 220 mg by mouth as needed.    . Omega 3 1000 MG CAPS Take 1 capsule by mouth daily.    . pantoprazole (PROTONIX) 40 MG tablet Take 1 tablet (40 mg total) by mouth daily. 90 tablet 3  . Polyethyl Glycol-Propyl Glycol (SYSTANE) 0.4-0.3 % SOLN Place 1 drop into both eyes 2 (two) times daily.    . tamsulosin (FLOMAX) 0.4 MG CAPS capsule TAKE ONE CAPSULE BY MOUTH ONCE DAILY 30 capsule 5   No current facility-administered medications for this visit.   Family History  Problem Relation Age of Onset  . Colon cancer Neg Hx   . Pancreatic cancer Neg Hx   . Stomach cancer Neg Hx   . Rectal cancer Neg Hx   . Cerebral aneurysm Mother   . Cancer Father     lung cancer/asbestos   Social History   Social History  . Marital Status: Widowed    Spouse Name: N/A  .  Number of Children: 1  . Years of Education: 13   Occupational History  . Retired    Social History Main Topics  . Smoking status: Former Smoker    Types: Cigarettes    Quit date: 08/27/2000  . Smokeless tobacco: Never Used  . Alcohol Use: No     Comment: none x 7 years  . Drug Use: No  . Sexual Activity: Not Asked   Other Topics Concern  . None   Social History Narrative   Patient is right handed.   Patient drinks about 2-3 glasses of tea daily   Review of Systems: Review of Systems  Constitutional: Negative for fever and chills.  Eyes: Negative for blurred vision.  Respiratory: Negative for cough and shortness of breath.   Cardiovascular: Negative for chest pain.  Gastrointestinal: Negative for abdominal pain.  Genitourinary: Negative for dysuria.  Neurological: Negative for weakness.  All other systems reviewed and are negative.    Objective:  Physical  Exam: Filed Vitals:   07/08/15 1344  BP: 116/74  Pulse: 62  Temp: 97.7 F (36.5 C)  TempSrc: Oral  Height: 5\' 6"  (1.676 m)  Weight: 136 lb 1.6 oz (61.735 kg)  SpO2: 98%  Physical Exam  Constitutional: He is oriented to person, place, and time and well-developed, well-nourished, and in no distress.  Cardiovascular: Normal rate and regular rhythm.   Pulmonary/Chest: Effort normal and breath sounds normal.  Abdominal: Soft. Bowel sounds are normal.  Musculoskeletal:  Mild resting tremor in hands right >left  Neurological: He is alert and oriented to person, place, and time. Gait normal.  Sensation of bilateral lower extremities intact, patellar and achillis reflexes 2+ bilaterally  Nursing note and vitals reviewed.   Assessment & Plan:  Case discussed with Dr. Eppie Gibson  Mild cognitive impairment with memory loss HPI: He has been following with Dr Jannifer Franklin, he feels he is doing very well on aricept and considering admission into a Alz disease study.  A: Mild cognitive impairment  P: -Continue aricept   Hyperlipidemia HPI: He resumed taking his atorvastatin a few months ago and would like his cholesterol rechecked to see if he is at goal  A: HLD  P: Repeat Lipid panel (return for fasting per patient request) -Continue atorvastatin 40mg  daily  Anxiety disorder HPI: doing very well lately, still taking Xanax as needed.  A; Generalized anxiety disorder  P: continue low dose Xanax as needed.  Numbness and tingling of foot A; Paresthesia of right foot  P: Given the mild nature of the episode, I am not too concerned about this at this time.  I did discuss that we did a workup about a year ago for his mild cognitive impairment which was normal.  We agreed on a plan of watching and waiting to see if further symptoms appear. -Continue MVI    Medications Ordered Meds ordered this encounter  Medications  . albuterol (PROVENTIL HFA;VENTOLIN HFA) 108 (90 Base) MCG/ACT inhaler     Sig: Inhale 1-2 puffs into the lungs every 6 (six) hours as needed for wheezing or shortness of breath.   Other Orders Orders Placed This Encounter  Procedures  . BMP8+Anion Gap    Standing Status: Future     Number of Occurrences: 1     Standing Expiration Date: 08/06/2015    Order Specific Question:  Has the patient fasted?    Answer:  Yes  . Lipid Profile    Standing Status: Future     Number of Occurrences:  1     Standing Expiration Date: 08/05/2016    Order Specific Question:  Has the patient fasted?    Answer:  Yes   Follow Up: Return in about 6 months (around 01/05/2016).

## 2015-07-09 ENCOUNTER — Other Ambulatory Visit: Payer: Self-pay | Admitting: Neurology

## 2015-07-09 ENCOUNTER — Telehealth: Payer: Self-pay

## 2015-07-09 NOTE — Telephone Encounter (Signed)
Patient is calling in regards to the CREAD study. The patient requested the Informed Consent Form to be emailed to her daughter [name] at [email or web address], so they can make a decision on whether they would like to participate. He also asked to be called back within a week.

## 2015-07-10 ENCOUNTER — Other Ambulatory Visit (INDEPENDENT_AMBULATORY_CARE_PROVIDER_SITE_OTHER): Payer: Medicare Other

## 2015-07-10 DIAGNOSIS — E785 Hyperlipidemia, unspecified: Secondary | ICD-10-CM

## 2015-07-10 DIAGNOSIS — G3184 Mild cognitive impairment, so stated: Secondary | ICD-10-CM | POA: Diagnosis not present

## 2015-07-10 DIAGNOSIS — R200 Anesthesia of skin: Secondary | ICD-10-CM | POA: Insufficient documentation

## 2015-07-10 DIAGNOSIS — R202 Paresthesia of skin: Secondary | ICD-10-CM

## 2015-07-10 NOTE — Assessment & Plan Note (Addendum)
A; Paresthesia of right foot  P: Given the mild nature of the episode, I am not too concerned about this at this time.  I did discuss that we did a workup about a year ago for his mild cognitive impairment which was normal.  We agreed on a plan of watching and waiting to see if further symptoms appear. -Continue MVI

## 2015-07-10 NOTE — Assessment & Plan Note (Signed)
HPI: doing very well lately, still taking Xanax as needed.  A; Generalized anxiety disorder  P: continue low dose Xanax as needed.

## 2015-07-10 NOTE — Assessment & Plan Note (Signed)
HPI: He has been following with Dr Jannifer Franklin, he feels he is doing very well on aricept and considering admission into a Alz disease study.  A: Mild cognitive impairment  P: -Continue aricept

## 2015-07-10 NOTE — Assessment & Plan Note (Signed)
HPI: He resumed taking his atorvastatin a few months ago and would like his cholesterol rechecked to see if he is at goal  A: HLD  P: Repeat Lipid panel (return for fasting per patient request) -Continue atorvastatin 40mg  daily

## 2015-07-11 ENCOUNTER — Encounter: Payer: Self-pay | Admitting: Internal Medicine

## 2015-07-11 LAB — BMP8+ANION GAP
Anion Gap: 19 mmol/L — ABNORMAL HIGH (ref 10.0–18.0)
BUN/Creatinine Ratio: 18 (ref 10–22)
BUN: 22 mg/dL (ref 8–27)
CALCIUM: 10 mg/dL (ref 8.6–10.2)
CHLORIDE: 99 mmol/L (ref 96–106)
CO2: 24 mmol/L (ref 18–29)
CREATININE: 1.2 mg/dL (ref 0.76–1.27)
GFR calc Af Amer: 69 mL/min/{1.73_m2} (ref >59)
GFR calc non Af Amer: 60 mL/min/{1.73_m2} (ref >59)
GLUCOSE: 90 mg/dL (ref 65–99)
Potassium: 4.4 mmol/L (ref 3.5–5.2)
Sodium: 142 mmol/L (ref 134–144)

## 2015-07-11 LAB — LIPID PANEL
CHOLESTEROL TOTAL: 128 mg/dL (ref 100–199)
Chol/HDL Ratio: 2.5 ratio units (ref 0.0–5.0)
HDL: 52 mg/dL (ref >39)
LDL CALC: 58 mg/dL (ref 0–99)
Triglycerides: 88 mg/dL (ref 0–149)
VLDL CHOLESTEROL CAL: 18 mg/dL (ref 5–40)

## 2015-07-11 NOTE — Progress Notes (Signed)
Case discussed with Dr. Hoffman soon after the resident saw the patient.  We reviewed the resident's history and exam and pertinent patient test results.  I agree with the assessment, diagnosis and plan of care documented in the resident's note. 

## 2015-07-16 ENCOUNTER — Telehealth: Payer: Self-pay

## 2015-07-16 NOTE — Telephone Encounter (Signed)
I spoke to the patient in regards to the CREAD study. I answered all of the patient's questions about the study. The patient stated that he will discuss it with his family and give [place] a call back.

## 2015-07-17 ENCOUNTER — Other Ambulatory Visit: Payer: Self-pay | Admitting: Internal Medicine

## 2015-07-19 ENCOUNTER — Telehealth: Payer: Self-pay

## 2015-07-19 NOTE — Telephone Encounter (Signed)
I left a message for the patient to return my call.

## 2015-07-22 ENCOUNTER — Telehealth: Payer: Self-pay

## 2015-07-22 NOTE — Telephone Encounter (Signed)
I spoke to the patient in regards to the CREAD study. The patient is interested in taking the memory screening test. The appointment has been scheduled for [id number] at 11:00h.

## 2015-07-25 ENCOUNTER — Telehealth: Payer: Self-pay | Admitting: Neurology

## 2015-07-25 NOTE — Telephone Encounter (Signed)
I spoke with [name].  I called and asked the patient if he would be able to move his Research appointment to 10:00 am for tomorrow, 07/25/2015.  The patient said that he could come in at 10:00 am.  I thanked him for being able to accommodate this request.

## 2015-07-26 ENCOUNTER — Encounter (INDEPENDENT_AMBULATORY_CARE_PROVIDER_SITE_OTHER): Payer: Self-pay

## 2015-07-26 DIAGNOSIS — Z0289 Encounter for other administrative examinations: Secondary | ICD-10-CM

## 2015-08-23 DIAGNOSIS — L299 Pruritus, unspecified: Secondary | ICD-10-CM | POA: Diagnosis not present

## 2015-09-10 IMAGING — CR DG CHEST 2V
2 series · 2 of 2 positions shown · non-contrast
Comparison: 11/28/2009, 10/10/2009

CLINICAL DATA: Preoperative evaluation for laparoscopic colectomy,
smoker, COPD

EXAM:
CHEST  2 VIEW

[w chest pa *]
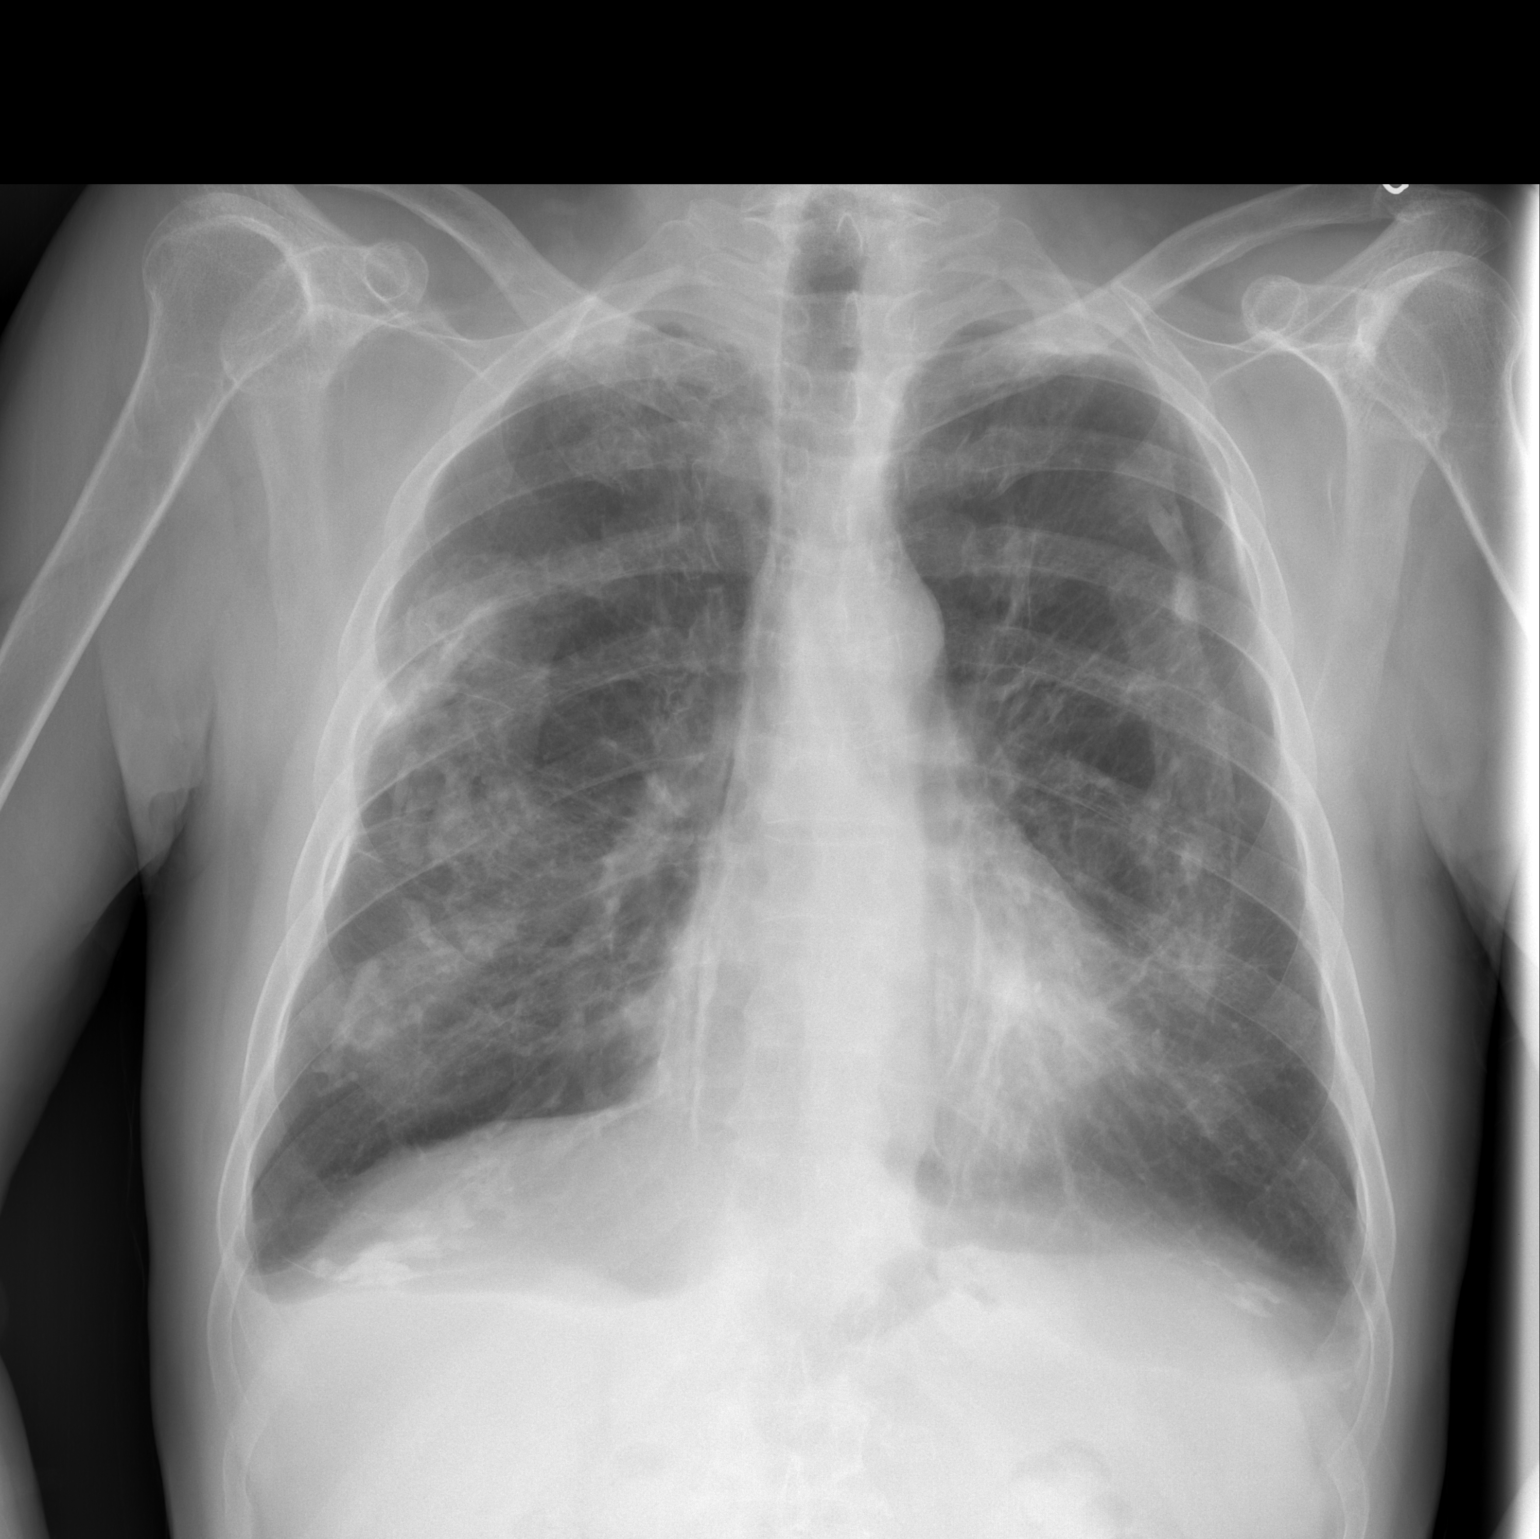

[w chest lat]
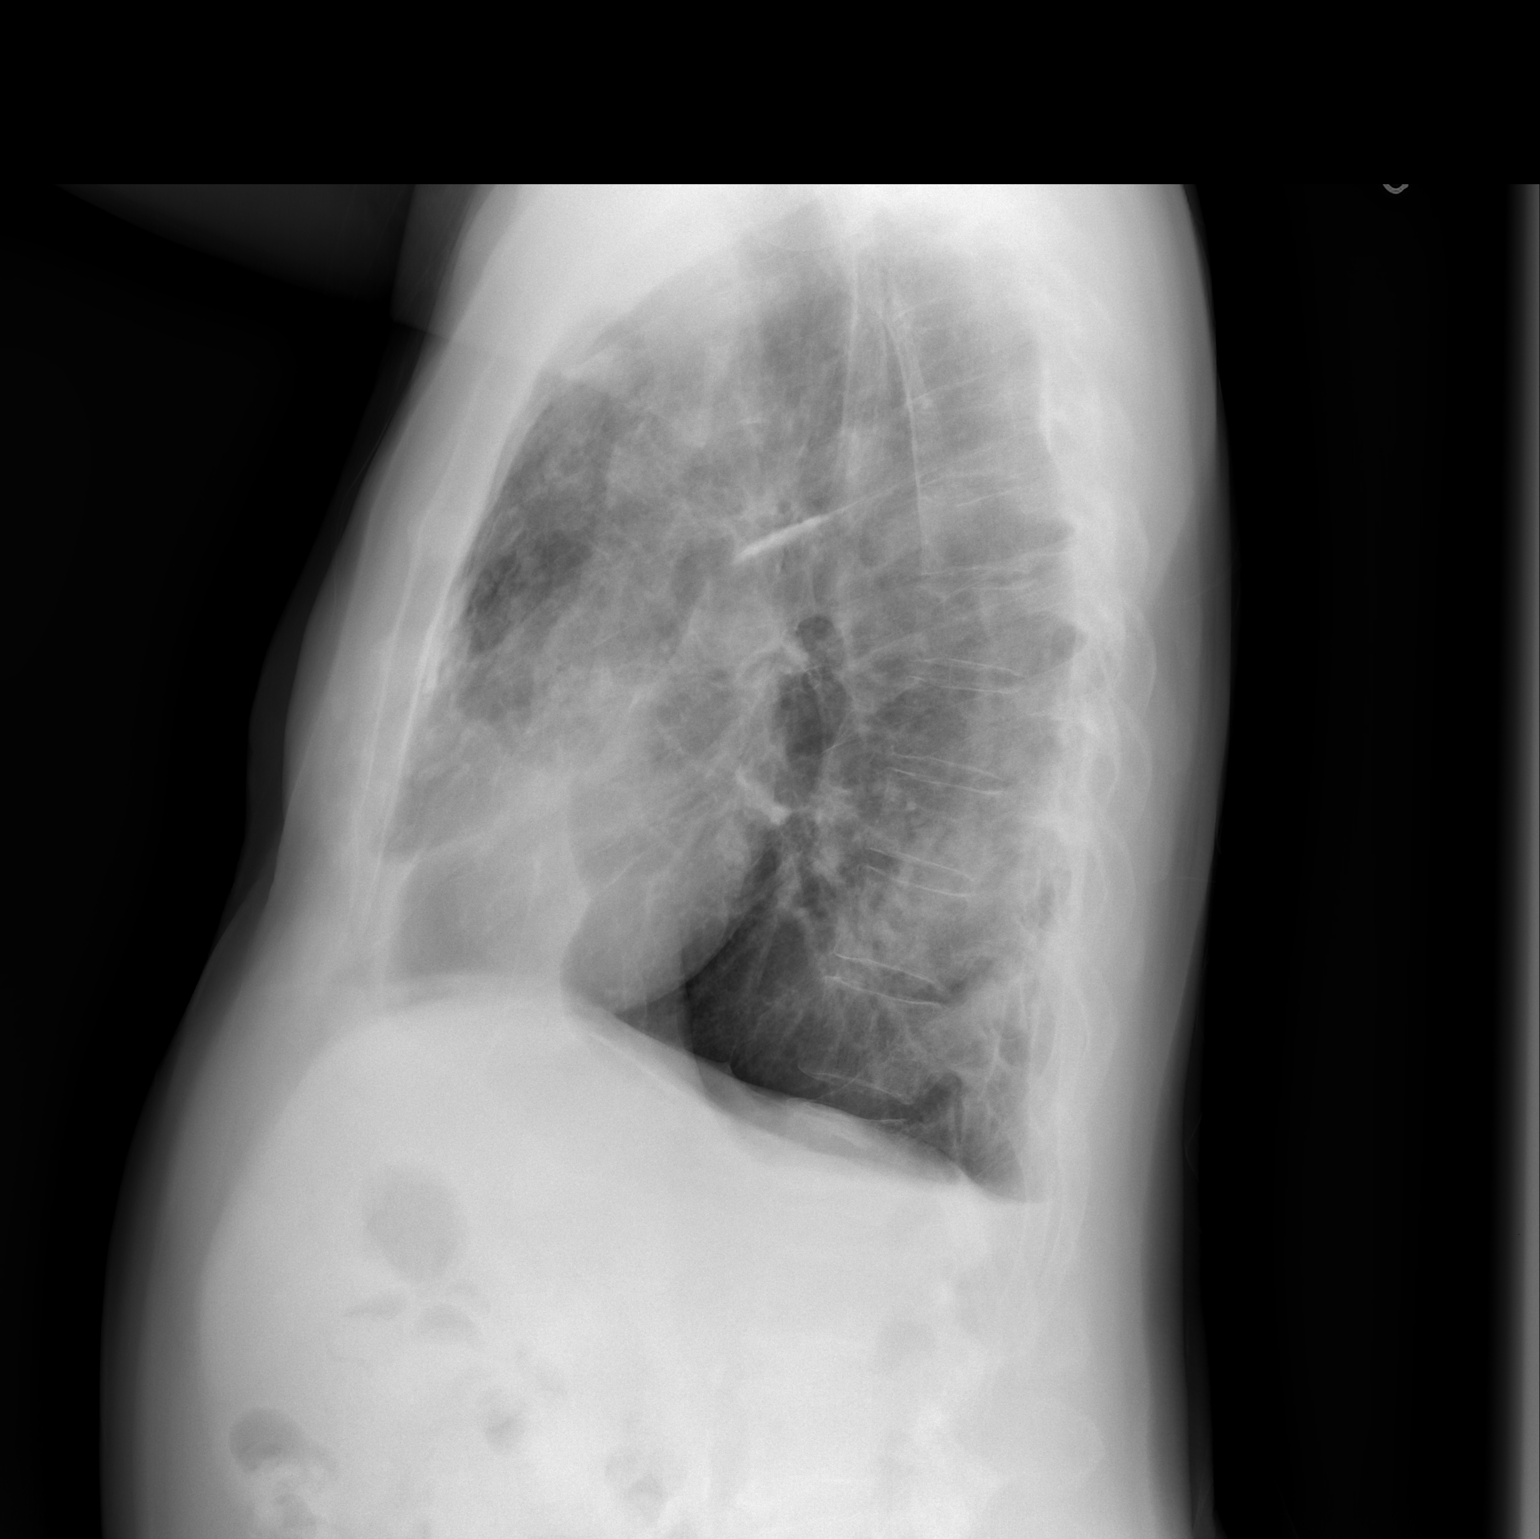

[2 of 2 positions shown; findings below may reference images not displayed]

FINDINGS: Extensive bilateral calcified pleural plaques compatible with
asbestos related pleural disease. Chronic background parenchymal
scarring and apical thickening. Background emphysema suspected. No
definite superimposed pneumonia, collapse, consolidation, or edema.
No effusion or pneumothorax. Trachea midline.
IMPRESSION: Extensive bilateral calcified pleural plaques.

Stable parenchymal scarring and COPD.

No acute superimposed process

## 2015-10-15 DIAGNOSIS — H532 Diplopia: Secondary | ICD-10-CM | POA: Diagnosis not present

## 2015-10-15 DIAGNOSIS — H40013 Open angle with borderline findings, low risk, bilateral: Secondary | ICD-10-CM | POA: Diagnosis not present

## 2015-10-15 DIAGNOSIS — Z961 Presence of intraocular lens: Secondary | ICD-10-CM | POA: Diagnosis not present

## 2015-10-16 ENCOUNTER — Telehealth: Payer: Self-pay | Admitting: Neurology

## 2015-10-16 NOTE — Telephone Encounter (Signed)
Patient called, states when he last saw Dr. Jannifer Franklin, he was asked if he had any drooling, at that time he did not, wants to make Dr. Jannifer Franklin aware that he does have drooling now.

## 2015-10-16 NOTE — Telephone Encounter (Signed)
I called the patient. The patient has noted some drooling over the last couple weeks. He has been noted to have decreased arm swing on the right, need to follow him for parkinsonism. Indicates that his tremors have actually improved recently. He is on Aricept which may promote the drooling as well.

## 2015-11-05 DIAGNOSIS — H5034 Intermittent alternating exotropia: Secondary | ICD-10-CM | POA: Diagnosis not present

## 2015-11-05 DIAGNOSIS — H538 Other visual disturbances: Secondary | ICD-10-CM | POA: Diagnosis not present

## 2015-11-05 DIAGNOSIS — H532 Diplopia: Secondary | ICD-10-CM | POA: Diagnosis not present

## 2015-11-05 DIAGNOSIS — Z961 Presence of intraocular lens: Secondary | ICD-10-CM | POA: Diagnosis not present

## 2015-11-09 DIAGNOSIS — M5441 Lumbago with sciatica, right side: Secondary | ICD-10-CM | POA: Diagnosis not present

## 2015-11-13 ENCOUNTER — Other Ambulatory Visit: Payer: Self-pay | Admitting: Neurology

## 2015-11-28 ENCOUNTER — Encounter: Payer: Self-pay | Admitting: Internal Medicine

## 2015-11-28 ENCOUNTER — Ambulatory Visit (INDEPENDENT_AMBULATORY_CARE_PROVIDER_SITE_OTHER): Payer: Medicare Other | Admitting: Internal Medicine

## 2015-11-28 VITALS — BP 127/55 | HR 64 | Temp 97.8°F | Ht 66.0 in | Wt 134.3 lb

## 2015-11-28 DIAGNOSIS — G3184 Mild cognitive impairment, so stated: Secondary | ICD-10-CM

## 2015-11-28 DIAGNOSIS — R258 Other abnormal involuntary movements: Secondary | ICD-10-CM

## 2015-11-28 DIAGNOSIS — N529 Male erectile dysfunction, unspecified: Secondary | ICD-10-CM | POA: Insufficient documentation

## 2015-11-28 DIAGNOSIS — R399 Unspecified symptoms and signs involving the genitourinary system: Secondary | ICD-10-CM

## 2015-11-28 DIAGNOSIS — G252 Other specified forms of tremor: Secondary | ICD-10-CM

## 2015-11-28 DIAGNOSIS — R259 Unspecified abnormal involuntary movements: Secondary | ICD-10-CM

## 2015-11-28 MED ORDER — TAMSULOSIN HCL 0.4 MG PO CAPS: 0.4000 mg | ORAL_CAPSULE | Freq: Every day | ORAL | Status: DC

## 2015-11-28 MED ORDER — SILDENAFIL CITRATE 20 MG PO TABS: 40.0000 mg | ORAL_TABLET | ORAL | Status: DC | PRN

## 2015-11-28 NOTE — Assessment & Plan Note (Signed)
-  Improved with flomax, continue 0.4mg  daily

## 2015-11-28 NOTE — Assessment & Plan Note (Signed)
Stable Continue aricept, if drooling/ runny nose becomes more of a problem may consider exelon patch

## 2015-11-28 NOTE — Progress Notes (Signed)
[facility] Subjective:   Patient ID: [name] male   DOB: 1940-08-13 75 y.o.   MRN: [id number]  HPI: [name] is a 75 y.o. male with a PMH detailed below who presents for follow up of HTN.  He reports he is doing well with his blood pressure medications and has no issues  He did start having some back pain, he went to see Dr Melvyn Novas (Ortho) and was prescribed a course of prednisone, this didn't help much and now he is scheduled for an MRI of his back next week.  HE reports the pain is typically worse with sitting for long period.  He has had no trauma to the area and has no fever or chills, no leg weakness or numbness.  He also recently saw the dermatologist for a check up and noticed some itchy skin, he was given a prescription for triamcinolone cream which has helped some.  He feels his memory has stayed stable, he is taking Aricept, he does have some runny nose and drooling but does not think it is bad enough to stop.  He sees dr Jannifer Franklin next month  He is still having a mild tremor of his right arm but is not very bothersome at this time.  He is also reports that he is sexually active with one male partner and occasionally has trouble establishing and maintaining erections he would like a prescription for viagra.  Past Medical History  Diagnosis Date  . Hyperlipidemia   . 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 GERD (gastroesophageal reflux disease)   . Colonic polyp     Tubulovillous adenomatous  . Tobacco abuse     quit 2001  . Gout 12/2007    inadequate aspiration of MTP joint left great toe  06/18/08; -fluid:  red, bloody, cell count not performed (TNP) and no crystals or organisms seen.  [name] COPD (chronic obstructive pulmonary disease) ([place])   . Insomnia   . PERFORATION, INTESTINE 06/19/2006    Annotation: During colonoscopy Qualifier: History of  By: Oretha Ellis    . Delirium, acute 2015    After anesthesia for colectomy - resolved in 48hrs   Current  Outpatient Prescriptions  Medication Sig Dispense Refill  . albuterol (PROVENTIL HFA;VENTOLIN HFA) 108 (90 Base) MCG/ACT inhaler Inhale 1-2 puffs into the lungs every 6 (six) hours as needed for wheezing or shortness of breath.    . ALPRAZolam (XANAX) 0.5 MG tablet TAKE ONE TABLET BY MOUTH AT BEDTIME AS NEEDED FOR ANXIETY 30 tablet 3  . aspirin 81 MG tablet Take 81 mg by mouth every evening.     [name] atorvastatin (LIPITOR) 40 MG tablet Take 1 tablet (40 mg total) by mouth daily. 90 tablet 3  . donepezil (ARICEPT) 10 MG tablet TAKE ONE TABLET BY MOUTH AT BEDTIME 30 tablet 0  . Multiple Vitamin (MULTIVITAMIN) tablet Take 1 tablet by mouth daily.    . naproxen sodium (ANAPROX) 220 MG tablet Take 220 mg by mouth as needed.    . Omega 3 1000 MG CAPS Take 1 capsule by mouth daily.    . pantoprazole (PROTONIX) 40 MG tablet Take 1 tablet (40 mg total) by mouth daily. 90 tablet 3  . Polyethyl Glycol-Propyl Glycol (SYSTANE) 0.4-0.3 % SOLN Place 1 drop into both eyes 2 (two) times daily.    . tamsulosin (FLOMAX) 0.4 MG CAPS capsule TAKE ONE CAPSULE BY MOUTH ONCE DAILY 30 capsule 5   No current facility-administered medications for this visit.  Family History  Problem Relation Age of Onset  . Colon cancer Neg Hx   . Pancreatic cancer Neg Hx   . Stomach cancer Neg Hx   . Rectal cancer Neg Hx   . Cerebral aneurysm Mother   . Cancer Father     lung cancer/asbestos   Social History   Social History  . Marital Status: Widowed    Spouse Name: N/A  . Number of Children: 1  . Years of Education: 13   Occupational History  . Retired    Social History Main Topics  . Smoking status: Former Smoker    Types: Cigarettes    Quit date: 08/27/2000  . Smokeless tobacco: Never Used  . Alcohol Use: No     Comment: none x 7 years  . Drug Use: No  . Sexual Activity: Not on file   Other Topics Concern  . Not on file   Social History Narrative   Patient is right handed.   Patient drinks about 2-3  glasses of tea daily   Review of Systems: Per HPI  Objective:  Physical Exam: Filed Vitals:   11/28/15 1506 11/28/15 1507  BP: 127/55   Pulse: 64   Temp:  97.8 F (36.6 C)  TempSrc:  Oral  Height: 5\' 6"  (1.676 m)   Weight: 134 lb 4.8 oz (60.918 kg)   SpO2: 98%    Physical Exam  Constitutional: He is oriented to person, place, and time and well-developed, well-nourished, and in no distress.  HENT:  Head: Normocephalic and atraumatic.  Cardiovascular: Normal rate, regular rhythm and normal heart sounds.   Pulmonary/Chest: Effort normal and breath sounds normal. No respiratory distress. He has no wheezes.  Musculoskeletal:       Lumbar back: He exhibits tenderness. He exhibits no bony tenderness.  Minimal resting tremor of right hand  Neurological: He is alert and oriented to person, place, and time. Gait normal.  Nursing note and vitals reviewed.    Assessment & Plan:  Case discussed with Dr. Daryll Drown  Mild cognitive impairment with memory loss Stable Continue aricept, if drooling/ runny nose becomes more of a problem may consider exelon patch  Erectile dysfunction -Concerned about cost, will prescribe sildenafil 20mg  take 2-5 tablets PRN Follow up 3 months  Lower urinary tract symptoms (LUTS) -Improved with flomax, continue 0.4mg  daily  Resting tremor -Stable Has follow up with Dr Jannifer Franklin next month    Medications Ordered Meds ordered this encounter  Medications  . triamcinolone cream (KENALOG) 0.5 %    Sig: Apply 1 application topically 3 (three) times daily.  . sildenafil (REVATIO) 20 MG tablet    Sig: Take 2-5 tablets (40-100 mg total) by mouth as needed (sexual activity).    Dispense:  50 tablet    Refill:  2  . tamsulosin (FLOMAX) 0.4 MG CAPS capsule    Sig: Take 1 capsule (0.4 mg total) by mouth daily.    Dispense:  90 capsule    Refill:  1   Other Orders No orders of the defined types were placed in this encounter.   Follow Up: Return in about 3  months (around 02/28/2016).

## 2015-11-28 NOTE — Assessment & Plan Note (Signed)
-  Concerned about cost, will prescribe sildenafil 20mg  take 2-5 tablets PRN Follow up 3 months

## 2015-11-28 NOTE — Assessment & Plan Note (Signed)
-  Stable Has follow up with Dr Jannifer Franklin next month

## 2015-11-28 NOTE — Patient Instructions (Signed)
General Instructions:   Please bring your medicines with you each time you come to clinic.  Medicines may include prescription medications, over-the-counter medications, herbal remedies, eye drops, vitamins, or other pills.   Progress Toward Treatment Goals:  No flowsheet data found.  Self Care Goals & Plans:  No flowsheet data found.  No flowsheet data found.   Care Management & Community Referrals:  No flowsheet data found.     Sildenafil tablets (Viagra) What is this medicine? SILDENAFIL (sil DEN a fil) is used to treat erection problems in men. This medicine may be used for other purposes; ask your health care provider or pharmacist if you have questions. What should I tell my health care provider before I take this medicine? They need to know if you have any of these conditions: -bleeding disorders -eye or vision problems, including a rare inherited eye disease called retinitis pigmentosa -anatomical deformation of the penis, Peyronie's disease, or history of priapism (painful and prolonged erection) -heart disease, angina, a history of heart attack, irregular heart beats, or other heart problems -high or low blood pressure -history of blood diseases, like sickle cell anemia or leukemia -history of stomach bleeding -kidney disease -liver disease -stroke -an unusual or allergic reaction to sildenafil, other medicines, foods, dyes, or preservatives -pregnant or trying to get pregnant -breast-feeding How should I use this medicine? Take this medicine by mouth with a glass of water. Follow the directions on the prescription label. The dose is usually taken 1 hour before sexual activity. You should not take the dose more than once per day. Do not take your medicine more often than directed. Talk to your pediatrician regarding the use of this medicine in children. This medicine is not used in children for this condition. Overdosage: If you think you have taken too much of  this medicine contact a poison control center or emergency room at once. NOTE: This medicine is only for you. Do not share this medicine with others. What if I miss a dose? This does not apply. Do not take double or extra doses. What may interact with this medicine? Do not take this medicine with any of the following medications: -cisapride -methscopolamine nitrate -nitrates like amyl nitrite, isosorbide dinitrate, isosorbide mononitrate, nitroglycerin -nitroprusside -other medicines for erectile dysfunction like avanafil, tadalafil, vardenafil -riociguat -other sildenafil products (Revatio) This medicine may also interact with the following medications: -certain drugs for high blood pressure -certain drugs for the treatment of HIV infection or AIDS -certain drugs used for fungal or yeast infections, like fluconazole, itraconazole, ketoconazole, and voriconazole -cimetidine -erythromycin -rifampin This list may not describe all possible interactions. Give your health care provider a list of all the medicines, herbs, non-prescription drugs, or dietary supplements you use. Also tell them if you smoke, drink alcohol, or use illegal drugs. Some items may interact with your medicine. What should I watch for while using this medicine? If you notice any changes in your vision while taking this drug, call your doctor or health care professional as soon as possible. Stop using this medicine and call your health care provider right away if you have a loss of sight in one or both eyes. Contact your doctor or health care professional right away if you have an erection that lasts longer than 4 hours or if it becomes painful. This may be a sign of a serious problem and must be treated right away to prevent permanent damage. If you experience symptoms of nausea, dizziness, chest pain or arm pain  upon initiation of sexual activity after taking this medicine, you should refrain from further activity and call  your doctor or health care professional as soon as possible. Do not drink alcohol to excess (examples, 5 glasses of wine or 5 shots of whiskey) when taking this medicine. When taken in excess, alcohol can increase your chances of getting a headache or getting dizzy, increasing your heart rate or lowering your blood pressure. Using this medicine does not protect you or your partner against HIV infection (the virus that causes AIDS) or other sexually transmitted diseases. What side effects may I notice from receiving this medicine? Side effects that you should report to your doctor or health care professional as soon as possible: -allergic reactions like skin rash, itching or hives, swelling of the face, lips, or tongue -breathing problems -changes in hearing -changes in vision -chest pain -fast, irregular heartbeat -prolonged or painful erection -seizures Side effects that usually do not require medical attention (report to your doctor or health care professional if they continue or are bothersome): -back pain -dizziness -flushing -headache -indigestion -muscle aches -nausea -stuffy or runny nose This list may not describe all possible side effects. Call your doctor for medical advice about side effects. You may report side effects to FDA at 1-800-FDA-1088. Where should I keep my medicine? Keep out of reach of children. Store at room temperature between 15 and 30 degrees C (59 and 86 degrees F). Throw away any unused medicine after the expiration date. NOTE: This sheet is a summary. It may not cover all possible information. If you have questions about this medicine, talk to your doctor, pharmacist, or health care provider.    2016, [employer]. (2013-10-13 13:19:04)

## 2015-11-29 ENCOUNTER — Other Ambulatory Visit: Payer: Self-pay | Admitting: Neurology

## 2015-12-02 ENCOUNTER — Other Ambulatory Visit: Payer: Self-pay

## 2015-12-02 MED ORDER — ALPRAZOLAM 0.5 MG PO TABS: ORAL_TABLET | ORAL | Status: DC

## 2015-12-02 NOTE — Telephone Encounter (Signed)
Received faxed rx request from pharmacy for 90 day refill. Re-printed for 90 days, awaiting MD signature.

## 2015-12-02 NOTE — Telephone Encounter (Signed)
Rx signed and faxed to pharmacy

## 2015-12-03 NOTE — Progress Notes (Signed)
Internal Medicine Clinic Attending  Case discussed with Dr. Hoffman at the time of the visit.  We reviewed the resident's history and exam and pertinent patient test results.  I agree with the assessment, diagnosis, and plan of care documented in the resident's note.  

## 2015-12-11 ENCOUNTER — Other Ambulatory Visit: Payer: Self-pay | Admitting: Neurology

## 2015-12-11 ENCOUNTER — Encounter: Payer: Self-pay | Admitting: Internal Medicine

## 2015-12-11 ENCOUNTER — Telehealth: Payer: Self-pay | Admitting: Internal Medicine

## 2015-12-11 ENCOUNTER — Ambulatory Visit (INDEPENDENT_AMBULATORY_CARE_PROVIDER_SITE_OTHER): Payer: Medicare Other | Admitting: Internal Medicine

## 2015-12-11 VITALS — BP 131/67 | HR 66 | Temp 97.5°F | Wt 132.5 lb

## 2015-12-11 DIAGNOSIS — R070 Pain in throat: Secondary | ICD-10-CM | POA: Diagnosis present

## 2015-12-11 NOTE — Assessment & Plan Note (Addendum)
The patient presents with a 3 day history of non-specific throat pain and chest burning. He has no other complaints and has been afebrile. He denies fevers, chills, sweats, n/v and abdominal pain. He has no additional acute complaints. This most likely represents a viral pharyngitis and will be self limiting. Other considerations are worsening of the patients chronic GERD although he does not think this is related to his GERD. -- We discussed supportive care as this is most appropriate when treating this type of viral infection -- We discussed the use of saline mouth rinses and over the counter throat lozenges -- We discussed for him to return or contact us if his symptoms worsen -- We discussed to continue the Protonix as prescribed

## 2015-12-11 NOTE — Telephone Encounter (Signed)
Has problem wants to talk nurse

## 2015-12-11 NOTE — Patient Instructions (Signed)
It was a pleasure seeing you today. Thank you for choosing [facility] for your healthcare needs.  For a viral infection supportive care is most appropriate -- use over the counter throat lozenges -- continue saline mouth rinses -- return for worsening of symptoms

## 2015-12-11 NOTE — Telephone Encounter (Signed)
Patient states he spoke with Bonnita Nasuti

## 2015-12-11 NOTE — Progress Notes (Signed)
CC: Throat Pain  HPI: Mr. [name] is a 75 y.o. male with a h/o of COPD, GERD and tobacco abuse who presents with a three day history of throat pain and a slight burning in his chest. The pain first started on Sunday and felt like a "tickle" it progressively moved to his chest and the patient states that he feels as if his chest is raw. He denies any headaches, post-nasal drip, increased rhinorrhea, or increased coughing. He has mild cough at baseline due to his COPD. He also denies any increased shortness of breath, or chest pain. He has taken his temperature at home and has been afebrile. He also denies chills, sweats, n/v and abdominal pain. He has no other acute complaints at today's visit.    Past Medical History  Diagnosis Date  . Hyperlipidemia   . 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 GERD (gastroesophageal reflux disease)   . Colonic polyp     Tubulovillous adenomatous  . Tobacco abuse     quit 2001  . Gout 12/2007    inadequate aspiration of MTP joint left great toe  06/18/08; -fluid:  red, bloody, cell count not performed (TNP) and no crystals or organisms seen.  [name] COPD (chronic obstructive pulmonary disease) ([place])   . Insomnia   . PERFORATION, INTESTINE 06/19/2006    Annotation: During colonoscopy Qualifier: History of  By: Oretha Ellis    . Delirium, acute 2015    After anesthesia for colectomy - resolved in 48hrs   Current Outpatient Rx  Name  Route  Sig  Dispense  Refill  . albuterol (PROVENTIL HFA;VENTOLIN HFA) 108 (90 Base) MCG/ACT inhaler   Inhalation   Inhale 1-2 puffs into the lungs every 6 (six) hours as needed for wheezing or shortness of breath.         . ALPRAZolam (XANAX) 0.5 MG tablet      TAKE ONE TABLET BY MOUTH AT BEDTIME AS NEEDED FOR ANXIETY   90 tablet   1   . aspirin 81 MG tablet   Oral   Take 81 mg by mouth every evening.          [name] atorvastatin (LIPITOR) 40 MG tablet   Oral   Take 1 tablet (40 mg total) by mouth daily.   90 tablet   3     . donepezil (ARICEPT) 10 MG tablet      TAKE ONE TABLET BY MOUTH AT BEDTIME   30 tablet   0   . Multiple Vitamin (MULTIVITAMIN) tablet   Oral   Take 1 tablet by mouth daily.         . naproxen sodium (ANAPROX) 220 MG tablet   Oral   Take 220 mg by mouth as needed.         . Omega 3 1000 MG CAPS   Oral   Take 1 capsule by mouth daily.         . pantoprazole (PROTONIX) 40 MG tablet   Oral   Take 1 tablet (40 mg total) by mouth daily.   90 tablet   3   . Polyethyl Glycol-Propyl Glycol (SYSTANE) 0.4-0.3 % SOLN   Both Eyes   Place 1 drop into both eyes 2 (two) times daily.         . sildenafil (REVATIO) 20 MG tablet   Oral   Take 2-5 tablets (40-100 mg total) by mouth as needed (sexual activity).   50 tablet   2   .  tamsulosin (FLOMAX) 0.4 MG CAPS capsule   Oral   Take 1 capsule (0.4 mg total) by mouth daily.   90 capsule   1   . triamcinolone cream (KENALOG) 0.5 %   Topical   Apply 1 application topically 3 (three) times daily.            Review of Systems: A complete ROS was negative except as per HPI.  Physical Exam: Filed Vitals:   12/11/15 1434  BP: 131/67  Pulse: 66  Temp: 97.5 F (36.4 C)  TempSrc: Oral  Weight: 132 lb 8 oz (60.102 kg)  SpO2: 98%   General appearance: alert and appears stated age Head: Normocephalic, without obvious abnormality, atraumatic, sinuses nontender to percussion Eyes: conjunctivae/corneas clear. PERRL, EOM's intact. Fundi benign. Ears: normal TM's and external ear canals both ears Nose: Nares normal. Septum midline. Mucosa normal. No drainage or sinus tenderness., no discharge Throat: Mild enlarged submandibular lymph node on the right side, no other lymphadenopathy noted Neck: no adenopathy Lungs: Mild bilateral wheezes in the lower lung fields  Heart: regular rate and rhythm, S1, S2 normal, no murmur, click, rub or gallop Abdomen: soft, non-tender; bowel sounds normal; no masses,  no  organomegaly  Assessment & Plan:  See encounters tab for problem based medical decision making. Patient seen with Dr. Dareen Piano  Signed: Ophelia Shoulder, MD 12/11/2015, 3:20 PM  Pager: [phone]

## 2015-12-12 NOTE — Progress Notes (Signed)
Internal Medicine Clinic Attending  I saw and evaluated the patient.  I personally confirmed the key portions of the history and exam documented by Dr. Taylor and I reviewed pertinent patient test results.  The assessment, diagnosis, and plan were formulated together and I agree with the documentation in the resident's note.  

## 2015-12-14 DIAGNOSIS — M545 Low back pain: Secondary | ICD-10-CM | POA: Diagnosis not present

## 2015-12-16 ENCOUNTER — Encounter: Payer: Self-pay | Admitting: Neurology

## 2015-12-16 ENCOUNTER — Telehealth: Payer: Self-pay | Admitting: Neurology

## 2015-12-16 ENCOUNTER — Ambulatory Visit (INDEPENDENT_AMBULATORY_CARE_PROVIDER_SITE_OTHER): Payer: Medicare Other | Admitting: Neurology

## 2015-12-16 VITALS — BP 119/65 | HR 62 | Ht 66.0 in | Wt 132.5 lb

## 2015-12-16 DIAGNOSIS — G3184 Mild cognitive impairment, so stated: Secondary | ICD-10-CM

## 2015-12-16 MED ORDER — DONEPEZIL HCL 10 MG PO TABS: 10.0000 mg | ORAL_TABLET | Freq: Every day | ORAL | Status: DC

## 2015-12-16 NOTE — Progress Notes (Signed)
Reason for visit: Memory disturbance  [name] is an 75 y.o. male  History of present illness:  [name] is a 75 year old right-handed white male with a history of a mild memory disturbance, the patient has been on Aricept taking 10 mg at night and tolerating the medication fairly well. He does have a runny nose and some occasional drooling. The patient recently has had some issues with low back discomfort. The patient is being followed by [facility], he has had a MRI of the low back, but the results are pending. He reports pain in the back with walking, better with rest. The pain may go down both legs. He has not had any falls or significant weakness in the legs. He denies issues controlling the bowels or the bladder. He does report some itching sensations throughout the body, he has been seen by a dermatologist for this. He comes to this office for an evaluation.  Past Medical History  Diagnosis Date  . Hyperlipidemia   . 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 GERD (gastroesophageal reflux disease)   . Colonic polyp     Tubulovillous adenomatous  . Tobacco abuse     quit 2001  . Gout 12/2007    inadequate aspiration of MTP joint left great toe  06/18/08; -fluid:  red, bloody, cell count not performed (TNP) and no crystals or organisms seen.  [name] COPD (chronic obstructive pulmonary disease) ([place])   . Insomnia   . PERFORATION, INTESTINE 06/19/2006    Annotation: During colonoscopy Qualifier: History of  By: Oretha Ellis    . Delirium, acute 2015    After anesthesia for colectomy - resolved in 48hrs    Past Surgical History  Procedure Laterality Date  . Wrist surgery Left   . Colonoscopy    . Dupuytren contracture release Right   . Cataract extraction Bilateral   . Neck surgery  1970    removal cyst outside neck  . Laparoscopic partial colectomy Right 01/18/2014    Procedure: LAPAROSCOPIC PARTIAL RIGHT COLECTOMY;  Surgeon: Adin Hector, MD;  Location: [facility] ORS;  Service:  General;  Laterality: Right;    Family History  Problem Relation Age of Onset  . Colon cancer Neg Hx   . Pancreatic cancer Neg Hx   . Stomach cancer Neg Hx   . Rectal cancer Neg Hx   . Cerebral aneurysm Mother   . Cancer Father     lung cancer/asbestos    Social history:  reports that he quit smoking about 15 years ago. His smoking use included Cigarettes. He has never used smokeless tobacco. He reports that he does not drink alcohol or use illicit drugs.    Allergies  Allergen Reactions  . Ambien [Zolpidem Tartrate] Other (See Comments)    paranoia    Medications:  Prior to Admission medications   Medication Sig Start Date End Date Taking? Authorizing Provider  albuterol (PROVENTIL HFA;VENTOLIN HFA) 108 (90 Base) MCG/ACT inhaler Inhale 1-2 puffs into the lungs every 6 (six) hours as needed for wheezing or shortness of breath.   Yes Historical Provider, MD  ALPRAZolam Duanne Moron) 0.5 MG tablet TAKE ONE TABLET BY MOUTH AT BEDTIME AS NEEDED FOR ANXIETY 12/02/15  Yes Kathrynn Ducking, MD  aspirin 81 MG tablet Take 81 mg by mouth every evening.    Yes Historical Provider, MD  atorvastatin (LIPITOR) 40 MG tablet Take 1 tablet (40 mg total) by mouth daily. 04/04/15 04/03/16 Yes Lucious Groves, DO  donepezil (ARICEPT)  10 MG tablet TAKE ONE TABLET BY MOUTH AT BEDTIME 12/11/15  Yes Kathrynn Ducking, MD  Multiple Vitamin (MULTIVITAMIN) tablet Take 1 tablet by mouth daily.   Yes Historical Provider, MD  naproxen sodium (ANAPROX) 220 MG tablet Take 220 mg by mouth as needed.   Yes Historical Provider, MD  Omega 3 1000 MG CAPS Take 1 capsule by mouth daily.   Yes Historical Provider, MD  pantoprazole (PROTONIX) 40 MG tablet Take 1 tablet (40 mg total) by mouth daily. 06/06/15  Yes Oval Linsey, MD  Polyethyl Glycol-Propyl Glycol (SYSTANE) 0.4-0.3 % SOLN Place 1 drop into both eyes 2 (two) times daily.   Yes Historical Provider, MD  tamsulosin (FLOMAX) 0.4 MG CAPS capsule Take 1 capsule (0.4 mg  total) by mouth daily. 11/28/15  Yes Lucious Groves, DO  triamcinolone cream (KENALOG) 0.5 % Apply 1 application topically 3 (three) times daily.   Yes Historical Provider, MD    ROS:  Out of a complete 14 system review of symptoms, the patient complains only of the following symptoms, and all other reviewed systems are negative.  Fatigue Runny nose Light sensitivity, double vision Shortness of breath Back pain, walking difficulty Itching Tremors Anxiety  Blood pressure 119/65, pulse 62, height 5\' 6"  (1.676 m), weight 132 lb 8 oz (60.102 kg).  Physical Exam  General: The patient is alert and cooperative at the time of the examination.  Skin: No significant peripheral edema is noted.   Neurologic Exam  Mental status: The patient is alert and oriented x 3 at the time of the examination. The patient has apparent normal recent and remote memory, with an apparently normal attention span and concentration ability. Mini-Mental Status Examination done today shows a total score 29/30.   Cranial nerves: Facial symmetry is present. Speech is normal, no aphasia or dysarthria is noted. Extraocular movements are full. Visual fields are full.  Motor: The patient has good strength in all 4 extremities.  Sensory examination: Soft touch sensation is symmetric on the face, arms, and legs.  Coordination: The patient has good finger-nose-finger and heel-to-shin bilaterally.  Gait and station: The patient has a normal gait. Tandem gait is normal. Romberg is negative. No drift is seen.  Reflexes: Deep tendon reflexes are symmetric, but are somewhat brisk in the lower extremities at the knees and ankle jerks..   Assessment/Plan:  1. Memory disturbance  2. Low back pain  The patient is having new issues with low back pain. He is being followed by orthopedic surgery for this. He will be continued on the Aricept that 10 mg at night. He will follow-up in 8 or 9 months, sooner if needed. He has  been relatively stable with his memory issues.  Jill Alexanders MD 12/16/2015 3:53 PM  [facility] [address] [place], [place] [address]  Phone [phone] Fax [phone]

## 2015-12-16 NOTE — Telephone Encounter (Signed)
Pt called in to let Dr. Jannifer Franklin know his rx for Xanax has 30 tablets. He has 22 out of 30 left.

## 2015-12-19 DIAGNOSIS — M5441 Lumbago with sciatica, right side: Secondary | ICD-10-CM | POA: Diagnosis not present

## 2015-12-20 ENCOUNTER — Other Ambulatory Visit: Payer: Self-pay | Admitting: Orthopedic Surgery

## 2015-12-20 DIAGNOSIS — M546 Pain in thoracic spine: Secondary | ICD-10-CM

## 2015-12-20 DIAGNOSIS — M5441 Lumbago with sciatica, right side: Secondary | ICD-10-CM

## 2015-12-25 ENCOUNTER — Inpatient Hospital Stay
Admission: RE | Admit: 2015-12-25 | Discharge: 2015-12-25 | Disposition: A | Discharge status: Home / Self Care | Payer: Self-pay | Source: Ambulatory Visit | Attending: Orthopedic Surgery | Admitting: Orthopedic Surgery

## 2015-12-25 ENCOUNTER — Ambulatory Visit
Admission: RE | Admit: 2015-12-25 | Discharge: 2015-12-25 | Disposition: A | Discharge status: Home / Self Care | Payer: Medicare Other | Source: Ambulatory Visit | Attending: Orthopedic Surgery | Admitting: Orthopedic Surgery

## 2015-12-25 ENCOUNTER — Other Ambulatory Visit: Payer: Self-pay | Admitting: Orthopedic Surgery

## 2015-12-25 DIAGNOSIS — M5126 Other intervertebral disc displacement, lumbar region: Secondary | ICD-10-CM | POA: Diagnosis not present

## 2015-12-25 DIAGNOSIS — R52 Pain, unspecified: Secondary | ICD-10-CM

## 2015-12-25 DIAGNOSIS — M546 Pain in thoracic spine: Secondary | ICD-10-CM

## 2015-12-25 DIAGNOSIS — M5441 Lumbago with sciatica, right side: Secondary | ICD-10-CM

## 2015-12-25 MED ORDER — IOPAMIDOL (ISOVUE-M 300) INJECTION 61%
10.0000 mL | Freq: Once | INTRAMUSCULAR | Status: AC | PRN
  Administered 2015-12-25: 10 mL via INTRATHECAL

## 2015-12-25 MED ORDER — MEPERIDINE HCL 100 MG/ML IJ SOLN
50.0000 mg | Freq: Once | INTRAMUSCULAR | Status: AC
  Administered 2015-12-25: 50 mg via INTRAMUSCULAR

## 2015-12-25 MED ORDER — ONDANSETRON HCL 4 MG/2ML IJ SOLN
4.0000 mg | Freq: Once | INTRAMUSCULAR | Status: AC
Start: 2015-12-25 — End: 2015-12-25
  Administered 2015-12-25: 4 mg via INTRAMUSCULAR

## 2015-12-25 NOTE — Discharge Instructions (Signed)

## 2015-12-31 DIAGNOSIS — M546 Pain in thoracic spine: Secondary | ICD-10-CM | POA: Diagnosis not present

## 2015-12-31 DIAGNOSIS — M5441 Lumbago with sciatica, right side: Secondary | ICD-10-CM | POA: Diagnosis not present

## 2016-01-06 ENCOUNTER — Other Ambulatory Visit: Payer: Self-pay

## 2016-01-06 ENCOUNTER — Telehealth: Payer: Self-pay | Admitting: *Deleted

## 2016-01-06 NOTE — Telephone Encounter (Signed)
Requesting to speak with a nurse regarding tamsulosin.

## 2016-01-06 NOTE — Telephone Encounter (Signed)
No answer. No VM

## 2016-01-06 NOTE — Telephone Encounter (Signed)
Pt calls and states that dr Jenny Reichmann hall, dermatology told him that a persistent rash that causes itching possibly is caused by the statin that pt is taking, pt does not think that it is the statin but dr hall told him to ask you, please advise Pt's next appt w/ dr Heber Joy 10/5

## 2016-01-07 NOTE — Telephone Encounter (Signed)
Spoke to patient, he will stop the Atorvastatin for now, see if rash clears up, if it does we will discuss alternative statin medications (possibly crestor) verus other agents.

## 2016-01-13 DIAGNOSIS — M5441 Lumbago with sciatica, right side: Secondary | ICD-10-CM | POA: Diagnosis not present

## 2016-01-15 DIAGNOSIS — M5441 Lumbago with sciatica, right side: Secondary | ICD-10-CM | POA: Diagnosis not present

## 2016-01-22 DIAGNOSIS — M5441 Lumbago with sciatica, right side: Secondary | ICD-10-CM | POA: Diagnosis not present

## 2016-01-24 DIAGNOSIS — M5441 Lumbago with sciatica, right side: Secondary | ICD-10-CM | POA: Diagnosis not present

## 2016-01-28 DIAGNOSIS — M5441 Lumbago with sciatica, right side: Secondary | ICD-10-CM | POA: Diagnosis not present

## 2016-01-30 DIAGNOSIS — M5441 Lumbago with sciatica, right side: Secondary | ICD-10-CM | POA: Diagnosis not present

## 2016-02-03 DIAGNOSIS — M5441 Lumbago with sciatica, right side: Secondary | ICD-10-CM | POA: Diagnosis not present

## 2016-02-05 DIAGNOSIS — M5441 Lumbago with sciatica, right side: Secondary | ICD-10-CM | POA: Diagnosis not present

## 2016-03-03 DIAGNOSIS — M5441 Lumbago with sciatica, right side: Secondary | ICD-10-CM | POA: Diagnosis not present

## 2016-03-11 DIAGNOSIS — M5441 Lumbago with sciatica, right side: Secondary | ICD-10-CM | POA: Diagnosis not present

## 2016-03-12 ENCOUNTER — Ambulatory Visit (INDEPENDENT_AMBULATORY_CARE_PROVIDER_SITE_OTHER): Payer: Medicare Other | Admitting: Internal Medicine

## 2016-03-12 ENCOUNTER — Encounter: Payer: Self-pay | Admitting: Internal Medicine

## 2016-03-12 VITALS — BP 109/57 | HR 60 | Temp 98.0°F | Ht 66.0 in | Wt 131.0 lb

## 2016-03-12 DIAGNOSIS — G252 Other specified forms of tremor: Secondary | ICD-10-CM

## 2016-03-12 DIAGNOSIS — Z789 Other specified health status: Secondary | ICD-10-CM

## 2016-03-12 DIAGNOSIS — L298 Other pruritus: Secondary | ICD-10-CM

## 2016-03-12 DIAGNOSIS — M5020 Other cervical disc displacement, unspecified cervical region: Secondary | ICD-10-CM

## 2016-03-12 DIAGNOSIS — J449 Chronic obstructive pulmonary disease, unspecified: Secondary | ICD-10-CM | POA: Diagnosis present

## 2016-03-12 DIAGNOSIS — Z23 Encounter for immunization: Secondary | ICD-10-CM | POA: Diagnosis not present

## 2016-03-12 DIAGNOSIS — Z87891 Personal history of nicotine dependence: Secondary | ICD-10-CM

## 2016-03-12 DIAGNOSIS — M5441 Lumbago with sciatica, right side: Secondary | ICD-10-CM

## 2016-03-12 DIAGNOSIS — G8929 Other chronic pain: Secondary | ICD-10-CM | POA: Diagnosis not present

## 2016-03-12 DIAGNOSIS — L299 Pruritus, unspecified: Secondary | ICD-10-CM

## 2016-03-12 DIAGNOSIS — K219 Gastro-esophageal reflux disease without esophagitis: Secondary | ICD-10-CM

## 2016-03-12 DIAGNOSIS — K222 Esophageal obstruction: Secondary | ICD-10-CM

## 2016-03-12 DIAGNOSIS — IMO0001 Reserved for inherently not codable concepts without codable children: Secondary | ICD-10-CM

## 2016-03-12 DIAGNOSIS — F5101 Primary insomnia: Secondary | ICD-10-CM | POA: Diagnosis not present

## 2016-03-12 DIAGNOSIS — G3184 Mild cognitive impairment, so stated: Secondary | ICD-10-CM | POA: Diagnosis not present

## 2016-03-12 DIAGNOSIS — R259 Unspecified abnormal involuntary movements: Secondary | ICD-10-CM

## 2016-03-12 NOTE — Progress Notes (Signed)
[facility] Subjective:  HPI: [name] is a 75 y.o. male who presents for follow up of COPD, MCI.  He reports his breathing has been stable and has been managing well with albuterol only as needed.   For his MCI he has continued to follow up with Dr Jannifer Franklin, he is stable on aricept.  He has had some runny nose but otherwise is tolerating it well. He has been working on good sleep hygiene and reports his insomnia has improved, he is still occasionally using xanax. His GERD is well controlled with protonix.  He is c/o some generalized pruritis.  He reports that it typically manifest on his elbows, forearms, legs and back,  Sometimes the area is mildly red but does not notice a rash.  He does not report any new medications, or changes in detergents, soaps.  He went to see a dermatologist who prescribed him a cream which he does not feel helped.  He has seen this dermatologist before but feels that his complaints were somewhat dismissed and would like to see a different dermatologist.  He has been seeing Dr Gladstone Lighter for low back pain, he was last seen on 12/19/15.  I have reviewed this note, he was reporting low back pain with right side radicular symptoms. An MRI was obtained which showed a small left central disc protrusion which would not likely cause his symptoms.  Dr Gladstone Lighter was concerned about hyperreflexia of his ankle and the need to sit and obtained a CT mylography which does not appear to show any nerve impingement on the S1 nerve root.  He does have multiple areas of disc protrusion. It appears that he may have been referred to PT but that was canceled until the CT was obtained.   Review of Systems: Review of Systems  Constitutional: Negative for fever.  Eyes: Negative for blurred vision.  Respiratory: Negative for cough, shortness of breath and wheezing.   Cardiovascular: Negative for chest pain and leg swelling.  Gastrointestinal: Negative for abdominal pain  and heartburn.  Skin: Positive for itching. Negative for rash.  Neurological: Positive for tremors (occasionally RUE). Negative for headaches.    Objective:  Physical Exam: Vitals:   03/12/16 1053  BP: (!) 109/57  Pulse: 60  Temp: 98 F (36.7 C)  TempSrc: Oral  SpO2: 98%  Weight: 131 lb (59.4 kg)  Height: 5\' 6"  (1.676 m)   Physical Exam  Constitutional: He is well-developed, well-nourished, and in no distress.  Cardiovascular: Normal rate, regular rhythm and normal heart sounds.   Pulmonary/Chest: Effort normal and breath sounds normal.  Abdominal: Soft. Bowel sounds are normal. There is no tenderness.  Musculoskeletal: He exhibits no edema.  No resting tremor appreciated today  Psychiatric: Affect normal.  Nursing note and vitals reviewed.  Assessment & Plan:  COPD, moderate ([place]) A: COPD moderate  P:  He continues to do very well with PRN albuterol.  We may consider repeating his PFTs at his next visit as he certainly clinically appears to have mild copd  Gastroesophageal reflux disease with stricture A: GERD  P: Continue protonix  Mild cognitive impairment with memory loss A: MCI  P: Continue aricept  Primary insomnia A; Primary insomnia  P:  Continue Xanax PRN  Resting tremor A; resting tremor  P: Stable continue to monitor for progression  Itching A: Itching  P: I do not appreciate a rash on him today,  I will first start by obtaining a records release to see  what has previously been done and we can consider referring to a different dermatologist pending these records.  Chronic low back pain with right-sided sciatica A: Chronic low back pain with right side sciatica  P: Encouraged to continue BID Naproxen I would think PT would be a good idea, will have him follow up with Dr Gladstone Lighter. He reports he is unable to walk long distances without needing to stop and rest, requesting handicap placard.  I provided him with a 47month handicap  application   Medications Ordered No orders of the defined types were placed in this encounter.  Other Orders Orders Placed This Encounter  Procedures  . Flu Vaccine QUAD 36+ mos IM   Follow Up: Return in about 6 months (around 09/10/2016), or if symptoms worsen or fail to improve.

## 2016-03-13 DIAGNOSIS — G8929 Other chronic pain: Secondary | ICD-10-CM | POA: Insufficient documentation

## 2016-03-13 DIAGNOSIS — M5441 Lumbago with sciatica, right side: Secondary | ICD-10-CM

## 2016-03-13 NOTE — Assessment & Plan Note (Signed)
A: COPD moderate  P:  He continues to do very well with PRN albuterol.  We may consider repeating his PFTs at his next visit as he certainly clinically appears to have mild copd

## 2016-03-13 NOTE — Assessment & Plan Note (Signed)
A: MCI  P: Continue aricept

## 2016-03-13 NOTE — Assessment & Plan Note (Signed)
A: GERD  P: Continue protonix

## 2016-03-13 NOTE — Assessment & Plan Note (Signed)
A: Chronic low back pain with right side sciatica  P: Encouraged to continue BID Naproxen I would think PT would be a good idea, will have him follow up with Dr Gladstone Lighter. He reports he is unable to walk long distances without needing to stop and rest, requesting handicap placard.  I provided him with a 26month handicap application

## 2016-03-13 NOTE — Assessment & Plan Note (Signed)
A: Itching  P: I do not appreciate a rash on him today,  I will first start by obtaining a records release to see what has previously been done and we can consider referring to a different dermatologist pending these records.

## 2016-03-13 NOTE — Assessment & Plan Note (Signed)
A; resting tremor  P: Stable continue to monitor for progression

## 2016-03-13 NOTE — Assessment & Plan Note (Signed)
A; Primary insomnia  P:  Continue Xanax PRN

## 2016-03-18 DIAGNOSIS — M5441 Lumbago with sciatica, right side: Secondary | ICD-10-CM | POA: Diagnosis not present

## 2016-04-02 DIAGNOSIS — H538 Other visual disturbances: Secondary | ICD-10-CM | POA: Diagnosis not present

## 2016-04-02 DIAGNOSIS — H532 Diplopia: Secondary | ICD-10-CM | POA: Diagnosis not present

## 2016-04-02 DIAGNOSIS — H5034 Intermittent alternating exotropia: Secondary | ICD-10-CM | POA: Diagnosis not present

## 2016-04-27 DIAGNOSIS — H40013 Open angle with borderline findings, low risk, bilateral: Secondary | ICD-10-CM | POA: Diagnosis not present

## 2016-06-05 ENCOUNTER — Other Ambulatory Visit: Payer: Self-pay | Admitting: Internal Medicine

## 2016-06-05 DIAGNOSIS — K219 Gastro-esophageal reflux disease without esophagitis: Secondary | ICD-10-CM

## 2016-06-05 DIAGNOSIS — K222 Esophageal obstruction: Principal | ICD-10-CM

## 2016-06-12 ENCOUNTER — Telehealth: Payer: Self-pay | Admitting: Internal Medicine

## 2016-06-12 MED ORDER — ATORVASTATIN CALCIUM 40 MG PO TABS: 40.0000 mg | ORAL_TABLET | Freq: Every day | ORAL | 3 refills | Status: DC

## 2016-06-12 NOTE — Telephone Encounter (Signed)
Patient requesting a refill on his Lipitor Medication.

## 2016-06-12 NOTE — Telephone Encounter (Signed)
Atorvastatin was previously d/c because concerns that it could have cased a rash.  I called [name], he did not feel that it was the cause of the rash and returned to taking it without any recurrence of the rash.  I have sent him in a new prescription for atorvastatin.

## 2016-06-12 NOTE — Telephone Encounter (Signed)
Pt requesting a refill on Atorvastatin; do not see on current med list.Request to PCP.

## 2016-06-15 ENCOUNTER — Other Ambulatory Visit: Payer: Self-pay | Admitting: *Deleted

## 2016-06-15 MED ORDER — TAMSULOSIN HCL 0.4 MG PO CAPS: 0.4000 mg | ORAL_CAPSULE | Freq: Every day | ORAL | 1 refills | Status: DC

## 2016-06-17 ENCOUNTER — Ambulatory Visit (INDEPENDENT_AMBULATORY_CARE_PROVIDER_SITE_OTHER): Payer: Medicare Other | Admitting: Adult Health

## 2016-06-17 ENCOUNTER — Encounter: Payer: Self-pay | Admitting: Adult Health

## 2016-06-17 VITALS — BP 142/72 | HR 68 | Resp 20 | Ht 66.0 in | Wt 131.0 lb

## 2016-06-17 DIAGNOSIS — R413 Other amnesia: Secondary | ICD-10-CM

## 2016-06-17 DIAGNOSIS — R251 Tremor, unspecified: Secondary | ICD-10-CM | POA: Diagnosis not present

## 2016-06-17 MED ORDER — MEMANTINE HCL 28 X 5 MG & 21 X 10 MG PO TABS: ORAL_TABLET | ORAL | 0 refills | Status: DC

## 2016-06-17 NOTE — Patient Instructions (Addendum)
Memory score slightly declined Continue Aricept Start Namenda titration pack. After 1 month if tolerating please call for the maintenance dose. If your symptoms worsen or you develop new symptoms please let us know.   Memantine Tablets What is this medicine? MEMANTINE (MEM an teen) is used to treat dementia caused by Alzheimer's disease. This medicine may be used for other purposes; ask your health care provider or pharmacist if you have questions. COMMON BRAND NAME(S): Namenda What should I tell my health care provider before I take this medicine? They need to know if you have any of these conditions: -difficulty passing urine -kidney disease -liver disease -seizures -an unusual or allergic reaction to memantine, other medicines, foods, dyes, or preservatives -pregnant or trying to get pregnant -breast-feeding How should I use this medicine? Take this medicine by mouth with a glass of water. Follow the directions on the prescription label. You may take this medicine with or without food. Take your doses at regular intervals. Do not take your medicine more often than directed. Continue to take your medicine even if you feel better. Do not stop taking except on the advice of your doctor or health care professional. Talk to your pediatrician regarding the use of this medicine in children. Special care may be needed. Overdosage: If you think you have taken too much of this medicine contact a poison control center or emergency room at once. NOTE: This medicine is only for you. Do not share this medicine with others. What if I miss a dose? If you miss a dose, take it as soon as you can. If it is almost time for your next dose, take only that dose. Do not take double or extra doses. If you do not take your medicine for several days, contact your health care provider. Your dose may need to be changed. What may interact with this  medicine? -acetazolamide -amantadine -cimetidine -dextromethorphan -dofetilide -hydrochlorothiazide -ketamine -metformin -methazolamide -quinidine -ranitidine -sodium bicarbonate -triamterene This list may not describe all possible interactions. Give your health care provider a list of all the medicines, herbs, non-prescription drugs, or dietary supplements you use. Also tell them if you smoke, drink alcohol, or use illegal drugs. Some items may interact with your medicine. What should I watch for while using this medicine? Visit your doctor or health care professional for regular checks on your progress. Check with your doctor or health care professional if there is no improvement in your symptoms or if they get worse. You may get drowsy or dizzy. Do not drive, use machinery, or do anything that needs mental alertness until you know how this drug affects you. Do not stand or sit up quickly, especially if you are an older patient. This reduces the risk of dizzy or fainting spells. Alcohol can make you more drowsy and dizzy. Avoid alcoholic drinks. What side effects may I notice from receiving this medicine? Side effects that you should report to your doctor or health care professional as soon as possible: -allergic reactions like skin rash, itching or hives, swelling of the face, lips, or tongue -agitation or a feeling of restlessness -depressed mood -dizziness -hallucinations -redness, blistering, peeling or loosening of the skin, including inside the mouth -seizures -vomiting Side effects that usually do not require medical attention (report to your doctor or health care professional if they continue or are bothersome): -constipation -diarrhea -headache -nausea -trouble sleeping This list may not describe all possible side effects. Call your doctor for medical advice about side effects. You may  report side effects to FDA at 1-800-FDA-1088. Where should I keep my medicine? Keep  out of the reach of children. Store at room temperature between 15 degrees and 30 degrees C (59 degrees and 86 degrees F). Throw away any unused medicine after the expiration date. NOTE: This sheet is a summary. It may not cover all possible information. If you have questions about this medicine, talk to your doctor, pharmacist, or health care provider.  2017 Elsevier/Gold Standard (2013-03-13 14:10:42)

## 2016-06-17 NOTE — Progress Notes (Signed)
PATIENT: [name] DOB: 1940/07/02  REASON FOR VISIT: follow up- memory HISTORY FROM: patient  HISTORY OF PRESENT ILLNESS: [name] is a 76 year old male with a history of mild memory disturbance. He returns today for follow-up. He is currently taking Aricept 10 mg at bedtime. He feels that his memory has gotten slightly worse. He lives at home alone. He is able to complete all ADLs. He reports that he handles his own finances however he has noticed that he has trouble keeping up. He denies missing any payments. He states that he does prepare all his meals. He has occasionally burned tea that he was cooking on the stove. The patient operates a motor vehicle. He does state that he uses a GPS to avoid getting lost. Denies any trouble sleeping. Denies any changes in mood or behavior. The patient does have some family nearby. His children live in [place]. He does participate in the widowed group weekly. He also has dinner with his aunt once a week. He was going to the [facility] and participating in an exercise class however he states that since his back injury he has not been going to this. Patient does report that he has a tremor intermittently in the right hand. He states that it normally occurs at rest. He returns today for an evaluation.  HISTORY 12/16/15 (WILLIS): [name] is a 76 year old right-handed white male with a history of a mild memory disturbance, the patient has been on Aricept taking 10 mg at night and tolerating the medication fairly well. He does have a runny nose and some occasional drooling. The patient recently has had some issues with low back discomfort. The patient is being followed by [facility], he has had a MRI of the low back, but the results are pending. He reports pain in the back with walking, better with rest. The pain may go down both legs. He has not had any falls or significant weakness in the legs. He denies issues controlling the bowels or the  bladder. He does report some itching sensations throughout the body, he has been seen by a dermatologist for this. He comes to this office for an evaluation.   REVIEW OF SYSTEMS: Out of a complete 14 system review of symptoms, the patient complains only of the following symptoms, and all other reviewed systems are negative.  Light sensitivity, double vision, blurred vision, cough, drooling, unexpected weight change, muscle cramps, walking difficulty, itching, nervous/anxious, tremors, memory loss  ALLERGIES: Allergies  Allergen Reactions  . Ambien [Zolpidem Tartrate] Other (See Comments)    paranoia    HOME MEDICATIONS: Outpatient Medications Prior to Visit  Medication Sig Dispense Refill  . albuterol (PROVENTIL HFA;VENTOLIN HFA) 108 (90 Base) MCG/ACT inhaler Inhale 1-2 puffs into the lungs every 6 (six) hours as needed for wheezing or shortness of breath.    . ALPRAZolam (XANAX) 0.5 MG tablet TAKE ONE TABLET BY MOUTH AT BEDTIME AS NEEDED FOR ANXIETY 90 tablet 1  . aspirin 81 MG tablet Take 81 mg by mouth every evening.     [name] atorvastatin (LIPITOR) 40 MG tablet Take 1 tablet (40 mg total) by mouth daily. 90 tablet 3  . donepezil (ARICEPT) 10 MG tablet Take 1 tablet (10 mg total) by mouth at bedtime. 90 tablet 3  . Multiple Vitamin (MULTIVITAMIN) tablet Take 1 tablet by mouth daily.    . naproxen sodium (ANAPROX) 220 MG tablet Take 220 mg by mouth as needed.    . Omega 3  1000 MG CAPS Take 1 capsule by mouth daily.    . pantoprazole (PROTONIX) 40 MG tablet TAKE ONE TABLET BY MOUTH ONCE DAILY 90 tablet 3  . Polyethyl Glycol-Propyl Glycol (SYSTANE) 0.4-0.3 % SOLN Place 1 drop into both eyes 2 (two) times daily.    . tamsulosin (FLOMAX) 0.4 MG CAPS capsule Take 1 capsule (0.4 mg total) by mouth daily. 90 capsule 1  . triamcinolone cream (KENALOG) 0.5 % Apply 1 application topically 3 (three) times daily.     No facility-administered medications prior to visit.     PAST MEDICAL  HISTORY: Past Medical History:  Diagnosis Date  . 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 Colonic polyp    Tubulovillous adenomatous  . COPD (chronic obstructive pulmonary disease) ([place])   . Delirium, acute 2015   After anesthesia for colectomy - resolved in 48hrs  . GERD (gastroesophageal reflux disease)   . Gout 12/2007   inadequate aspiration of MTP joint left great toe  06/18/08; -fluid:  red, bloody, cell count not performed (TNP) and no crystals or organisms seen.  . Hyperlipidemia   . Insomnia   . PERFORATION, INTESTINE 06/19/2006   Annotation: During colonoscopy Qualifier: History of  By: Oretha Ellis    . Tobacco abuse    quit 2001    PAST SURGICAL HISTORY: Past Surgical History:  Procedure Laterality Date  . CATARACT EXTRACTION Bilateral   . COLONOSCOPY    . DUPUYTREN CONTRACTURE RELEASE Right   . LAPAROSCOPIC PARTIAL COLECTOMY Right 01/18/2014   Procedure: LAPAROSCOPIC PARTIAL RIGHT COLECTOMY;  Surgeon: Adin Hector, MD;  Location: [facility];  Service: General;  Laterality: Right;  . NECK SURGERY  1970   removal cyst outside neck  . WRIST SURGERY Left     FAMILY HISTORY: Family History  Problem Relation Age of Onset  . Colon cancer Neg Hx   . Pancreatic cancer Neg Hx   . Stomach cancer Neg Hx   . Rectal cancer Neg Hx   . Cerebral aneurysm Mother   . Cancer Father     lung cancer/asbestos    SOCIAL HISTORY: Social History   Social History  . Marital status: Widowed    Spouse name: N/A  . Number of children: 1  . Years of education: 5   Occupational History  . Retired Retired   Social History Main Topics  . Smoking status: Former Smoker    Types: Cigarettes    Quit date: 08/27/2000  . Smokeless tobacco: Never Used  . Alcohol use No     Comment: none x 7 years  . Drug use: No  . Sexual activity: Not on file   Other Topics Concern  . Not on file   Social History Narrative   Lives at home alone   Patient is right handed.   Patient drinks about 2-3  glasses of tea daily      PHYSICAL EXAM  Vitals:   06/17/16 1443  BP: (!) 142/72  Pulse: 68  Resp: 20  Weight: 131 lb (59.4 kg)  Height: 5\' 6"  (1.676 m)   Body mass index is 21.14 kg/m.   MMSE - Mini Mental State Exam 06/17/2016 12/16/2015 06/11/2015  Orientation to time 4 5 5   Orientation to Place 5 5 5   Registration 3 3 3   Attention/ Calculation 3 5 5   Recall 3 2 3   Language- name 2 objects 2 2 2   Language- repeat 1 1 1   Language- follow 3 step command 3 3 3   Language-  read & follow direction 1 1 1   Write a sentence 1 1 1   Copy design 0 1 1  Total score 26 29 30      Generalized: Well developed, in no acute distress   Neurological examination  Mentation: Alert oriented to time, place, history taking. Follows all commands speech and language fluent Cranial nerve II-XII: Pupils were equal round reactive to light. Extraocular movements were full, visual field were full on confrontational test. Facial sensation and strength were normal. Uvula tongue midline. Head turning and shoulder shrug  were normal and symmetric. Motor: The motor testing reveals 5 over 5 strength of all 4 extremities. Good symmetric motor tone is noted throughout. Mild tremor in the right hand usually occurring at rest. Sensory: Sensory testing is intact to soft touch on all 4 extremities. No evidence of extinction is noted.  Coordination: Cerebellar testing reveals good finger-nose-finger and heel-to-shin bilaterally.  Gait and station: Patient is able to stand without assistance. Patient has good stride. Slightly decreased arm swing on the right however he contributes this to bursitis in the right shoulder. Good turns. Reflexes: Deep tendon reflexes are symmetric and normal bilaterally.   DIAGNOSTIC DATA (LABS, IMAGING, TESTING) - I reviewed patient records, labs, notes, testing and imaging myself where available.  Lab Results  Component Value Date   WBC 9.5 01/21/2014   HGB 12.2 (L) 01/21/2014    HCT 35.7 (L) 01/21/2014   MCV 90.6 01/21/2014   PLT 155 01/21/2014      Component Value Date/Time   NA 142 07/10/2015 0835   K 4.4 07/10/2015 0835   CL 99 07/10/2015 0835   CO2 24 07/10/2015 0835   GLUCOSE 90 07/10/2015 0835   GLUCOSE 81 04/12/2014 1454   BUN 22 07/10/2015 0835   CREATININE 1.20 07/10/2015 0835   CREATININE 1.13 04/12/2014 1454   CALCIUM 10.0 07/10/2015 0835   PROT 6.5 04/12/2014 1454   ALBUMIN 3.8 04/12/2014 1454   AST 15 04/12/2014 1454   ALT 16 04/12/2014 1454   ALKPHOS 55 04/12/2014 1454   BILITOT 0.4 04/12/2014 1454   GFRNONAA 60 07/10/2015 0835   GFRNONAA 65 04/12/2014 1454   GFRAA 69 07/10/2015 0835   GFRAA 75 04/12/2014 1454   Lab Results  Component Value Date   CHOL 128 07/10/2015   HDL 52 07/10/2015   LDLCALC 58 07/10/2015   TRIG 88 07/10/2015   CHOLHDL 2.5 07/10/2015   Lab Results  Component Value Date   HGBA1C 5.8 (H) 01/11/2014   Lab Results  Component Value Date   VITAMINB12 550 04/12/2014   Lab Results  Component Value Date   TSH 1.094 11/01/2014      ASSESSMENT AND PLAN 76 y.o. 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 here with:  1. Memory disturbance  The patient's memory score has slightly declined. He will continue on Aricept 10 mg daily. We will start patient on Namenda. I have reviewed the side effects with the patient as well as provided him with a handout. Patient does have a mild resting tremor in the right hand. We will continue to monitor. Patient advised that if his symptoms worsen or he develops new symptoms he should let us know. Will follow-up in 6 months or sooner if needed.     Ward Givens, MSN, NP-C 06/17/2016, 3:06 PM [facility]  [place], [place] [place], [place] [address] [phone]

## 2016-06-17 NOTE — Progress Notes (Signed)
I have read the note, and I agree with the clinical assessment and plan.  Stasia Somero KEITH   

## 2016-06-26 DIAGNOSIS — X32XXXD Exposure to sunlight, subsequent encounter: Secondary | ICD-10-CM | POA: Diagnosis not present

## 2016-06-26 DIAGNOSIS — L570 Actinic keratosis: Secondary | ICD-10-CM | POA: Diagnosis not present

## 2016-06-26 DIAGNOSIS — L298 Other pruritus: Secondary | ICD-10-CM | POA: Diagnosis not present

## 2016-07-01 ENCOUNTER — Other Ambulatory Visit: Payer: Self-pay | Admitting: Neurology

## 2016-07-02 NOTE — Telephone Encounter (Signed)
Rx printed, signed, faxed to pharmacy. 

## 2016-07-10 ENCOUNTER — Telehealth: Payer: Self-pay | Admitting: Adult Health

## 2016-07-10 MED ORDER — MEMANTINE HCL 10 MG PO TABS
10.0000 mg | ORAL_TABLET | Freq: Two times a day (BID) | ORAL | 3 refills | Status: DC
Start: 2016-07-10 — End: 2017-07-07

## 2016-07-10 NOTE — Telephone Encounter (Addendum)
Pt request RX for mamentine 90 day supply sent to [facility]. Said he's not had any side effects. He also want to clarify if he continues to take donepezil (ARICEPT) 10 MG tablet .

## 2016-07-10 NOTE — Telephone Encounter (Signed)
Spoke to pt and relayed that yes he is to take both donepezil and memantine .  Will call in maintenance dose 10mg  po bid for memantine at the [facility].

## 2016-08-18 DIAGNOSIS — H40013 Open angle with borderline findings, low risk, bilateral: Secondary | ICD-10-CM | POA: Diagnosis not present

## 2016-08-18 DIAGNOSIS — Z961 Presence of intraocular lens: Secondary | ICD-10-CM | POA: Diagnosis not present

## 2016-08-18 DIAGNOSIS — H532 Diplopia: Secondary | ICD-10-CM | POA: Diagnosis not present

## 2016-09-10 ENCOUNTER — Ambulatory Visit (INDEPENDENT_AMBULATORY_CARE_PROVIDER_SITE_OTHER): Payer: Medicare Other | Admitting: Internal Medicine

## 2016-09-10 ENCOUNTER — Encounter: Payer: Self-pay | Admitting: Internal Medicine

## 2016-09-10 VITALS — BP 108/62 | HR 63 | Temp 97.4°F | Ht 66.0 in | Wt 132.3 lb

## 2016-09-10 DIAGNOSIS — M5441 Lumbago with sciatica, right side: Secondary | ICD-10-CM | POA: Diagnosis not present

## 2016-09-10 DIAGNOSIS — E785 Hyperlipidemia, unspecified: Secondary | ICD-10-CM

## 2016-09-10 DIAGNOSIS — G3184 Mild cognitive impairment, so stated: Secondary | ICD-10-CM

## 2016-09-10 DIAGNOSIS — G8929 Other chronic pain: Secondary | ICD-10-CM | POA: Diagnosis not present

## 2016-09-10 DIAGNOSIS — N182 Chronic kidney disease, stage 2 (mild): Secondary | ICD-10-CM

## 2016-09-10 DIAGNOSIS — Z87891 Personal history of nicotine dependence: Secondary | ICD-10-CM | POA: Diagnosis not present

## 2016-09-10 DIAGNOSIS — L299 Pruritus, unspecified: Secondary | ICD-10-CM

## 2016-09-10 DIAGNOSIS — Z79899 Other long term (current) drug therapy: Secondary | ICD-10-CM | POA: Diagnosis not present

## 2016-09-10 DIAGNOSIS — J449 Chronic obstructive pulmonary disease, unspecified: Secondary | ICD-10-CM | POA: Diagnosis present

## 2016-09-10 DIAGNOSIS — H6123 Impacted cerumen, bilateral: Secondary | ICD-10-CM

## 2016-09-10 DIAGNOSIS — H6122 Impacted cerumen, left ear: Secondary | ICD-10-CM | POA: Insufficient documentation

## 2016-09-10 NOTE — Assessment & Plan Note (Signed)
History of present illness: He reports the sensation of water in his left ear over the last several days. This is mildly bothersome.  Does not use Q-tips.  Assessment bilateral impacted cerumen  Plan We attempted to irrigate the area. he does not have full relief.  [name], or warm water.  [name] one more week if not improved and we can refer him to ENT for instrumentation removal.

## 2016-09-10 NOTE — Assessment & Plan Note (Signed)
History of present illness: He has been taking his atorvastatin he has no complaints.  Assessment: hyperlipidemia  Plan: Repeat lipid panel, continue atorvastatin 40.

## 2016-09-10 NOTE — Patient Instructions (Signed)
I will call with the results of your labwork. 

## 2016-09-10 NOTE — Assessment & Plan Note (Signed)
HPI: He reports she has not needed to use albuterol at all since her last visit. He feels like his breathing is doing very well.  Of note her PFTs are from 2011 he reports he stopped smoking 2012.  Assessment COPD due to history of tobacco use  Plan Continue albuterol as needed, however I would like to repeat his PFTs to see exactly where his lung function stands.

## 2016-09-10 NOTE — Assessment & Plan Note (Addendum)
A; CKD stage 2  P: Check renal function with CMP Check CBC

## 2016-09-10 NOTE — Addendum Note (Signed)
Addended by: Joni Reining C on: 09/10/2016 05:04 PM   Modules accepted: Orders

## 2016-09-10 NOTE — Progress Notes (Signed)
[facility] Subjective:  HPI: [name] is a 76 y.o. male who presents for follow up of MCI, HTN, COPD   Please see assessment and plan below for the status of his chronic medical problems.  Review of Systems: Per HPI Objective:  Physical Exam: Vitals:   09/10/16 1047  BP: 108/62  Pulse: 63  Temp: 97.4 F (36.3 C)  TempSrc: Oral  SpO2: 99%  Weight: 132 lb 4.8 oz (60 kg)  Height: 5\' 6"  (1.676 m)   Physical Exam  Constitutional: He is oriented to person, place, and time and well-developed, well-nourished, and in no distress.  Eyes: Conjunctivae are normal.  Cardiovascular: Normal rate and regular rhythm.   Pulmonary/Chest: Effort normal. He has no wheezes.  Abdominal: Soft. Bowel sounds are normal.  Musculoskeletal:       Lumbar back: He exhibits tenderness. He exhibits no bony tenderness and no spasm.  Neurological: He is alert and oriented to person, place, and time.  Nursing note and vitals reviewed.   Assessment & Plan:  COPD, moderate ([place]) HPI: He reports she has not needed to use albuterol at all since her last visit. He feels like his breathing is doing very well.  Of note her PFTs are from 2011 he reports he stopped smoking 2012.  Assessment COPD due to history of tobacco use  Plan Continue albuterol as needed, however I would like to repeat his PFTs to see exactly where his lung function stands.  Mild cognitive impairment with memory loss History of present illness: Had his last follow-up with his neurologist he was noted to have a small decline, therefore Namenda has been started. He reports she's been tolerating this medication well. He continues to have occasional drooling and runny nose with Aricept but it is not very bothersome. He otherwise feels that he's doing well.  Assessment, mild cognitive impairment  Plan: Continue Aricept and Namenda.  Bilateral impacted cerumen History of present illness: He reports the sensation  of water in his left ear over the last several days. This is mildly bothersome.  Does not use Q-tips.  Assessment bilateral impacted cerumen  Plan We attempted to irrigate the area. he does not have full relief.  [name], or warm water.  [name] one more week if not improved and we can refer him to ENT for instrumentation removal.  CKD (chronic kidney disease) stage 2, GFR 60-89 ml/min A; CKD stage 2  P: Check renal function with CMP Check CBC  Hyperlipidemia History of present illness: He has been taking his atorvastatin he has no complaints.  Assessment: hyperlipidemia  Plan: Repeat lipid panel, continue atorvastatin 40.    Itching History of present illness: [name] multiple times by his dermatologist Dr. Nevada Crane for some generalized pruritus. He reports he has tried multiple different ointments and lotions that were recommended by Dr. Nevada Crane.  He is a little frustrated and would like to get a second opinion from another dermatologist.  Assessment generalized pruritus  Plan At our last visit I wanted to obtain records, it doesn't appear that this happened apparently said do not see them in the system. Therefore I will again try to obtain records from Dr. Nevada Crane so that we may know what has been tried. I will also place a referral for him to get a second opinion.  Chronic low back pain with right-sided sciatica History of present illness he's been following with Dr.Gioffre and he  has been doing exercises and walking around as tolerated he does have some symptoms of neurologic claudication. He has been using his temporary handicap placard and he would like this to be renewed.   A:Chronic low back pain with sciatica  P:Given it has been over 6 months I will go ahead and recommend a more permanent handicap placard.    Medications Ordered No orders of the defined types were placed in this encounter.  Other Orders Orders Placed This  Encounter  Procedures  . CMP14 + Anion Gap  . CBC with Diff  . Lipid Profile  . Ambulatory referral to Dermatology    Referral Priority:   Routine    Referral Type:   Consultation    Referral Reason:   Second Opinion    Requested Specialty:   Dermatology    Number of Visits Requested:   1   Follow Up: Return in about 6 months (around 03/12/2017).

## 2016-09-10 NOTE — Progress Notes (Signed)
BILATERAL EAR IRRIGATION WITH GOOD RESULTS FROM RIGHT EAR. PATIENT STATES HE IS ABLE TO HEAR MUCH BETTER OUT OF RIGHT EAR. PATIENT STATES LEFT EAR STILL FEEL STOPPED UP.  PATIENT WAS INSTRUCTED TO CALL THE CLINIC Monday TO BE SEEN IF STILL HAVING THIS PROBLEM.  PATIENT WAS ALSO EVALUATED BY SHARON POWERS RN.

## 2016-09-10 NOTE — Assessment & Plan Note (Signed)
History of present illness: [name] multiple times by his dermatologist Dr. Nevada Crane for some generalized pruritus. He reports he has tried multiple different ointments and lotions that were recommended by Dr. Nevada Crane.  He is a little frustrated and would like to get a second opinion from another dermatologist.  Assessment generalized pruritus  Plan At our last visit I wanted to obtain records, it doesn't appear that this happened apparently said do not see them in the system. Therefore I will again try to obtain records from Dr. Nevada Crane so that we may know what has been tried. I will also place a referral for him to get a second opinion.

## 2016-09-10 NOTE — Assessment & Plan Note (Signed)
History of present illness he's been following with Dr.Gioffre and he has been doing exercises and walking around as tolerated he does have some symptoms of neurologic claudication. He has been using his temporary handicap placard and he would like this to be renewed.   A:Chronic low back pain with sciatica  P:Given it has been over 6 months I will go ahead and recommend a more permanent handicap placard.

## 2016-09-10 NOTE — Assessment & Plan Note (Signed)
History of present illness: Had his last follow-up with his neurologist he was noted to have a small decline, therefore Namenda has been started. He reports she's been tolerating this medication well. He continues to have occasional drooling and runny nose with Aricept but it is not very bothersome. He otherwise feels that he's doing well.  Assessment, mild cognitive impairment  Plan: Continue Aricept and Namenda.

## 2016-09-11 ENCOUNTER — Encounter: Payer: Self-pay | Admitting: Internal Medicine

## 2016-09-11 LAB — CBC WITH DIFFERENTIAL/PLATELET
BASOS ABS: 0 10*3/uL (ref 0.0–0.2)
Basos: 0 %
EOS (ABSOLUTE): 0.1 10*3/uL (ref 0.0–0.4)
Eos: 1 %
HEMOGLOBIN: 15.7 g/dL (ref 13.0–17.7)
Hematocrit: 44.9 % (ref 37.5–51.0)
IMMATURE GRANS (ABS): 0 10*3/uL (ref 0.0–0.1)
IMMATURE GRANULOCYTES: 0 %
LYMPHS: 20 %
Lymphocytes Absolute: 1.5 10*3/uL (ref 0.7–3.1)
MCH: 31.5 pg (ref 26.6–33.0)
MCHC: 35 g/dL (ref 31.5–35.7)
MCV: 90 fL (ref 79–97)
MONOCYTES: 8 %
Monocytes Absolute: 0.6 10*3/uL (ref 0.1–0.9)
NEUTROS ABS: 5.3 10*3/uL (ref 1.4–7.0)
NEUTROS PCT: 71 %
PLATELETS: 154 10*3/uL (ref 150–379)
RBC: 4.98 x10E6/uL (ref 4.14–5.80)
RDW: 13.1 % (ref 12.3–15.4)
WBC: 7.6 10*3/uL (ref 3.4–10.8)

## 2016-09-11 LAB — CMP14 + ANION GAP
ALT: 35 IU/L (ref 0–44)
ANION GAP: 14 mmol/L (ref 10.0–18.0)
AST: 16 IU/L (ref 0–40)
Albumin/Globulin Ratio: 2.1 (ref 1.2–2.2)
Albumin: 4.1 g/dL (ref 3.5–4.8)
Alkaline Phosphatase: 78 IU/L (ref 39–117)
BILIRUBIN TOTAL: 0.7 mg/dL (ref 0.0–1.2)
BUN/Creatinine Ratio: 14 (ref 10–24)
BUN: 17 mg/dL (ref 8–27)
CALCIUM: 9.5 mg/dL (ref 8.6–10.2)
CHLORIDE: 103 mmol/L (ref 96–106)
CO2: 27 mmol/L (ref 18–29)
Creatinine, Ser: 1.24 mg/dL (ref 0.76–1.27)
GFR, EST AFRICAN AMERICAN: 65 mL/min/{1.73_m2} (ref >59)
GFR, EST NON AFRICAN AMERICAN: 57 mL/min/{1.73_m2} — AB (ref >59)
GLUCOSE: 94 mg/dL (ref 65–99)
Globulin, Total: 2 g/dL (ref 1.5–4.5)
POTASSIUM: 4.6 mmol/L (ref 3.5–5.2)
Sodium: 144 mmol/L (ref 134–144)
TOTAL PROTEIN: 6.1 g/dL (ref 6.0–8.5)

## 2016-09-11 LAB — LIPID PANEL
CHOLESTEROL TOTAL: 124 mg/dL (ref 100–199)
Chol/HDL Ratio: 2.5 ratio (ref 0.0–5.0)
HDL: 50 mg/dL (ref >39)
LDL CALC: 56 mg/dL (ref 0–99)
Triglycerides: 89 mg/dL (ref 0–149)
VLDL CHOLESTEROL CAL: 18 mg/dL (ref 5–40)

## 2016-09-16 ENCOUNTER — Ambulatory Visit (INDEPENDENT_AMBULATORY_CARE_PROVIDER_SITE_OTHER): Payer: Medicare Other | Admitting: Internal Medicine

## 2016-09-16 ENCOUNTER — Encounter: Payer: Self-pay | Admitting: Internal Medicine

## 2016-09-16 VITALS — BP 110/59 | HR 77 | Temp 98.0°F | Ht 66.0 in | Wt 131.1 lb

## 2016-09-16 DIAGNOSIS — H6122 Impacted cerumen, left ear: Secondary | ICD-10-CM

## 2016-09-16 DIAGNOSIS — Z87891 Personal history of nicotine dependence: Secondary | ICD-10-CM

## 2016-09-16 NOTE — Progress Notes (Signed)
   CC: Follow-up for cerumen impaction  HPI:  [name] is a 76 y.o. man with past medical history as noted below who presents today for follow-up of cerumen impaction.  He was seen by his PCP on 4/5 and had noted the sensation of water in his left ear. Irrigation was attempted, but patient did not have full relief. He was recommended to use Debrox or warm water and to return to clinic in 1 week if he still did not have any relief. Today, patient reports his left ear still feels like it is under water. He has tried warm water and no oily substance, but was unable to get any earwax out. He denies any ear pain.  Past Medical History:  Diagnosis Date  . 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 Colonic polyp    Tubulovillous adenomatous  . COPD (chronic obstructive pulmonary disease) ([place])   . Delirium, acute 2015   After anesthesia for colectomy - resolved in 48hrs  . GERD (gastroesophageal reflux disease)   . Gout 12/2007   inadequate aspiration of MTP joint left great toe  06/18/08; -fluid:  red, bloody, cell count not performed (TNP) and no crystals or organisms seen.  . Hyperlipidemia   . Insomnia   . PERFORATION, INTESTINE 06/19/2006   Annotation: During colonoscopy Qualifier: History of  By: Oretha Ellis    . Tobacco abuse    quit 2001    Review of Systems:   General: Denies fever, chills, night sweats, changes in weight, changes in appetite HEENT: Denies headaches, changes in vision, rhinorrhea, sore throat CV: Denies CP, palpitations, SOB, orthopnea Pulm: Denies SOB, cough, wheezing GI: Denies abdominal pain, nausea, vomiting, diarrhea, constipation, melena, hematochezia GU: Denies dysuria, hematuria, frequency Msk: Denies muscle cramps, joint pains Neuro: Denies weakness, numbness, tingling Skin: Denies rashes, bruising Psych: Denies depression, anxiety, hallucinations  Physical Exam:  Vitals:   09/16/16 1425  BP: (!) 110/59  Pulse: 77  Temp: 98 F (36.7 C)  TempSrc: Oral    SpO2: 99%  Weight: 131 lb 1.6 oz (59.5 kg)  Height: 5\' 6"  (1.676 m)   General: Pleasant, elderly man in NAD HEENT: Right ear canal appears normal, minimal cerumen, tympanic membrane appears normal. Left ear with a large cerumen burden, blocking 90% of ear canal.   Assessment & Plan:   See Encounters Tab for problem based charting.  Patient discussed with Dr. Dareen Piano

## 2016-09-16 NOTE — Patient Instructions (Signed)
General Instructions: - Referral to ENT made   Please bring your medicines with you each time you come to clinic.  Medicines may include prescription medications, over-the-counter medications, herbal remedies, eye drops, vitamins, or other pills.   Progress Toward Treatment Goals:  No flowsheet data found.  Self Care Goals & Plans:  No flowsheet data found.  No flowsheet data found.   Care Management & Community Referrals:  No flowsheet data found.

## 2016-09-16 NOTE — Assessment & Plan Note (Addendum)
Patient continues to have cerumen impaction on the left side despite trying irrigation in the office and at home. We tried irrigation with warm water again in the office and were able to remove a good portion of the cerumen impaction. Patient reports "70% relief" with some improvement in his hearing. His left ear canal was examined again and there is still a large cerumen burden. Will refer patient to ENT for instrumental cerumen removal.

## 2016-09-22 NOTE — Progress Notes (Signed)
Internal Medicine Clinic Attending  Case discussed with Dr. Rivet soon after the resident saw the patient.  We reviewed the resident's history and exam and pertinent patient test results.  I agree with the assessment, diagnosis, and plan of care documented in the resident's note.  

## 2016-09-24 ENCOUNTER — Ambulatory Visit (HOSPITAL_COMMUNITY)
Admission: RE | Admit: 2016-09-24 | Discharge: 2016-09-24 | Disposition: A | Discharge status: Home / Self Care | Payer: Medicare Other | Source: Ambulatory Visit | Attending: Internal Medicine | Admitting: Internal Medicine

## 2016-09-24 DIAGNOSIS — J449 Chronic obstructive pulmonary disease, unspecified: Secondary | ICD-10-CM | POA: Insufficient documentation

## 2016-09-24 DIAGNOSIS — Z570 Occupational exposure to noise: Secondary | ICD-10-CM | POA: Diagnosis not present

## 2016-09-24 DIAGNOSIS — H6122 Impacted cerumen, left ear: Secondary | ICD-10-CM | POA: Diagnosis not present

## 2016-09-24 DIAGNOSIS — H9012 Conductive hearing loss, unilateral, left ear, with unrestricted hearing on the contralateral side: Secondary | ICD-10-CM | POA: Diagnosis not present

## 2016-09-24 LAB — PULMONARY FUNCTION TEST
DL/VA % PRED: 67 %
DL/VA: 2.92 ml/min/mmHg/L
DLCO COR: 12.8 ml/min/mmHg
DLCO UNC % PRED: 48 %
DLCO cor % pred: 47 %
DLCO unc: 13.01 ml/min/mmHg
FEF 25-75 PRE: 0.4 L/s
FEF 25-75 Post: 0.54 L/sec
FEF2575-%CHANGE-POST: 35 %
FEF2575-%PRED-PRE: 21 %
FEF2575-%Pred-Post: 29 %
FEV1-%Change-Post: 8 %
FEV1-%PRED-PRE: 62 %
FEV1-%Pred-Post: 67 %
FEV1-POST: 1.74 L
FEV1-PRE: 1.6 L
FEV1FVC-%Change-Post: 10 %
FEV1FVC-%Pred-Pre: 65 %
FEV6-%CHANGE-POST: 7 %
FEV6-%PRED-PRE: 82 %
FEV6-%Pred-Post: 88 %
FEV6-POST: 2.95 L
FEV6-PRE: 2.74 L
FEV6FVC-%Change-Post: 9 %
FEV6FVC-%PRED-POST: 96 %
FEV6FVC-%PRED-PRE: 87 %
FVC-%Change-Post: -1 %
FVC-%PRED-POST: 91 %
FVC-%Pred-Pre: 93 %
FVC-POST: 3.29 L
FVC-Pre: 3.36 L
POST FEV6/FVC RATIO: 90 %
PRE FEV1/FVC RATIO: 48 %
Post FEV1/FVC ratio: 53 %
Pre FEV6/FVC Ratio: 82 %
RV % PRED: 89 %
RV: 2.1 L
TLC % PRED: 86 %
TLC: 5.39 L

## 2016-09-24 MED ORDER — ALBUTEROL SULFATE (2.5 MG/3ML) 0.083% IN NEBU
2.5000 mg | INHALATION_SOLUTION | Freq: Once | RESPIRATORY_TRACT | Status: AC
  Administered 2016-09-24: 2.5 mg via RESPIRATORY_TRACT

## 2016-10-07 DIAGNOSIS — L298 Other pruritus: Secondary | ICD-10-CM | POA: Diagnosis not present

## 2016-10-08 ENCOUNTER — Telehealth: Payer: Self-pay | Admitting: Adult Health

## 2016-10-08 NOTE — Telephone Encounter (Signed)
Spoke to pt and he relayed that he has an itch, has tried multiple ointments and no help, so dermatologist recommended this.  I asked if it was cetirizine for allergies (generic zyrtec) he said yes.  I relayed that from my drug interaction app it did not show any interactions.  I did relay that donezepil does cause runny nose and taking the cetirizine may not help this.  He said is was for itching.  He will call back as needed.

## 2016-10-08 NOTE — Telephone Encounter (Signed)
Pt called the office said dermatologist has prescribed cetinizine 10mg  2 x day and is wanting to know if there will be any interaction with memantine and donepezil. I advised him to call pharmacy also.

## 2016-12-17 ENCOUNTER — Ambulatory Visit: Payer: Medicare Other | Admitting: Adult Health

## 2016-12-17 ENCOUNTER — Ambulatory Visit (INDEPENDENT_AMBULATORY_CARE_PROVIDER_SITE_OTHER): Payer: Medicare Other | Admitting: Adult Health

## 2016-12-17 ENCOUNTER — Encounter: Payer: Self-pay | Admitting: Adult Health

## 2016-12-17 ENCOUNTER — Telehealth: Payer: Self-pay | Admitting: Adult Health

## 2016-12-17 VITALS — BP 127/70 | HR 65 | Wt 130.8 lb

## 2016-12-17 DIAGNOSIS — R413 Other amnesia: Secondary | ICD-10-CM | POA: Diagnosis not present

## 2016-12-17 DIAGNOSIS — L299 Pruritus, unspecified: Secondary | ICD-10-CM

## 2016-12-17 NOTE — Progress Notes (Signed)
PATIENT: [name] DOB: [birth year]  REASON FOR VISIT: follow up- memory HISTORY FROM: patient  HISTORY OF PRESENT ILLNESS: [name] is a 76 year old male with a history of mild memory disturbance. He returns today for follow-up. He is currently on Aricept and Namenda and tolerating those well. He continues to live at home alone. He is able to complete all ADLs and apparently. He doesn't operate a motor vehicle states that he typically does not drive long distances. He handles his own finances and prepares all meals without difficulty. He states that he has been having chronic pruritus. This occurs in various locations throughout the day. He is seen to dermatologist and was prescribed topical and oral medication with no benefit. He states his dermatologist recommended that he see a neurologist. He returns today for follow-up.    HISTORY 06/17/16: [name] is a 76 year old male with a history of mild memory disturbance. He returns today for follow-up. He is currently taking Aricept 10 mg at bedtime. He feels that his memory has gotten slightly worse. He lives at home alone. He is able to complete all ADLs. He reports that he handles his own finances however he has noticed that he has trouble keeping up. He denies missing any payments. He states that he does prepare all his meals. He has occasionally burned tea that he was cooking on the stove. The patient operates a motor vehicle. He does state that he uses a GPS to avoid getting lost. Denies any trouble sleeping. Denies any changes in mood or behavior. The patient does have some family nearby. His children live in [place]. He does participate in the widowed group weekly. He also has dinner with his aunt once a week. He was going to the [facility] and participating in an exercise class however he states that since his back injury he has not been going to this. Patient does report that he has a tremor intermittently in the right hand. He states  that it normally occurs at rest. He returns today for an evaluation.  HISTORY 12/16/15 (WILLIS): [name] is a 76 year old right-handed white male with a history of a mild memory disturbance, the patient has been on Aricept taking 10 mg at night and tolerating the medication fairly well. He does have a runny nose and some occasional drooling. The patient recently has had some issues with low back discomfort. The patient is being followed by [facility], he has had a MRI of the low back, but the results are pending. He reports pain in the back with walking, better with rest. The pain may go down both legs. He has not had any falls or significant weakness in the legs. He denies issues controlling the bowels or the bladder. He does report some itching sensations throughout the body, he has been seen by a dermatologist for this. He comes to this office for an evaluation.   REVIEW OF SYSTEMS: Out of a complete 14 system review of symptoms, the patient complains only of the following symptoms, and all other reviewed systems are negative.  Runny nose, drooling, back pain, walking difficulty, memory loss, dizziness, tremors  ALLERGIES: Allergies  Allergen Reactions  . Ambien [Zolpidem Tartrate] Other (See Comments)    paranoia    HOME MEDICATIONS: Outpatient Medications Prior to Visit  Medication Sig Dispense Refill  . albuterol (PROVENTIL HFA;VENTOLIN HFA) 108 (90 Base) MCG/ACT inhaler Inhale 1-2 puffs into the lungs every 6 (six) hours as needed for wheezing or shortness of  breath.    . ALPRAZolam (XANAX) 0.5 MG tablet TAKE ONE TABLET BY MOUTH AT BEDTIME AS NEEDED FOR ANXIETY 90 tablet 1  . aspirin 81 MG tablet Take 81 mg by mouth every evening.     [name] atorvastatin (LIPITOR) 40 MG tablet Take 1 tablet (40 mg total) by mouth daily. 90 tablet 3  . cetirizine (ZYRTEC) 10 MG tablet Take 10 mg by mouth 2 (two) times daily.    [name] donepezil (ARICEPT) 10 MG tablet Take 1 tablet (10 mg total) by  mouth at bedtime. 90 tablet 3  . memantine (NAMENDA) 10 MG tablet Take 1 tablet (10 mg total) by mouth 2 (two) times daily. 180 tablet 3  . Multiple Vitamin (MULTIVITAMIN) tablet Take 1 tablet by mouth daily.    . naproxen sodium (ANAPROX) 220 MG tablet Take 220 mg by mouth as needed.    . Omega 3 1000 MG CAPS Take 1 capsule by mouth daily.    . pantoprazole (PROTONIX) 40 MG tablet TAKE ONE TABLET BY MOUTH ONCE DAILY 90 tablet 3  . Polyethyl Glycol-Propyl Glycol (SYSTANE) 0.4-0.3 % SOLN Place 1 drop into both eyes 2 (two) times daily.    . tamsulosin (FLOMAX) 0.4 MG CAPS capsule Take 1 capsule (0.4 mg total) by mouth daily. 90 capsule 1  . triamcinolone cream (KENALOG) 0.1 %     . triamcinolone cream (KENALOG) 0.5 % Apply 1 application topically 3 (three) times daily.     No facility-administered medications prior to visit.     PAST MEDICAL HISTORY: Past Medical History:  Diagnosis Date  . 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 Colonic polyp    Tubulovillous adenomatous  . COPD (chronic obstructive pulmonary disease) ([place])   . Delirium, acute 2015   After anesthesia for colectomy - resolved in 48hrs  . GERD (gastroesophageal reflux disease)   . Gout 12/2007   inadequate aspiration of MTP joint left great toe  06/18/08; -fluid:  red, bloody, cell count not performed (TNP) and no crystals or organisms seen.  . Hyperlipidemia   . Insomnia   . PERFORATION, INTESTINE 06/19/2006   Annotation: During colonoscopy Qualifier: History of  By: Oretha Ellis    . Tobacco abuse    quit 2001    PAST SURGICAL HISTORY: Past Surgical History:  Procedure Laterality Date  . CATARACT EXTRACTION Bilateral   . COLONOSCOPY    . DUPUYTREN CONTRACTURE RELEASE Right   . LAPAROSCOPIC PARTIAL COLECTOMY Right 01/18/2014   Procedure: LAPAROSCOPIC PARTIAL RIGHT COLECTOMY;  Surgeon: Adin Hector, MD;  Location: [facility];  Service: General;  Laterality: Right;  . NECK SURGERY  1970   removal cyst outside neck  . WRIST  SURGERY Left     FAMILY HISTORY: Family History  Problem Relation Age of Onset  . Cerebral aneurysm Mother   . Cancer Father        lung cancer/asbestos  . Colon cancer Neg Hx   . Pancreatic cancer Neg Hx   . Stomach cancer Neg Hx   . Rectal cancer Neg Hx     SOCIAL HISTORY: Social History   Social History  . Marital status: Widowed    Spouse name: N/A  . Number of children: 1  . Years of education: 91   Occupational History  . Retired Retired   Social History Main Topics  . Smoking status: Former Smoker    Types: Cigarettes    Quit date: 08/27/2000  . Smokeless tobacco: Never Used  . Alcohol use No  Comment: none x 7 years  . Drug use: No  . Sexual activity: Not on file   Other Topics Concern  . Not on file   Social History Narrative   Lives at home alone   Patient is right handed.   Patient drinks about 2-3 glasses of tea daily      PHYSICAL EXAM  Vitals:   12/17/16 1502  BP: 127/70  Pulse: 65  Weight: 130 lb 12.8 oz (59.3 kg)   Body mass index is 21.11 kg/m. MMSE - Mini Mental State Exam 12/17/2016 06/17/2016 12/16/2015  Orientation to time 5 4 5   Orientation to Place 5 5 5   Registration 3 3 3   Attention/ Calculation 5 3 5   Recall 3 3 2   Language- name 2 objects 2 2 2   Language- repeat 1 1 1   Language- follow 3 step command 3 3 3   Language- read & follow direction 1 1 1   Write a sentence 1 1 1   Copy design 0 0 1  Total score 29 26 29     Generalized: Well developed, in no acute distress   Neurological examination  Mentation: Alert oriented to time, place, history taking. Follows all commands speech and language fluent Cranial nerve II-XII: Pupils were equal round reactive to light. Extraocular movements were full, visual field were full on confrontational test. Facial sensation and strength were normal. Uvula tongue midline. Head turning and shoulder shrug  were normal and symmetric. Motor: The motor testing reveals 5 over 5 strength of  all 4 extremities. Good symmetric motor tone is noted throughout.  Sensory: Sensory testing is intact to soft touch on all 4 extremities. No evidence of extinction is noted.  Coordination: Cerebellar testing reveals good finger-nose-finger and heel-to-shin bilaterally.  Gait and station: Gait is normal. Tandem gait is normal. Romberg is negative. No drift is seen.  Reflexes: Deep tendon reflexes are symmetric and normal bilaterally.   DIAGNOSTIC DATA (LABS, IMAGING, TESTING) - I reviewed patient records, labs, notes, testing and imaging myself where available.  Lab Results  Component Value Date   WBC 7.6 09/10/2016   HGB 15.7 09/10/2016   HCT 44.9 09/10/2016   MCV 90 09/10/2016   PLT 154 09/10/2016      Component Value Date/Time   NA 144 09/10/2016 1131   K 4.6 09/10/2016 1131   CL 103 09/10/2016 1131   CO2 27 09/10/2016 1131   GLUCOSE 94 09/10/2016 1131   GLUCOSE 81 04/12/2014 1454   BUN 17 09/10/2016 1131   CREATININE 1.24 09/10/2016 1131   CREATININE 1.13 04/12/2014 1454   CALCIUM 9.5 09/10/2016 1131   PROT 6.1 09/10/2016 1131   ALBUMIN 4.1 09/10/2016 1131   AST 16 09/10/2016 1131   ALT 35 09/10/2016 1131   ALKPHOS 78 09/10/2016 1131   BILITOT 0.7 09/10/2016 1131   GFRNONAA 57 (L) 09/10/2016 1131   GFRNONAA 65 04/12/2014 1454   GFRAA 65 09/10/2016 1131   GFRAA 75 04/12/2014 1454   Lab Results  Component Value Date   CHOL 124 09/10/2016   HDL 50 09/10/2016   LDLCALC 56 09/10/2016   TRIG 89 09/10/2016   CHOLHDL 2.5 09/10/2016   Lab Results  Component Value Date   HGBA1C 5.8 (H) 01/11/2014   Lab Results  Component Value Date   VITAMINB12 550 04/12/2014   Lab Results  Component Value Date   TSH 1.094 11/01/2014      ASSESSMENT AND PLAN 76 y.o. 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 here with:  1. Memory disturbance 2. Chronic pruritus  The patient's memory score has remained stable. He'll continue on Aricept and Namenda. In regards to chronic pruritus I recommended that we could do a trial on gabapentin. Starting with 100 mg daily for the first week and if tolerating well can increase to 100 mg in the morning and mid afternoon thereafter. I reviewed the side effects of gabapentin with the patient. He states that he is unsure of the oral medication that the dermatologist gave him. He would like to check his prescription at home to ensure that this is not the same medication. He will call us back with this information. Patient advised that if his symptoms worsen or he develops new symptoms he should let us know. He will follow-up in 6 months or sooner if needed.     Ward Givens, MSN, NP-C 12/17/2016, 3:46 PM [facility] [address], [place], [place] [address] [phone]

## 2016-12-17 NOTE — Telephone Encounter (Signed)
Patient called office in reference to give information that he has been prescribed gabapentin 100mg  2 times daily patient completed medication last week.  Patient states he does not want any more of the gabapentin 100mg  and would like to see if he can be prescribed something different.  Please call

## 2016-12-17 NOTE — Patient Instructions (Addendum)
Your Plan:  Continue Aricept and Namenda Check at home to see what medication the dermatologist prescribed Considering starting gabapentin 100 mg daily for 1 week then increasing to 1 tablet in the morning and mid afternoon thereafter. Providing that this is not the medication that dermatologist prescribe  If your symptoms worsen or you develop new symptoms please let us know. '  Thank you for coming to see [place] at [facility]. I hope we have been able to provide you high quality care today.  You may receive a patient satisfaction survey over the next few weeks. We would appreciate your feedback and comments so that we may continue to improve ourselves and the health of our patients.   Gabapentin capsules or tablets What is this medicine? GABAPENTIN (GA ba pen tin) is used to control partial seizures in adults with epilepsy. It is also used to treat certain types of nerve pain. This medicine may be used for other purposes; ask your health care provider or pharmacist if you have questions. COMMON BRAND NAME(S): Active-PAC with Gabapentin, Gabarone, Neurontin What should I tell my health care provider before I take this medicine? They need to know if you have any of these conditions: -kidney disease -suicidal thoughts, plans, or attempt; a previous suicide attempt by you or a family member -an unusual or allergic reaction to gabapentin, other medicines, foods, dyes, or preservatives -pregnant or trying to get pregnant -breast-feeding How should I use this medicine? Take this medicine by mouth with a glass of water. Follow the directions on the prescription label. You can take it with or without food. If it upsets your stomach, take it with food.Take your medicine at regular intervals. Do not take it more often than directed. Do not stop taking except on your doctor's advice. If you are directed to break the 600 or 800 mg tablets in half as part of your dose, the extra half  tablet should be used for the next dose. If you have not used the extra half tablet within 28 days, it should be thrown away. A special MedGuide will be given to you by the pharmacist with each prescription and refill. Be sure to read this information carefully each time. Talk to your pediatrician regarding the use of this medicine in children. Special care may be needed. Overdosage: If you think you have taken too much of this medicine contact a poison control center or emergency room at once. NOTE: This medicine is only for you. Do not share this medicine with others. What if I miss a dose? If you miss a dose, take it as soon as you can. If it is almost time for your next dose, take only that dose. Do not take double or extra doses. What may interact with this medicine? Do not take this medicine with any of the following medications: -other gabapentin products This medicine may also interact with the following medications: -alcohol -antacids -antihistamines for allergy, cough and cold -certain medicines for anxiety or sleep -certain medicines for depression or psychotic disturbances -homatropine; hydrocodone -naproxen -narcotic medicines (opiates) for pain -phenothiazines like chlorpromazine, mesoridazine, prochlorperazine, thioridazine This list may not describe all possible interactions. Give your health care provider a list of all the medicines, herbs, non-prescription drugs, or dietary supplements you use. Also tell them if you smoke, drink alcohol, or use illegal drugs. Some items may interact with your medicine. What should I watch for while using this medicine? Visit your doctor or health care professional for regular  checks on your progress. You may want to keep a record at home of how you feel your condition is responding to treatment. You may want to share this information with your doctor or health care professional at each visit. You should contact your doctor or health care  professional if your seizures get worse or if you have any new types of seizures. Do not stop taking this medicine or any of your seizure medicines unless instructed by your doctor or health care professional. Stopping your medicine suddenly can increase your seizures or their severity. Wear a medical identification bracelet or chain if you are taking this medicine for seizures, and carry a card that lists all your medications. You may get drowsy, dizzy, or have blurred vision. Do not drive, use machinery, or do anything that needs mental alertness until you know how this medicine affects you. To reduce dizzy or fainting spells, do not sit or stand up quickly, especially if you are an older patient. Alcohol can increase drowsiness and dizziness. Avoid alcoholic drinks. Your mouth may get dry. Chewing sugarless gum or sucking hard candy, and drinking plenty of water will help. The use of this medicine may increase the chance of suicidal thoughts or actions. Pay special attention to how you are responding while on this medicine. Any worsening of mood, or thoughts of suicide or dying should be reported to your health care professional right away. Women who become pregnant while using this medicine may enroll in the [place] Pregnancy Registry by calling [phone]. This registry collects information about the safety of antiepileptic drug use during pregnancy. What side effects may I notice from receiving this medicine? Side effects that you should report to your doctor or health care professional as soon as possible: -allergic reactions like skin rash, itching or hives, swelling of the face, lips, or tongue -worsening of mood, thoughts or actions of suicide or dying Side effects that usually do not require medical attention (report to your doctor or health care professional if they continue or are bothersome): -constipation -difficulty walking or controlling muscle  movements -dizziness -nausea -slurred speech -tiredness -tremors -weight gain This list may not describe all possible side effects. Call your doctor for medical advice about side effects. You may report side effects to FDA at 1-800-FDA-1088. Where should I keep my medicine? Keep out of reach of children. This medicine may cause accidental overdose and death if it taken by other adults, children, or pets. Mix any unused medicine with a substance like cat litter or coffee grounds. Then throw the medicine away in a sealed container like a sealed bag or a coffee can with a lid. Do not use the medicine after the expiration date. Store at room temperature between 15 and 30 degrees C (59 and 86 degrees F). NOTE: This sheet is a summary. It may not cover all possible information. If you have questions about this medicine, talk to your doctor, pharmacist, or health care provider.  2018 Elsevier/Gold Standard (2013-07-21 15:26:50)

## 2016-12-17 NOTE — Progress Notes (Signed)
I have read the note, and I agree with the clinical assessment and plan.  WILLIS,CHARLES KEITH   

## 2016-12-18 NOTE — Telephone Encounter (Signed)
I spoke to pt and he stated that this is the medication (gabapentin) that he was placed on by his dermatologist.  He is off now, it did not work and is asking for something else.  Please advise.

## 2016-12-21 MED ORDER — SERTRALINE HCL 25 MG PO TABS: 25.0000 mg | ORAL_TABLET | Freq: Every day | ORAL | 5 refills | Status: DC

## 2016-12-21 NOTE — Telephone Encounter (Signed)
The patient has already tried gabapentin for chronic pruritus. I spoke to Dr. Jannifer Franklin and he recommended an antidepressant. 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 We will start on sertraline 25 mg at bedtime. I have reviewed side effects with the patient. He voices understanding. If this is not beneficial he will let us know.

## 2016-12-21 NOTE — Addendum Note (Signed)
Addended by: Trudie Buckler on: 12/21/2016 02:33 PM   Modules accepted: Orders

## 2016-12-27 ENCOUNTER — Other Ambulatory Visit: Payer: Self-pay | Admitting: Neurology

## 2016-12-29 NOTE — Telephone Encounter (Signed)
Faxed printed/signed rx xanax to pt pharmacy. Fax: [phone]. Received confirmation.

## 2016-12-30 ENCOUNTER — Telehealth: Payer: Self-pay | Admitting: Adult Health

## 2016-12-30 NOTE — Telephone Encounter (Signed)
Pt calling to inform that the ZOLOFT) Is having negative effects: shaken has worsen, vision blurry, tired and sleepy. He is asking for a call back

## 2016-12-30 NOTE — Telephone Encounter (Signed)
I have spoken with [name] this afternoon, and per MM, advised he should stop his Zoloft for 1-2 wks to see if sx. improve/resolve.  If they don't, then they are likely not due to Zoloft and he may restart it.  If they do, he should call back with that information so MM can give a different med.  He verbalized understanding of same/fim

## 2017-01-01 ENCOUNTER — Other Ambulatory Visit: Payer: Self-pay | Admitting: *Deleted

## 2017-01-01 MED ORDER — TAMSULOSIN HCL 0.4 MG PO CAPS: 0.4000 mg | ORAL_CAPSULE | Freq: Every day | ORAL | 1 refills | Status: DC

## 2017-01-04 ENCOUNTER — Other Ambulatory Visit: Payer: Self-pay | Admitting: Neurology

## 2017-01-04 MED ORDER — FLUOXETINE HCL 10 MG PO CAPS: 10.0000 mg | ORAL_CAPSULE | Freq: Every day | ORAL | 3 refills | Status: DC

## 2017-01-04 NOTE — Addendum Note (Signed)
Addended by: Kathrynn Ducking on: 01/04/2017 11:15 AM   Modules accepted: Orders

## 2017-01-04 NOTE — Telephone Encounter (Signed)
I called patient. He did not tolerate the Zoloft secondary to blurred vision and tremors, he is off the medication feeling better, we will try low-dose Prozac.  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

## 2017-01-04 NOTE — Telephone Encounter (Signed)
Pt calling to inform that has stopped taking the Zoloft and the side effect he was having has stopped.  Pt calling now for a replacement medication to be called in, please call.

## 2017-01-25 DIAGNOSIS — Z961 Presence of intraocular lens: Secondary | ICD-10-CM | POA: Diagnosis not present

## 2017-01-25 DIAGNOSIS — H5052 Exophoria: Secondary | ICD-10-CM | POA: Diagnosis not present

## 2017-01-25 DIAGNOSIS — H538 Other visual disturbances: Secondary | ICD-10-CM | POA: Diagnosis not present

## 2017-02-11 NOTE — Telephone Encounter (Signed)
Patient called office in reference to FLUoxetine (PROZAC) 10 MG capsule. Patient states it has not helped at all for the itching.  Pharmacy-  [facility].  Please call

## 2017-02-11 NOTE — Telephone Encounter (Signed)
Discussed with NP and called patient. Advised patient, per NP , that he increase Prozac to two times daily. Advised he do this for a week then call back to let this RN know whether it has been helpful. Patient repeated directions correctly, verbalized understanding, agreement, appreciation of call.

## 2017-02-23 NOTE — Telephone Encounter (Signed)
At this time I am not sure what else to offer. Perhaps discuss with dermatology and PCP again?

## 2017-02-23 NOTE — Telephone Encounter (Signed)
Spoke with patient and informed him that NP was not sure what else could be offered to help him. Advised he discuss with his PCP and dermatologist again. Patient verbalized understanding, appreciation.

## 2017-02-23 NOTE — Telephone Encounter (Signed)
Patient called office in reference to FLUoxetine (PROZAC) 10 MG capsule.  Patient called to inform that medication has not helped with burning or itching.  Would like to see what he needs to do now please.  Pharmacy- [facility].  Please call

## 2017-03-10 DIAGNOSIS — H40023 Open angle with borderline findings, high risk, bilateral: Secondary | ICD-10-CM | POA: Diagnosis not present

## 2017-03-10 DIAGNOSIS — Z961 Presence of intraocular lens: Secondary | ICD-10-CM | POA: Diagnosis not present

## 2017-03-10 DIAGNOSIS — H3561 Retinal hemorrhage, right eye: Secondary | ICD-10-CM | POA: Diagnosis not present

## 2017-03-10 DIAGNOSIS — H532 Diplopia: Secondary | ICD-10-CM | POA: Diagnosis not present

## 2017-03-22 DIAGNOSIS — H538 Other visual disturbances: Secondary | ICD-10-CM | POA: Diagnosis not present

## 2017-03-22 DIAGNOSIS — H532 Diplopia: Secondary | ICD-10-CM | POA: Diagnosis not present

## 2017-03-22 DIAGNOSIS — H5052 Exophoria: Secondary | ICD-10-CM | POA: Diagnosis not present

## 2017-04-07 NOTE — Progress Notes (Addendum)
[name] Subjective:  HPI: [name] is a 76 y.o. male who presents for follow up of [facility].  Please see Assessment and Plan below for the status of his chronic medical problems.  Review of Systems: Review of Systems  Constitutional: Negative for malaise/fatigue.  Respiratory: Negative for cough and shortness of breath.   Musculoskeletal: Negative for falls.  Skin: Positive for itching.  Psychiatric/Behavioral: Negative for depression and memory loss. The patient does not have insomnia.     Objective:  Physical Exam: Vitals:   04/08/17 0947  BP: (!) 110/59  Pulse: 65  Temp: 98.2 F (36.8 C)  TempSrc: Oral  SpO2: 98%  Weight: 129 lb 1.6 oz (58.6 kg)  Physical Exam  Constitutional: He is oriented to person, place, and time and well-developed, well-nourished, and in no distress.  Cardiovascular: Normal rate and regular rhythm.  Pulmonary/Chest: Effort normal and breath sounds normal. He has no wheezes.  Abdominal: Soft. Bowel sounds are normal.  Musculoskeletal: He exhibits no edema.  Neurological: He is alert and oriented to person, place, and time.  Psychiatric: Affect normal.  Nursing note and vitals reviewed.  Assessment & Plan:  Itching HPI: Had some chronic pruritus for the last 2 years or so.  He saw his dermatologist Dr. Nevada Crane and then subsequently saw Dr. Pablo Ledger.  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  He has been seeing Dr. Jannifer Franklin his nurse practitioner Irish Elders.  He has tried antihistamines with Zyrtec limited effect.  Started on 25 mg of sertraline however this caused blurry vision was discontinued.  He was subsequently started on 10 mg of Prozac but this was ineffective.  Additionally it appears that he was prescribed 100 mg of gabapentin 3 times daily however he reports he is unfamiliar with this medication does not think he restarted it.  Overall these agents have been  ineffective for him, he has not found any creams prescribed by dermatology that has been particularly effective. That said he reports that he overall is less bothered by the itching is mostly limited to his extremities.  In discussing this with him today he reports that given all the side effects from multiple medications he thinks he would rather not start any new medications for his chronic pruritus.  Assessment chronic pruritus  Plan Discussed with the standard medications that typically we treat chronic pruritus with he has tried most of these medications and has seen both neurology and dermatology.  Today he reports he is not too bothered by it I do not know that I have much additional to offer him.  Mild cognitive impairment with memory loss HPI: He has been following with Dr. Tobey Grim office for his mild cognitive impairment.  He is currently on Aricept and Namenda.  He has not noticed any further decline in his memory ADLs or IADLs.  A: Mild cognitive impairment  Plan he has follow-up with Dr. Jannifer Franklin in 2 months.  Erectile dysfunction HPI: he  Is requesting a new Rx for viagra. Never filled his last one due to concern of pripism from comercials.    A: ED  P: Rx Slidenafil 50mg  as needed prior to sexual activity May self titrate up to 100mg . Discussed priapism and other common AE.   Medications Ordered Meds ordered this encounter  Medications  . Zoster Vaccine Adjuvanted [facility]) injection    Sig: Inject 0.5 mLs into the muscle once.    Dispense:  0.5 mL  Refill:  0  . sildenafil (VIAGRA) 100 MG tablet    Sig: Take 1 tablet (100 mg total) by mouth as needed for erectile dysfunction.    Dispense:  10 tablet    Refill:  3   Other Orders Orders Placed This Encounter  Procedures  . Flu Vaccine QUAD 36+ mos IM   Follow Up: Return in about 6 months (around 10/06/2017).

## 2017-04-08 ENCOUNTER — Encounter: Payer: Self-pay | Admitting: Internal Medicine

## 2017-04-08 ENCOUNTER — Ambulatory Visit (INDEPENDENT_AMBULATORY_CARE_PROVIDER_SITE_OTHER): Payer: Medicare Other | Admitting: Internal Medicine

## 2017-04-08 VITALS — BP 110/59 | HR 65 | Temp 98.2°F | Wt 129.1 lb

## 2017-04-08 DIAGNOSIS — N529 Male erectile dysfunction, unspecified: Secondary | ICD-10-CM

## 2017-04-08 DIAGNOSIS — Z87891 Personal history of nicotine dependence: Secondary | ICD-10-CM

## 2017-04-08 DIAGNOSIS — G3184 Mild cognitive impairment, so stated: Secondary | ICD-10-CM | POA: Diagnosis not present

## 2017-04-08 DIAGNOSIS — Z79899 Other long term (current) drug therapy: Secondary | ICD-10-CM

## 2017-04-08 DIAGNOSIS — L298 Other pruritus: Secondary | ICD-10-CM

## 2017-04-08 DIAGNOSIS — Z23 Encounter for immunization: Secondary | ICD-10-CM | POA: Diagnosis not present

## 2017-04-08 DIAGNOSIS — L299 Pruritus, unspecified: Secondary | ICD-10-CM

## 2017-04-08 MED ORDER — SILDENAFIL CITRATE 100 MG PO TABS: 100.0000 mg | ORAL_TABLET | ORAL | 3 refills | Status: DC | PRN

## 2017-04-08 MED ORDER — ZOSTER VAC RECOMB ADJUVANTED 50 MCG/0.5ML IM SUSR: 0.5000 mL | Freq: Once | INTRAMUSCULAR | 0 refills | Status: AC

## 2017-04-12 NOTE — Assessment & Plan Note (Signed)
HPI: He has been following with Dr. Tobey Grim office for his mild cognitive impairment.  He is currently on Aricept and Namenda.  He has not noticed any further decline in his memory ADLs or IADLs.  A: Mild cognitive impairment  Plan he has follow-up with Dr. Jannifer Franklin in 2 months.

## 2017-04-12 NOTE — Assessment & Plan Note (Signed)
HPI: he  Is requesting a new Rx for viagra. Never filled his last one due to concern of pripism from comercials.    A: ED  P: Rx Slidenafil 50mg  as needed prior to sexual activity May self titrate up to 100mg . Discussed priapism and other common AE.

## 2017-04-12 NOTE — Assessment & Plan Note (Signed)
HPI: Had some chronic pruritus for the last 2 years or so.  He saw his dermatologist Dr. Nevada Crane and then subsequently saw Dr. Pablo Ledger.  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  He has been seeing Dr. Jannifer Franklin his nurse practitioner Irish Elders.  He has tried antihistamines with Zyrtec limited effect.  Started on 25 mg of sertraline however this caused blurry vision was discontinued.  He was subsequently started on 10 mg of Prozac but this was ineffective.  Additionally it appears that he was prescribed 100 mg of gabapentin 3 times daily however he reports he is unfamiliar with this medication does not think he restarted it.  Overall these agents have been ineffective for him, he has not found any creams prescribed by dermatology that has been particularly effective. That said he reports that he overall is less bothered by the itching is mostly limited to his extremities.  In discussing this with him today he reports that given all the side effects from multiple medications he thinks he would rather not start any new medications for his chronic pruritus.  Assessment chronic pruritus  Plan Discussed with the standard medications that typically we treat chronic pruritus with he has tried most of these medications and has seen both neurology and dermatology.  Today he reports he is not too bothered by it I do not know that I have much additional to offer him.

## 2017-04-21 DIAGNOSIS — H3561 Retinal hemorrhage, right eye: Secondary | ICD-10-CM | POA: Diagnosis not present

## 2017-05-27 ENCOUNTER — Other Ambulatory Visit: Payer: Self-pay

## 2017-05-27 ENCOUNTER — Other Ambulatory Visit: Payer: Self-pay | Admitting: *Deleted

## 2017-05-27 DIAGNOSIS — K222 Esophageal obstruction: Principal | ICD-10-CM

## 2017-05-27 DIAGNOSIS — K219 Gastro-esophageal reflux disease without esophagitis: Secondary | ICD-10-CM

## 2017-05-27 MED ORDER — ATORVASTATIN CALCIUM 40 MG PO TABS: 40.0000 mg | ORAL_TABLET | Freq: Every day | ORAL | 3 refills | Status: DC

## 2017-05-27 MED ORDER — PANTOPRAZOLE SODIUM 40 MG PO TBEC: 40.0000 mg | DELAYED_RELEASE_TABLET | Freq: Every day | ORAL | 1 refills | Status: DC

## 2017-05-27 NOTE — Telephone Encounter (Signed)
atorvastatin (LIPITOR) 40 MG tablet, refill request @ [facility] on [address].

## 2017-05-28 ENCOUNTER — Other Ambulatory Visit: Payer: Self-pay | Admitting: *Deleted

## 2017-06-22 ENCOUNTER — Ambulatory Visit (INDEPENDENT_AMBULATORY_CARE_PROVIDER_SITE_OTHER): Payer: Medicare Other | Admitting: Adult Health

## 2017-06-22 ENCOUNTER — Encounter: Payer: Self-pay | Admitting: Adult Health

## 2017-06-22 VITALS — BP 125/70 | HR 69 | Wt 130.4 lb

## 2017-06-22 DIAGNOSIS — R413 Other amnesia: Secondary | ICD-10-CM | POA: Diagnosis not present

## 2017-06-22 DIAGNOSIS — R259 Unspecified abnormal involuntary movements: Secondary | ICD-10-CM | POA: Diagnosis not present

## 2017-06-22 DIAGNOSIS — G252 Other specified forms of tremor: Secondary | ICD-10-CM

## 2017-06-22 MED ORDER — ALPRAZOLAM 0.5 MG PO TABS: ORAL_TABLET | ORAL | 0 refills | Status: DC

## 2017-06-22 NOTE — Progress Notes (Signed)
PATIENT: [name] DOB: 02/06/1941  REASON FOR VISIT: follow up HISTORY FROM: patient  HISTORY OF PRESENT ILLNESS: Today 06/22/17 [name] is a 77 year old male with a history of mild memory disturbance.  He returns today for follow-up.  He continues on Aricept and Namenda.  He feels that his memory may be slightly worse.  He lives at home alone.  He is able to complete all ADLs independently.  He does operate a motor vehicle without difficulty.  Denies any trouble sleeping.  Denies hallucinations.  Denies any changes in his mood.  He does report that when he is ambulating he does feel as if he fatigues more quickly.  For example if he walks down the block by the time he comes back he feels like his legs are heavy.  He does have a resting tremor that primarily started in the right hand but is now in the left hand.  He denies any falls.  Denies any trouble chewing or swallowing.  He returns today for an evaluation.  HISTORY 12/17/16: [name] is a 77 year old male with a history of mild memory disturbance. He returns today for follow-up. He is currently on Aricept and Namenda and tolerating those well. He continues to live at home alone. He is able to complete all ADLs and apparently. He doesn't operate a motor vehicle states that he typically does not drive long distances. He handles his own finances and prepares all meals without difficulty. He states that he has been having chronic pruritus. This occurs in various locations throughout the day. He is seen to dermatologist and was prescribed topical and oral medication with no benefit. He states his dermatologist recommended that he see a neurologist. He returns today for follow-up.       REVIEW OF SYSTEMS: Out of a complete 14 system review of symptoms, the patient complains only of the following symptoms, and all other reviewed systems are negative.  ALLERGIES: Allergies  Allergen Reactions  . Zoloft [Sertraline Hcl]     Tremors, blurred  vision  . Ambien [Zolpidem Tartrate] Other (See Comments)    paranoia    HOME MEDICATIONS: Outpatient Medications Prior to Visit  Medication Sig Dispense Refill  . ALPRAZolam (XANAX) 0.5 MG tablet TAKE ONE TABLET BY MOUTH AT BEDTIME AS NEEDED FOR ANXIETY 90 tablet 1  . aspirin 81 MG tablet Take 81 mg by mouth every evening.     [name] atorvastatin (LIPITOR) 40 MG tablet Take 1 tablet (40 mg total) by mouth daily. 90 tablet 3  . cetirizine (ZYRTEC) 10 MG tablet Take 10 mg by mouth 2 (two) times daily.    [name] donepezil (ARICEPT) 10 MG tablet TAKE ONE TABLET BY MOUTH AT BEDTIME 90 tablet 3  . memantine (NAMENDA) 10 MG tablet Take 1 tablet (10 mg total) by mouth 2 (two) times daily. 180 tablet 3  . Multiple Vitamin (MULTIVITAMIN) tablet Take 1 tablet by mouth daily.    . naproxen sodium (ANAPROX) 220 MG tablet Take 220 mg by mouth as needed.    . Omega 3 1000 MG CAPS Take 1 capsule by mouth daily.    . pantoprazole (PROTONIX) 40 MG tablet Take 1 tablet (40 mg total) by mouth daily. 90 tablet 1  . Polyethyl Glycol-Propyl Glycol (SYSTANE) 0.4-0.3 % SOLN Place 1 drop into both eyes 2 (two) times daily.    . tamsulosin (FLOMAX) 0.4 MG CAPS capsule Take 1 capsule (0.4 mg total) by mouth daily. 90 capsule 1  .  albuterol (PROVENTIL HFA;VENTOLIN HFA) 108 (90 Base) MCG/ACT inhaler Inhale 1-2 puffs into the lungs every 6 (six) hours as needed for wheezing or shortness of breath.    . sildenafil (VIAGRA) 100 MG tablet Take 1 tablet (100 mg total) by mouth as needed for erectile dysfunction. (Patient not taking: Reported on 06/22/2017) 10 tablet 3  . triamcinolone cream (KENALOG) 0.1 %     . triamcinolone cream (KENALOG) 0.5 % Apply 1 application topically 3 (three) times daily.     No facility-administered medications prior to visit.     PAST MEDICAL HISTORY: Past Medical History:  Diagnosis Date  . 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 Colonic polyp    Tubulovillous adenomatous  . COPD (chronic obstructive pulmonary  disease) ([place])   . Delirium, acute 2015   After anesthesia for colectomy - resolved in 48hrs  . GERD (gastroesophageal reflux disease)   . Gout 12/2007   inadequate aspiration of MTP joint left great toe  06/18/08; -fluid:  red, bloody, cell count not performed (TNP) and no crystals or organisms seen.  . Hyperlipidemia   . Insomnia   . PERFORATION, INTESTINE 06/19/2006   Annotation: During colonoscopy Qualifier: History of  By: Oretha Ellis    . Tobacco abuse    quit 2001    PAST SURGICAL HISTORY: Past Surgical History:  Procedure Laterality Date  . CATARACT EXTRACTION Bilateral   . COLONOSCOPY    . DUPUYTREN CONTRACTURE RELEASE Right   . LAPAROSCOPIC PARTIAL COLECTOMY Right 01/18/2014   Procedure: LAPAROSCOPIC PARTIAL RIGHT COLECTOMY;  Surgeon: Adin Hector, MD;  Location: [facility];  Service: General;  Laterality: Right;  . NECK SURGERY  1970   removal cyst outside neck  . WRIST SURGERY Left     FAMILY HISTORY: Family History  Problem Relation Age of Onset  . Cerebral aneurysm Mother   . Cancer Father        lung cancer/asbestos  . Colon cancer Neg Hx   . Pancreatic cancer Neg Hx   . Stomach cancer Neg Hx   . Rectal cancer Neg Hx     SOCIAL HISTORY: Social History   Socioeconomic History  . Marital status: Widowed    Spouse name: Not on file  . Number of children: 1  . Years of education: 3  . Highest education level: Not on file  Social Needs  . Financial resource strain: Not on file  . Food insecurity - worry: Not on file  . Food insecurity - inability: Not on file  . Transportation needs - medical: Not on file  . Transportation needs - non-medical: Not on file  Occupational History  . Occupation: Retired    Fish farm manager: RETIRED  Tobacco Use  . Smoking status: Former Smoker    Types: Cigarettes    Last attempt to quit: 08/27/2000    Years since quitting: 16.8  . Smokeless tobacco: Never Used  Substance and Sexual Activity  . Alcohol use: No     Alcohol/week: 0.0 oz    Comment: none x 7 years  . Drug use: No  . Sexual activity: Not on file  Other Topics Concern  . Not on file  Social History Narrative   Lives at home alone   Patient is right handed.   Patient drinks about 2-3 glasses of tea daily      PHYSICAL EXAM  Vitals:   06/22/17 1426  BP: 125/70  Pulse: 69  Weight: 130 lb 6.4 oz (59.1 kg)   Body mass index  is 21.05 kg/m.  MMSE - Mini Mental State Exam 06/22/2017 12/17/2016 06/17/2016  Orientation to time 5 5 4   Orientation to Place 5 5 5   Registration 3 3 3   Attention/ Calculation 5 5 3   Recall 3 3 3   Language- name 2 objects 2 2 2   Language- repeat 1 1 1   Language- follow 3 step command 3 3 3   Language- read & follow direction 1 1 1   Write a sentence 1 1 1   Copy design 0 0 0  Total score 29 29 26      Generalized: Well developed, in no acute distress   Neurological examination  Mentation: Alert oriented to time, place, history taking. Follows all commands speech and language fluent Cranial nerve II-XII: Pupils were equal round reactive to light. Extraocular movements were full, visual field were full on confrontational test. Facial sensation and strength were normal. Uvula tongue midline. Head turning and shoulder shrug  were normal and symmetric.  Mild masking of the face Motor: The motor testing reveals 5 over 5 strength of all 4 extremities. Good symmetric motor tone is noted throughout.  Finger taps and toe taps normal Sensory: Sensory testing is intact to soft touch on all 4 extremities. No evidence of extinction is noted.  Coordination: Cerebellar testing reveals good finger-nose-finger and heel-to-shin bilaterally.  Gait and station: Patient is able to stand from a sitting position.  Decreased arm swing primarily on the right.  Good turns.  Slightly stooped posture.  Good stride. Reflexes: Deep tendon reflexes are symmetric and normal bilaterally.   DIAGNOSTIC DATA (LABS, IMAGING, TESTING) - I  reviewed patient records, labs, notes, testing and imaging myself where available.  Lab Results  Component Value Date   WBC 7.6 09/10/2016   HGB 15.7 09/10/2016   HCT 44.9 09/10/2016   MCV 90 09/10/2016   PLT 154 09/10/2016      Component Value Date/Time   NA 144 09/10/2016 1131   K 4.6 09/10/2016 1131   CL 103 09/10/2016 1131   CO2 27 09/10/2016 1131   GLUCOSE 94 09/10/2016 1131   GLUCOSE 81 04/12/2014 1454   BUN 17 09/10/2016 1131   CREATININE 1.24 09/10/2016 1131   CREATININE 1.13 04/12/2014 1454   CALCIUM 9.5 09/10/2016 1131   PROT 6.1 09/10/2016 1131   ALBUMIN 4.1 09/10/2016 1131   AST 16 09/10/2016 1131   ALT 35 09/10/2016 1131   ALKPHOS 78 09/10/2016 1131   BILITOT 0.7 09/10/2016 1131   GFRNONAA 57 (L) 09/10/2016 1131   GFRNONAA 65 04/12/2014 1454   GFRAA 65 09/10/2016 1131   GFRAA 75 04/12/2014 1454   Lab Results  Component Value Date   CHOL 124 09/10/2016   HDL 50 09/10/2016   LDLCALC 56 09/10/2016   TRIG 89 09/10/2016   CHOLHDL 2.5 09/10/2016   Lab Results  Component Value Date   HGBA1C 5.8 (H) 01/11/2014   Lab Results  Component Value Date   VITAMINB12 550 04/12/2014   Lab Results  Component Value Date   TSH 1.094 11/01/2014      ASSESSMENT AND PLAN 77 y.o. 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 here with:  1.  Memory disturbance 2.  Resting tremor  The patient's memory score has remained stable.  He will continue you on Aricept and Namenda.  We will continue to  monitor for symptoms of Parkinson's disease.  As of now his symptoms seem to be relatively stable with little progression.  He is advised that if his symptoms worsen or he develops new symptoms he should let us know.  He will follow-up in 6  months or sooner if needed.      Ward Givens, MSN, NP-C 06/22/2017, 2:40 PM [facility] [address], [place], [place] [address] [phone]

## 2017-06-22 NOTE — Patient Instructions (Signed)
Your Plan:  Continue Aricept and Namenda Memory score is stable We will continue to monitor for parkinson's If your symptoms worsen or you develop new symptoms please let us know.   Thank you for coming to see [place] at [facility]. I hope we have been able to provide you high quality care today.  You may receive a patient satisfaction survey over the next few weeks. We would appreciate your feedback and comments so that we may continue to improve ourselves and the health of our patients.

## 2017-06-22 NOTE — Progress Notes (Signed)
I have read the note, and I agree with the clinical assessment and plan.  [name]   

## 2017-06-22 NOTE — Progress Notes (Signed)
Alprazolam Rx successfully faxed to [facility], [address].

## 2017-07-02 DIAGNOSIS — L298 Other pruritus: Secondary | ICD-10-CM | POA: Diagnosis not present

## 2017-07-02 DIAGNOSIS — D1801 Hemangioma of skin and subcutaneous tissue: Secondary | ICD-10-CM | POA: Diagnosis not present

## 2017-07-02 DIAGNOSIS — L570 Actinic keratosis: Secondary | ICD-10-CM | POA: Diagnosis not present

## 2017-07-02 DIAGNOSIS — L821 Other seborrheic keratosis: Secondary | ICD-10-CM | POA: Diagnosis not present

## 2017-07-02 DIAGNOSIS — D044 Carcinoma in situ of skin of scalp and neck: Secondary | ICD-10-CM | POA: Diagnosis not present

## 2017-07-02 DIAGNOSIS — D225 Melanocytic nevi of trunk: Secondary | ICD-10-CM | POA: Diagnosis not present

## 2017-07-02 DIAGNOSIS — D485 Neoplasm of uncertain behavior of skin: Secondary | ICD-10-CM | POA: Diagnosis not present

## 2017-07-07 ENCOUNTER — Other Ambulatory Visit: Payer: Self-pay | Admitting: Adult Health

## 2017-07-08 ENCOUNTER — Other Ambulatory Visit: Payer: Self-pay | Admitting: *Deleted

## 2017-07-09 MED ORDER — TAMSULOSIN HCL 0.4 MG PO CAPS: 0.4000 mg | ORAL_CAPSULE | Freq: Every day | ORAL | 1 refills | Status: DC

## 2017-07-14 DIAGNOSIS — L570 Actinic keratosis: Secondary | ICD-10-CM | POA: Diagnosis not present

## 2017-07-14 DIAGNOSIS — D044 Carcinoma in situ of skin of scalp and neck: Secondary | ICD-10-CM | POA: Diagnosis not present

## 2017-07-26 DIAGNOSIS — J449 Chronic obstructive pulmonary disease, unspecified: Secondary | ICD-10-CM | POA: Diagnosis not present

## 2017-07-26 DIAGNOSIS — L299 Pruritus, unspecified: Secondary | ICD-10-CM | POA: Diagnosis not present

## 2017-07-26 DIAGNOSIS — J309 Allergic rhinitis, unspecified: Secondary | ICD-10-CM | POA: Diagnosis not present

## 2017-07-26 DIAGNOSIS — R21 Rash and other nonspecific skin eruption: Secondary | ICD-10-CM | POA: Diagnosis not present

## 2017-08-23 IMAGING — XA DG MYELOGRAPHY LUMBAR INJ MULTI REGION
12 of 22 series · 12 of 22 positions shown · non-contrast
Comparison: Lumbar spine MRI from [REDACTED]
dated 12/14/2015. Chest CT 11/28/2009.

CLINICAL DATA: Right-sided low back pain with radicular pain
extending down the posterior right lower extremity to the knee.
Milder pain in the left lower extremity. Thoracic spine pain.
TECHNIQUE: Contiguous axial images were obtained through the Thoracic and
Lumbar spine after the intrathecal infusion of infusion. Coronal and
sagittal reconstructions were obtained of the axial image sets.

[Series 1: w lumbar spine lat · 0.15mm/px · 1 of 1 slices shown]
[im 1/1]
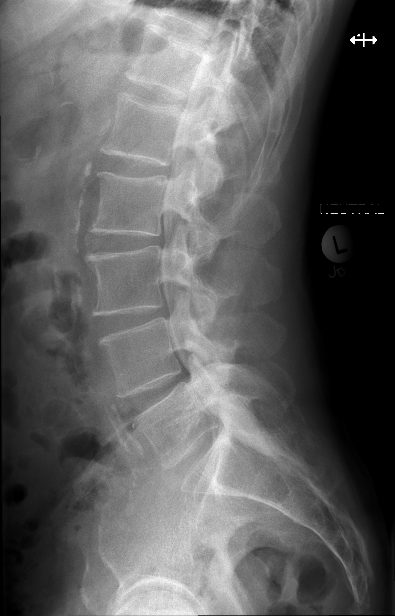

[Series 2: vasc standard · 1 of 1 slices shown (1 of 11)]
[im 1/1]
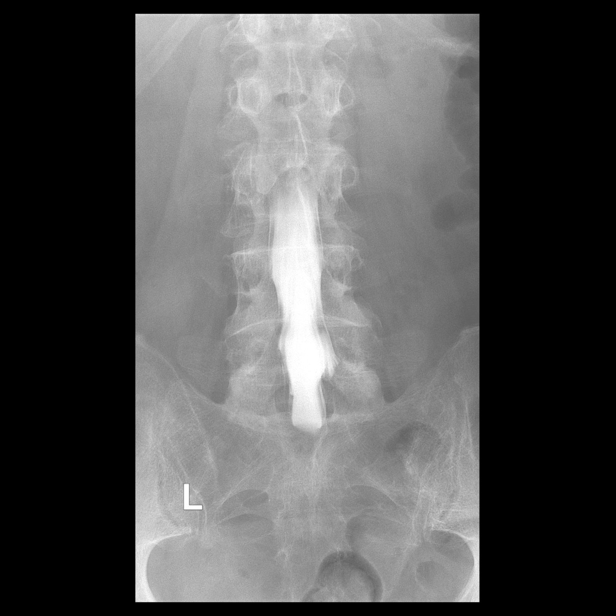

[Series 3: vasc standard · 1 of 1 slices shown (2 of 11)]
[im 1/1]
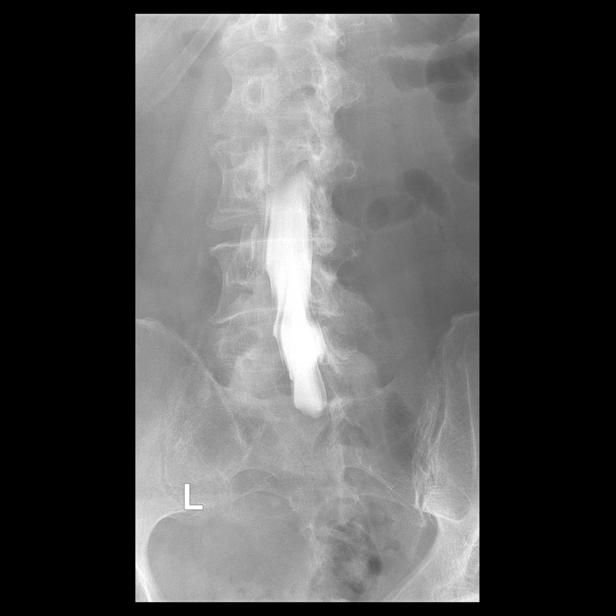

[Series 4: vasc standard · 1 of 1 slices shown (3 of 11)]
[im 1/1]
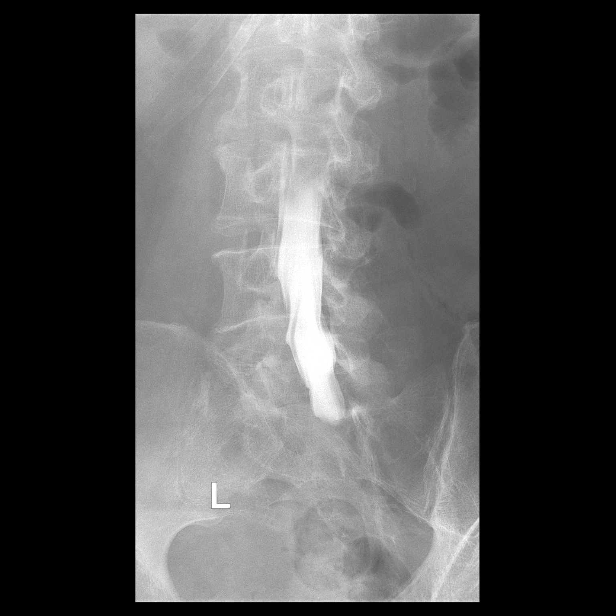

[Series 6: vasc standard · 1 of 1 slices shown (4 of 11)]
[im 1/1]
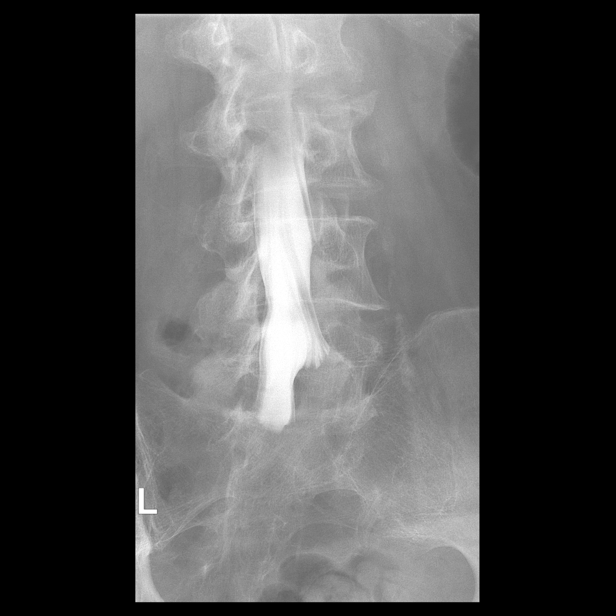

[Series 8: vasc standard · 1 of 1 slices shown (5 of 11)]
[im 1/1]
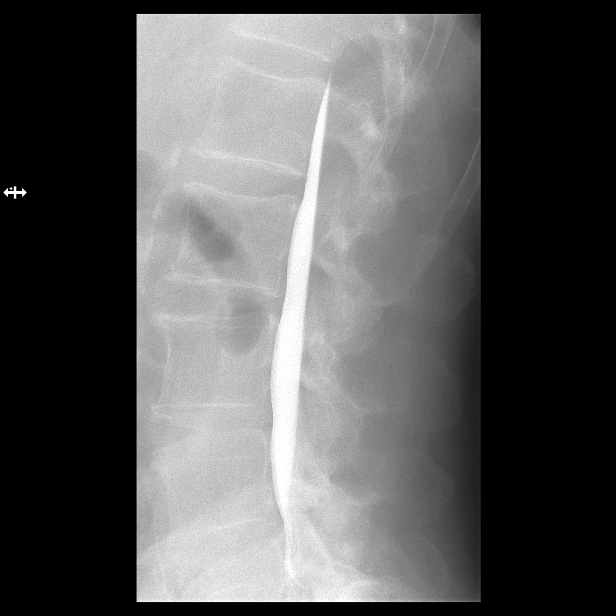

[Series 9: vasc standard · 1 of 1 slices shown (6 of 11)]
[im 1/1]
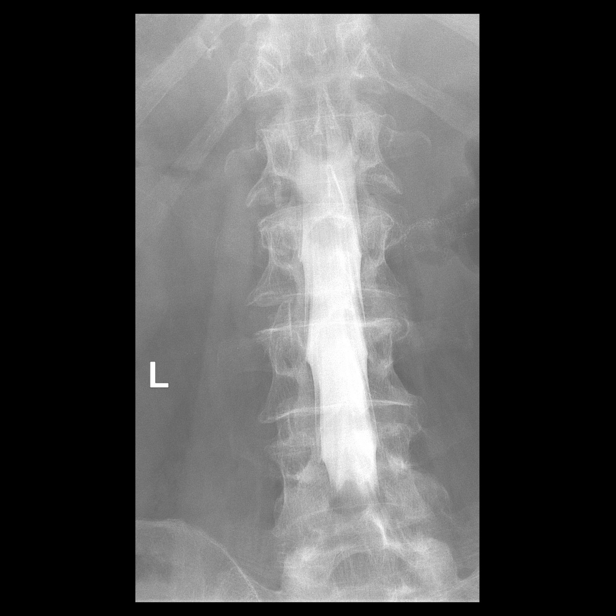

[Series 11: vasc standard · 1 of 1 slices shown (7 of 11)]
[im 1/1]
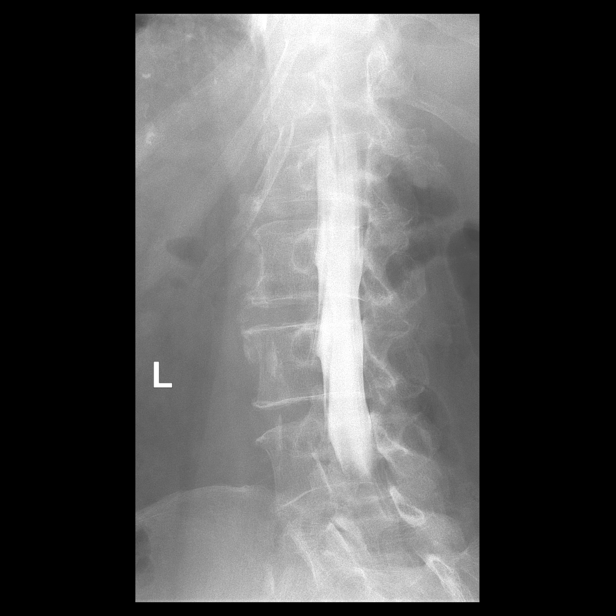

[Series 13: vasc standard · 1 of 1 slices shown (8 of 11)]
[im 1/1]
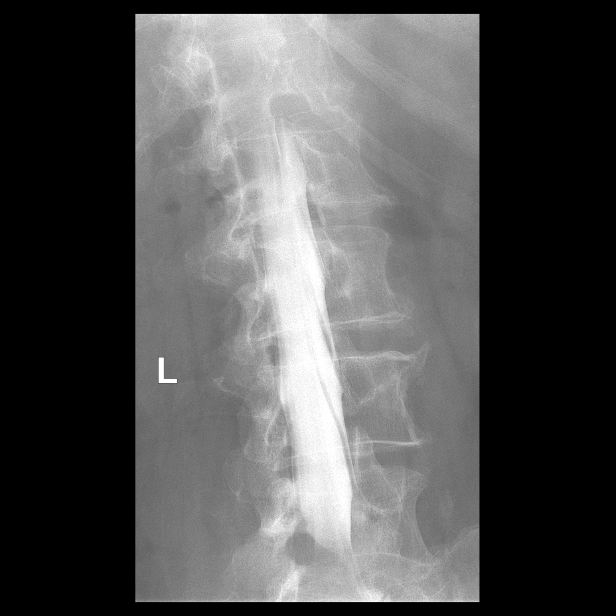

[Series 15: vasc standard · 1 of 1 slices shown (9 of 11)]
[im 1/1]
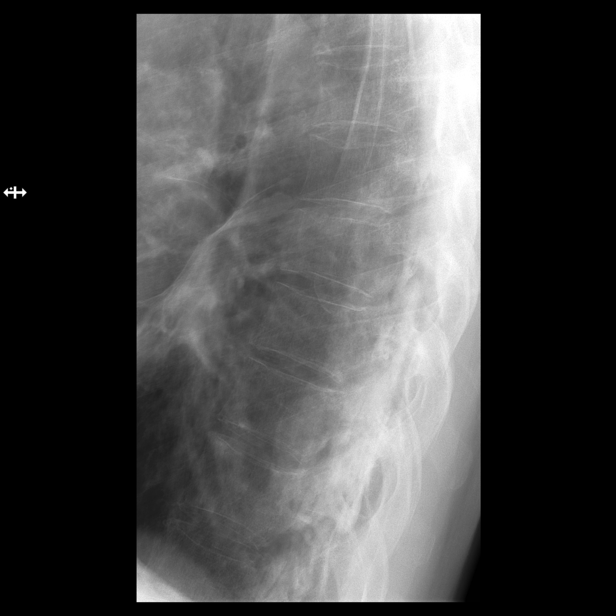

[Series 17: vasc standard · 1 of 1 slices shown (10 of 11)]
[im 1/1]
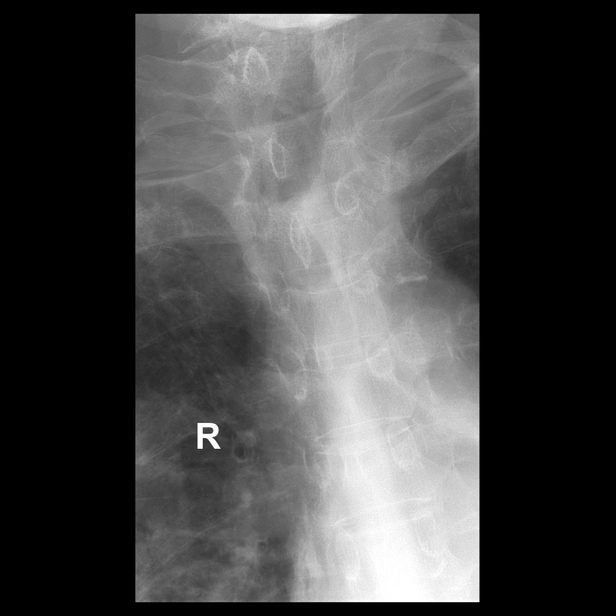

[Series 19: vasc standard · 1 of 1 slices shown (11 of 11)]
[im 1/1]
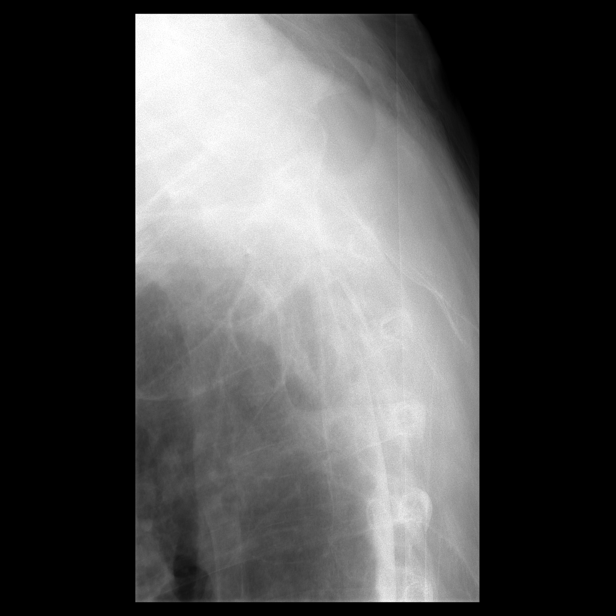

[12 of 22 positions shown; findings below may reference images not displayed]

FLUOROSCOPY TIME:  Radiation Exposure Index (as provided by the
fluoroscopic device): 305.88 microGray*m^2

Fluoroscopy Time (in minutes and seconds):  1 minute

PROCEDURE:
LUMBAR PUNCTURE FOR THORACIC AND LUMBAR MYELOGRAM

After thorough discussion of risks and benefits of the procedure
including bleeding, infection, injury to nerves, blood vessels,
adjacent structures as well as headache and CSF leak, written and
oral informed consent was obtained. Consent was obtained by Dr.
Nomasibulele Moatshe.

Patient was positioned prone on the fluoroscopy table. Local
anesthesia was provided with 1% lidocaine without epinephrine after
prepped and draped in the usual sterile fashion. Puncture was
performed at L3-4 using a 3 1/2 inch 22-gauge spinal needle via a
left interlaminar approach. Using a single pass through the dura,
the needle was placed within the thecal sac, with return of clear
CSF. 10 mL of Isovue M 300 was injected into the thecal sac, with
normal opacification of the nerve roots and cauda equina consistent
with free flow within the subarachnoid space. The patient was then
moved to the trendelenburg position and contrast flowed into the
Thoracic spine region.

I personally performed the lumbar puncture and administered the
intrathecal contrast. I also personally supervised acquisition of
the myelogram images.
FINDINGS: THORACIC AND LUMBAR MYELOGRAM FINDINGS:

Thoracic and vertebral alignment are normal. There is no evidence of
abnormal motion in the lumbar spine on upright flexion or extension
images. There is no evidence of thoracic spinal stenosis, although
the upper thoracic spine is suboptimally evaluated due to
superimposed structures and will be better assessed on the following
CT. The right L5 and S1 nerve roots appear partially conjoined.
There is evidence of mild left lateral recess narrowing at L4-5 with
slight asymmetric underfilling of the left L5 nerve root sleeve.
There is no evidence of significant spinal stenosis in the lumbar
spine.

CT THORACIC MYELOGRAM FINDINGS:

Thoracic vertebral alignment is normal. Several mild superior
endplate compression deformities/Schmorl's nodes in the mid to upper
upper thoracic spine are unchanged, most notably at T3. The spinal
cord is normal in caliber.

Extensive calcified pleural plaques are present bilaterally and
consistent with priors asbestos exposure. There is pleural
parenchymal scarring in the lung apices. Thoracic aortic
atherosclerosis is noted.

Moderate to severe disc space narrowing is present at C5-6 and C6-7
with broad-based posterior disc osteophyte complex and uncovertebral
spurring resulting in mild-to-moderate right and moderate to severe
left neural foraminal stenosis at C5-6 and mild left neural
foraminal stenosis at C6-7. There is no associated spinal stenosis
or spinal cord mass effect.

No disc herniation is identified in the thoracic spine. The thoracic
spinal canal is widely patent. Facet spurring results in mild left
neural foraminal narrowing at T2-3. Facet spurring/ligamentous
calcification on the right at T9-10 results in slight narrowing of
the entrance to the neural foramen.

CT LUMBAR MYELOGRAM FINDINGS:

Lumbar vertebral alignment is normal. Vertebral body heights are
preserved without evidence of fracture. Intervertebral disc space
heights are preserved. The conus medullaris terminates at the
inferior aspect of L1. Moderate aortoiliac atherosclerosis is noted.

L1-2: Minimal disc bulging without stenosis, unchanged.

L2-3: Mild disc bulging asymmetric to the left without stenosis,
unchanged.

L3-4: Mild disc bulging and mild ligamentum flavum thickening
without stenosis, unchanged.

L4-5: Mild circumferential disc bulging and mild facet and
ligamentum flavum hypertrophy result in mild left lateral recess and
minimal bilateral neural foraminal narrowing without spinal
stenosis, unchanged.

L5-S1: Partially conjoined L5-S1 nerve root sleeves on the right.
Minimal disc bulging and small left paracentral disc osteophyte
complex which approach but do not appear to impinge on the S1 nerve
roots. No spinal or neural foraminal stenosis.
IMPRESSION: 1. No disc herniation or significant stenosis in the thoracic spine.
2. Mild lumbar spondylosis with mild left lateral recess narrowing
at L4-5 due to disc bulging and posterior element hypertrophy.
3. Small disc osteophyte complex at L5-S1 which approaches but does
not impinge on the S1 nerve roots.

## 2017-09-08 NOTE — Progress Notes (Signed)
  Subjective:  HPI: [name] is a 77 y.o. male who presents for paperwork/ f/u low back pain.  Please see Assessment and Plan below for the status of his chronic medical problems.  Review of Systems: Review of Systems  Constitutional: Negative for fever and malaise/fatigue.  Eyes: Negative for blurred vision.  Respiratory: Negative for cough and shortness of breath.   Genitourinary: Negative for dysuria.  Musculoskeletal: Positive for back pain. Negative for falls.  Skin: Positive for itching.  Neurological: Positive for tingling. Negative for dizziness, sensory change, focal weakness and weakness.  Psychiatric/Behavioral: Negative for depression.    Objective:  Physical Exam: Vitals:   09/09/17 1057  BP: (!) 114/56  Pulse: 68  Temp: (!) 97.3 F (36.3 C)  TempSrc: Oral  SpO2: 98%  Weight: 128 lb (58.1 kg)  Height: 5\' 6"  (1.676 m)   Physical Exam  Constitutional: He is oriented to person, place, and time and well-developed, well-nourished, and in no distress.  Cardiovascular: Normal rate and regular rhythm.  Pulmonary/Chest: Effort normal and breath sounds normal.  Abdominal: Bowel sounds are normal.  Musculoskeletal: Normal range of motion.  Neurological: He is alert and oriented to person, place, and time.  Skin: Skin is warm and dry.  Psychiatric: Affect and judgment normal.  Nursing note and vitals reviewed.  Assessment & Plan:  Chronic low back pain with right-sided sciatica HPI: He has continued to have low back pain, notes that when walking upright for >1 block he will have some numbness and tingling from buttock down mostly in b/l thighs, has to stop and rest and improves.  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  He has previously seen [name] who obtained MRI but was overall concerned about hyperreflexia and sent back to neurology with dr [name] who was less concerned about  this.  It appears he may never have followed up with Orthopedics, I have encouraged him to do so to discuss options.  Itching Puritis still present, shifts location, mostly using moisturizing cream.  Dermatology and Neurology were unable to find a specific cause, he notes doing OK with her current regimen.    Medications Ordered No orders of the defined types were placed in this encounter.  Other Orders No orders of the defined types were placed in this encounter.  Follow Up: Return 3-6 months.

## 2017-09-09 ENCOUNTER — Encounter: Payer: Self-pay | Admitting: Internal Medicine

## 2017-09-09 ENCOUNTER — Other Ambulatory Visit: Payer: Self-pay

## 2017-09-09 ENCOUNTER — Ambulatory Visit (INDEPENDENT_AMBULATORY_CARE_PROVIDER_SITE_OTHER): Payer: Medicare Other | Admitting: Internal Medicine

## 2017-09-09 VITALS — BP 114/56 | HR 68 | Temp 97.3°F | Ht 66.0 in | Wt 128.0 lb

## 2017-09-09 DIAGNOSIS — M5441 Lumbago with sciatica, right side: Secondary | ICD-10-CM | POA: Diagnosis present

## 2017-09-09 DIAGNOSIS — G9519 Other vascular myelopathies: Secondary | ICD-10-CM | POA: Diagnosis not present

## 2017-09-09 DIAGNOSIS — G8929 Other chronic pain: Secondary | ICD-10-CM

## 2017-09-09 DIAGNOSIS — L299 Pruritus, unspecified: Secondary | ICD-10-CM

## 2017-09-09 DIAGNOSIS — Z87891 Personal history of nicotine dependence: Secondary | ICD-10-CM

## 2017-09-09 NOTE — Assessment & Plan Note (Signed)
Puritis still present, shifts location, mostly using moisturizing cream.  Dermatology and Neurology were unable to find a specific cause, he notes doing OK with her current regimen.

## 2017-09-09 NOTE — Assessment & Plan Note (Signed)
HPI: He has continued to have low back pain, notes that when walking upright for >1 block he will have some numbness and tingling from buttock down mostly in b/l thighs, has to stop and rest and improves.  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  He has previously seen [place] [facility] who obtained MRI but was overall concerned about hyperreflexia and sent back to neurology with dr Jannifer Franklin who was less concerned about this.  It appears he may never have followed up with Orthopedics, I have encouraged him to do so to discuss options.

## 2017-09-22 ENCOUNTER — Other Ambulatory Visit: Payer: Self-pay | Admitting: Adult Health

## 2017-11-11 DIAGNOSIS — L814 Other melanin hyperpigmentation: Secondary | ICD-10-CM | POA: Diagnosis not present

## 2017-11-11 DIAGNOSIS — L821 Other seborrheic keratosis: Secondary | ICD-10-CM | POA: Diagnosis not present

## 2017-11-11 DIAGNOSIS — Z85828 Personal history of other malignant neoplasm of skin: Secondary | ICD-10-CM | POA: Diagnosis not present

## 2017-11-11 DIAGNOSIS — L570 Actinic keratosis: Secondary | ICD-10-CM | POA: Diagnosis not present

## 2017-12-02 ENCOUNTER — Other Ambulatory Visit: Payer: Self-pay

## 2017-12-02 ENCOUNTER — Encounter: Payer: Self-pay | Admitting: Internal Medicine

## 2017-12-02 ENCOUNTER — Ambulatory Visit (INDEPENDENT_AMBULATORY_CARE_PROVIDER_SITE_OTHER): Payer: Medicare Other | Admitting: Internal Medicine

## 2017-12-02 DIAGNOSIS — K219 Gastro-esophageal reflux disease without esophagitis: Secondary | ICD-10-CM | POA: Diagnosis not present

## 2017-12-02 DIAGNOSIS — Z87891 Personal history of nicotine dependence: Secondary | ICD-10-CM | POA: Diagnosis not present

## 2017-12-02 DIAGNOSIS — J449 Chronic obstructive pulmonary disease, unspecified: Secondary | ICD-10-CM | POA: Diagnosis not present

## 2017-12-02 DIAGNOSIS — N182 Chronic kidney disease, stage 2 (mild): Secondary | ICD-10-CM

## 2017-12-02 DIAGNOSIS — G8929 Other chronic pain: Secondary | ICD-10-CM | POA: Diagnosis not present

## 2017-12-02 DIAGNOSIS — G4700 Insomnia, unspecified: Secondary | ICD-10-CM | POA: Diagnosis not present

## 2017-12-02 DIAGNOSIS — E785 Hyperlipidemia, unspecified: Secondary | ICD-10-CM | POA: Diagnosis not present

## 2017-12-02 DIAGNOSIS — B9789 Other viral agents as the cause of diseases classified elsewhere: Principal | ICD-10-CM

## 2017-12-02 DIAGNOSIS — J3489 Other specified disorders of nose and nasal sinuses: Secondary | ICD-10-CM

## 2017-12-02 DIAGNOSIS — J069 Acute upper respiratory infection, unspecified: Secondary | ICD-10-CM

## 2017-12-02 DIAGNOSIS — M545 Low back pain: Secondary | ICD-10-CM

## 2017-12-02 DIAGNOSIS — R05 Cough: Secondary | ICD-10-CM | POA: Diagnosis not present

## 2017-12-02 DIAGNOSIS — G3184 Mild cognitive impairment, so stated: Secondary | ICD-10-CM | POA: Diagnosis not present

## 2017-12-02 NOTE — Patient Instructions (Addendum)
It was a pleasure to see Mr. [name].  Your symptoms are consistent with a viral upper respiratory tract infection.  Please continue her supportive care and keeping up with hydration.  I do not think you have a pneumonia at this time.  Please call us if you start to have fevers, chills, sweats, or shortness of breath.  Please follow-up with Dr. Heber Wilson in 3 to 6 months or see [place] sooner if needed.

## 2017-12-02 NOTE — Assessment & Plan Note (Signed)
Patient reports developing a cough with an itchy/sore throat 4 days ago.  He describes a sensation of a tickle in the back of his throat.  He has also had nasal and chest congestion.  He reports chronic rhinorrhea secondary to his Namenda which is unchanged.  He has been using Robitussin-DM and drinking hot tea and water to try to keep up with his fluids.  He says he has been slowly improving and so far has felt the best this morning since this began.  He has obstructive lung disease and has an as needed albuterol inhaler which he says he has not felt the need to use for several years.  He denies any fevers, chills, diaphoresis, myalgias, ear pain or stuffiness, postnasal drip, heartburn, chest pain, or shortness of breath.  A/P: Patient symptoms are consistent with a viral upper respiratory tract infection which appears to be improving.  I recommended he continue supportive care and have low suspicion for pneumonia at this time.  I have advised him to call us if he experiences any new symptoms of fevers, chills, sweats, shortness of breath, or purulent sputum production.

## 2017-12-02 NOTE — Progress Notes (Signed)
   CC: URI  HPI:  [name] is a 77 y.o. male with PMH as listed below including COPD, mild cognitive impairment, GERD, hyperlipidemia, CKD2, chronic low back pain, and insomnia who presents for evaluation of upper respiratory tract symptoms.  Please see problem based charting for status of patient's chronic medical issues.  Viral URI with cough Patient reports developing a cough with an itchy/sore throat 4 days ago.  He describes a sensation of a tickle in the back of his throat.  He has also had nasal and chest congestion.  He reports chronic rhinorrhea secondary to his Namenda which is unchanged.  He has been using Robitussin-DM and drinking hot tea and water to try to keep up with his fluids.  He says he has been slowly improving and so far has felt the best this morning since this began.  He has obstructive lung disease and has an as needed albuterol inhaler which he says he has not felt the need to use for several years.  He denies any fevers, chills, diaphoresis, myalgias, ear pain or stuffiness, postnasal drip, heartburn, chest pain, or shortness of breath.  A/P: Patient symptoms are consistent with a viral upper respiratory tract infection which appears to be improving.  I recommended he continue supportive care and have low suspicion for pneumonia at this time.  I have advised him to call us if he experiences any new symptoms of fevers, chills, sweats, shortness of breath, or purulent sputum production.    Past Medical History:  Diagnosis Date  . 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 Colonic polyp    Tubulovillous adenomatous  . COPD (chronic obstructive pulmonary disease) ([place])   . Delirium, acute 2015   After anesthesia for colectomy - resolved in 48hrs  . GERD (gastroesophageal reflux disease)   . Gout 12/2007   inadequate aspiration of MTP joint left great toe  06/18/08; -fluid:  red, bloody, cell count not performed (TNP) and no crystals or organisms seen.  . Hyperlipidemia   . Insomnia    . PERFORATION, INTESTINE 06/19/2006   Annotation: During colonoscopy Qualifier: History of  By: Oretha Ellis    . Tobacco abuse    quit 2001   Review of Systems:   Review of Systems  Constitutional: Negative for chills, diaphoresis, fever and malaise/fatigue.  HENT: Positive for congestion and sore throat. Negative for ear discharge, ear pain and sinus pain.   Respiratory: Positive for cough. Negative for sputum production and shortness of breath.   Cardiovascular: Negative for chest pain.  Musculoskeletal: Negative for myalgias.     Physical Exam:  Vitals:   12/02/17 1538  BP: (!) 135/59  Pulse: 70  Temp: 97.8 F (36.6 C)  TempSrc: Oral  SpO2: 100%  Weight: 129 lb 3.2 oz (58.6 kg)  Height: 5\' 6"  (1.676 m)   Physical Exam  Constitutional: No distress.  HENT:  Head: Normocephalic and atraumatic.  Nose: Rhinorrhea present. Right sinus exhibits no maxillary sinus tenderness and no frontal sinus tenderness. Left sinus exhibits no maxillary sinus tenderness and no frontal sinus tenderness.  Mouth/Throat: Oropharynx is clear and moist.  Cardiovascular: Normal rate and regular rhythm.  No murmur heard. Pulmonary/Chest: Effort normal. No respiratory distress.  Coarse expiratory breath sounds bilaterally  Skin: Skin is warm. He is not diaphoretic.     Assessment & Plan:   See Encounters Tab for problem based charting.  Patient discussed with Dr. Angelia Mould

## 2017-12-06 ENCOUNTER — Other Ambulatory Visit: Payer: Self-pay | Admitting: *Deleted

## 2017-12-06 DIAGNOSIS — K222 Esophageal obstruction: Principal | ICD-10-CM

## 2017-12-06 DIAGNOSIS — K219 Gastro-esophageal reflux disease without esophagitis: Secondary | ICD-10-CM

## 2017-12-06 MED ORDER — PANTOPRAZOLE SODIUM 40 MG PO TBEC: 40.0000 mg | DELAYED_RELEASE_TABLET | Freq: Every day | ORAL | 3 refills | Status: DC

## 2017-12-06 NOTE — Progress Notes (Signed)
Internal Medicine Clinic Attending  Case discussed with Dr. Patel at the time of the visit.  We reviewed the resident's history and exam and pertinent patient test results.  I agree with the assessment, diagnosis, and plan of care documented in the resident's note.  

## 2017-12-23 ENCOUNTER — Ambulatory Visit: Payer: Medicare Other | Admitting: Neurology

## 2017-12-24 ENCOUNTER — Other Ambulatory Visit: Payer: Self-pay | Admitting: Neurology

## 2017-12-24 NOTE — Telephone Encounter (Signed)
Xan[facility] due for a refill. Last ordered 09/23/17. [place] [facility] checked.

## 2017-12-27 ENCOUNTER — Other Ambulatory Visit: Payer: Self-pay | Admitting: *Deleted

## 2017-12-27 MED ORDER — TAMSULOSIN HCL 0.4 MG PO CAPS: 0.4000 mg | ORAL_CAPSULE | Freq: Every day | ORAL | 1 refills | Status: DC

## 2018-01-10 ENCOUNTER — Ambulatory Visit (INDEPENDENT_AMBULATORY_CARE_PROVIDER_SITE_OTHER): Payer: Medicare Other | Admitting: Neurology

## 2018-01-10 ENCOUNTER — Encounter: Payer: Self-pay | Admitting: Neurology

## 2018-01-10 ENCOUNTER — Telehealth: Payer: Self-pay | Admitting: Neurology

## 2018-01-10 VITALS — BP 110/59 | HR 64 | Ht 66.0 in | Wt 127.0 lb

## 2018-01-10 DIAGNOSIS — G3184 Mild cognitive impairment, so stated: Secondary | ICD-10-CM | POA: Diagnosis not present

## 2018-01-10 NOTE — Telephone Encounter (Signed)
Patient's daughter calling. Patient was seen by Dr. Jannifer Franklin today.and needs a recommendation to an ophthalmologist for double vision.

## 2018-01-10 NOTE — Telephone Encounter (Signed)
I called and left a message.  I would recommend someone from Dr. Payton Emerald group for someone from Dr. Jolyne Loa group.

## 2018-01-10 NOTE — Progress Notes (Signed)
Reason for visit: Memory disturbance, resting tremor  GEROGE Gallagher is an 77 y.o. male  History of present illness:  [name] is a 77 year old right-handed white male with a history of a mild memory disturbance.  The patient currently lives alone, his daughter will check on him regularly.  The patient believes that there has been some slight progression of his memory over time, he remains on Aricept and Namenda.  He reports troubles with ongoing runny nose and excessive drooling.  The patient has developed a resting tremor within the last 2 years affecting mainly the right arm but slightly affecting the left arm as well.  He denies any tremor affecting the legs.  The patient has had sloppy handwriting, he denies any changes in his ability to walk or get around.  He has not noted any freezing.  He does have some restriction of his physical abilities due to low back pain that comes on when he stands up, he may have discomfort down both legs.  He has been seen through orthopedic surgery previously for this.  The patient returns to this office for an evaluation.  Past Medical History:  Diagnosis Date  . 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 Colonic polyp    Tubulovillous adenomatous  . COPD (chronic obstructive pulmonary disease) ([place])   . Delirium, acute 2015   After anesthesia for colectomy - resolved in 48hrs  . GERD (gastroesophageal reflux disease)   . Gout 12/2007   inadequate aspiration of MTP joint left great toe  06/18/08; -fluid:  red, bloody, cell count not performed (TNP) and no crystals or organisms seen.  . Hyperlipidemia   . Insomnia   . PERFORATION, INTESTINE 06/19/2006   Annotation: During colonoscopy Qualifier: History of  By: Oretha Ellis    . Tobacco abuse    quit 2001    Past Surgical History:  Procedure Laterality Date  . CATARACT EXTRACTION Bilateral   . COLONOSCOPY    . DUPUYTREN CONTRACTURE RELEASE Right   . LAPAROSCOPIC PARTIAL COLECTOMY Right 01/18/2014   Procedure:  LAPAROSCOPIC PARTIAL RIGHT COLECTOMY;  Surgeon: Adin Hector, MD;  Location: [facility];  Service: General;  Laterality: Right;  . NECK SURGERY  1970   removal cyst outside neck  . WRIST SURGERY Left     Family History  Problem Relation Age of Onset  . Cerebral aneurysm Mother   . Cancer Father        lung cancer/asbestos  . Colon cancer Neg Hx   . Pancreatic cancer Neg Hx   . Stomach cancer Neg Hx   . Rectal cancer Neg Hx     Social history:  reports that he quit smoking about 17 years ago. His smoking use included cigarettes. He has never used smokeless tobacco. He reports that he does not drink alcohol or use drugs.    Allergies  Allergen Reactions  . Zoloft [Sertraline Hcl]     Tremors, blurred vision  . Ambien [Zolpidem Tartrate] Other (See Comments)    paranoia    Medications:  Prior to Admission medications   Medication Sig Start Date End Date Taking? Authorizing Provider  albuterol (PROVENTIL HFA;VENTOLIN HFA) 108 (90 Base) MCG/ACT inhaler Inhale 1-2 puffs into the lungs every 6 (six) hours as needed for wheezing or shortness of breath.   Yes [provider]  ALPRAZolam Duanne Moron) 0.5 MG tablet TAKE 1 TABLET BY MOUTH AT BEDTIME AS NEEDED FOR ANXIETY 12/24/17  Yes Ward Givens, NP  aspirin 81 MG tablet  Take 81 mg by mouth every evening.    Yes [provider]  atorvastatin (LIPITOR) 40 MG tablet Take 1 tablet (40 mg total) by mouth daily. 05/27/17 05/27/18 Yes Lucious Groves, DO  cetirizine (ZYRTEC) 10 MG tablet Take 10 mg by mouth 2 (two) times daily.   Yes [provider]  donepezil (ARICEPT) 10 MG tablet TAKE 1 TABLET BY MOUTH AT BEDTIME 12/24/17  Yes Ward Givens, NP  memantine (NAMENDA) 10 MG tablet TAKE ONE TABLET BY MOUTH TWICE DAILY 07/07/17  Yes Kathrynn Ducking, MD  Multiple Vitamin (MULTIVITAMIN) tablet Take 1 tablet by mouth daily.   Yes [provider]  naproxen sodium (ANAPROX) 220 MG tablet Take 220 mg by mouth as  needed.   Yes [provider]  Omega 3 1000 MG CAPS Take 1 capsule by mouth daily.   Yes [provider]  pantoprazole (PROTONIX) 40 MG tablet Take 1 tablet (40 mg total) by mouth daily. 12/06/17  Yes Lucious Groves, DO  Polyethyl Glycol-Propyl Glycol (SYSTANE) 0.4-0.3 % SOLN Place 1 drop into both eyes 2 (two) times daily.   Yes [provider]  sildenafil (VIAGRA) 100 MG tablet Take 1 tablet (100 mg total) by mouth as needed for erectile dysfunction. 04/08/17 04/08/18 Yes Lucious Groves, DO  tamsulosin (FLOMAX) 0.4 MG CAPS capsule Take 1 capsule (0.4 mg total) by mouth daily. 12/27/17  Yes Oda Kilts, MD  triamcinolone cream (KENALOG) 0.1 %  05/22/16  Yes [provider]    ROS:  Out of a complete 14 system review of symptoms, the patient complains only of the following symptoms, and all other reviewed systems are negative.  Fatigue Runny nose, drooling Light sensitivity, double vision, blurred vision Shortness of breath Environmental allergies Back pain, aching muscles, muscle cramps, walking difficulty Itching Memory loss, dizziness, weakness, tremors Anxiety  Blood pressure (!) 110/59, pulse 64, height 5\' 6"  (1.676 m), weight 127 lb (57.6 kg), SpO2 95 %.  Physical Exam  General: The patient is alert and cooperative at the time of the examination.  Skin: No significant peripheral edema is noted.   Neurologic Exam  Mental status: The patient is alert and oriented x 3 at the time of the examination. The patient has apparent normal recent and remote memory, with an apparently normal attention span and concentration ability.  Mini-Mental status examination done today shows a total score 30/30.   Cranial nerves: Facial symmetry is present. Speech is slightly dysphonic, not aphasic. Extraocular movements are full. Visual fields are full.  Very mild masking of the face is seen.  Motor: The patient has good strength in all 4  extremities.  Sensory examination: Soft touch sensation is symmetric on the face, arms, and legs.  Coordination: The patient has good finger-nose-finger and heel-to-shin bilaterally.  A resting tremor was not observed.  Gait and station: The patient has a slightly stooped posture, the patient is able to ambulate independently with good stride and good turns.  Decreased arm swing is seen bilaterally, right greater than left.  Tandem gait is minimally unsteady.  Romberg is negative. No drift is seen.  Reflexes: Deep tendon reflexes are symmetric.   Assessment/Plan:  1.  Mild memory disturbance  2.  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  The patient will not be placed on medications currently.  He will continue Aricept and Namenda for memory problems.  The currently lives alone, drives  a car, manages his own medications, appointments, and finances.  He will follow-up in 6 months.  Jill Alexanders MD 01/10/2018 10:51 AM  [facility] [address] [place], [place] [address]  Phone [phone] Fax [phone]

## 2018-02-09 DIAGNOSIS — H35362 Drusen (degenerative) of macula, left eye: Secondary | ICD-10-CM | POA: Diagnosis not present

## 2018-02-09 DIAGNOSIS — Z961 Presence of intraocular lens: Secondary | ICD-10-CM | POA: Diagnosis not present

## 2018-02-09 DIAGNOSIS — H532 Diplopia: Secondary | ICD-10-CM | POA: Diagnosis not present

## 2018-02-09 DIAGNOSIS — H26491 Other secondary cataract, right eye: Secondary | ICD-10-CM | POA: Diagnosis not present

## 2018-03-25 ENCOUNTER — Other Ambulatory Visit: Payer: Self-pay | Admitting: Adult Health

## 2018-05-23 ENCOUNTER — Other Ambulatory Visit: Payer: Self-pay

## 2018-05-23 MED ORDER — ATORVASTATIN CALCIUM 40 MG PO TABS: 40.0000 mg | ORAL_TABLET | Freq: Every day | ORAL | 3 refills | Status: DC

## 2018-05-23 NOTE — Telephone Encounter (Signed)
Appt sch with PCP on 07/28/2018.

## 2018-05-27 DIAGNOSIS — Z23 Encounter for immunization: Secondary | ICD-10-CM | POA: Diagnosis not present

## 2018-06-21 ENCOUNTER — Other Ambulatory Visit: Payer: Self-pay | Admitting: Neurology

## 2018-07-04 ENCOUNTER — Other Ambulatory Visit: Payer: Self-pay | Admitting: Internal Medicine

## 2018-07-04 ENCOUNTER — Other Ambulatory Visit: Payer: Self-pay | Admitting: Neurology

## 2018-07-14 ENCOUNTER — Ambulatory Visit (INDEPENDENT_AMBULATORY_CARE_PROVIDER_SITE_OTHER): Payer: Medicare Other | Admitting: Neurology

## 2018-07-14 ENCOUNTER — Encounter: Payer: Self-pay | Admitting: Neurology

## 2018-07-14 VITALS — BP 112/59 | HR 66 | Ht 66.0 in | Wt 127.0 lb

## 2018-07-14 DIAGNOSIS — G20 Parkinson's disease: Secondary | ICD-10-CM

## 2018-07-14 DIAGNOSIS — I951 Orthostatic hypotension: Secondary | ICD-10-CM

## 2018-07-14 DIAGNOSIS — G3184 Mild cognitive impairment, so stated: Secondary | ICD-10-CM

## 2018-07-14 DIAGNOSIS — G20A1 Parkinson's disease without dyskinesia, without mention of fluctuations: Secondary | ICD-10-CM

## 2018-07-14 HISTORY — DX: Orthostatic hypotension: I95.1

## 2018-07-14 HISTORY — DX: Parkinson's disease without dyskinesia, without mention of fluctuations: G20.A1

## 2018-07-14 HISTORY — DX: Parkinson's disease: G20

## 2018-07-14 MED ORDER — CARBIDOPA-LEVODOPA 25-100 MG PO TABS: 0.5000 | ORAL_TABLET | Freq: Three times a day (TID) | ORAL | 1 refills | Status: AC

## 2018-07-14 NOTE — Patient Instructions (Signed)
We will start Sinemet for the Parkinson's disease.  Sinemet (carbidopa) may result in confusion or hallucinations, drowsiness, nausea, or dizziness. If any significant side effects are noted, please contact our office. Sinemet may not be well absorbed when taken with high protein meals, if tolerated it is best to take 30-45 minutes before you eat.

## 2018-07-14 NOTE — Progress Notes (Signed)
Reason for visit: Memory disturbance, Parkinson's disease  [name] is an 78 y.o. male  History of present illness:  [name] is a 78 year old right-handed white male with a history of a mild memory disturbance.  The patient is on Aricept and Namenda.  Over the last several months he has developed some tremors that affects primarily the right arm, he has had some alteration in his walking.  The patient is drooling some, he has a runny nose on Aricept.  He believes that his memory has gradually gotten worse over time.  He currently is living alone, but he is thinking about moving in with his daughter who lives in [place], [place].  She is here today with him.  The patient has not had any falls, he is getting some dizziness when he stands up at times.  He also has bilateral hip pain when he walks a certain distance, recent myelogram with CT to follow did not show spinal stenosis in the low back.  The patient has alleviation of the hip pain when he sits down.  The patient returns to this office for an evaluation.  Past Medical History:  Diagnosis Date  . 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 Colonic polyp    Tubulovillous adenomatous  . COPD (chronic obstructive pulmonary disease) ([place])   . Delirium, acute 2015   After anesthesia for colectomy - resolved in 48hrs  . GERD (gastroesophageal reflux disease)   . Gout 12/2007   inadequate aspiration of MTP joint left great toe  06/18/08; -fluid:  red, bloody, cell count not performed (TNP) and no crystals or organisms seen.  . Hyperlipidemia   . Insomnia   . PERFORATION, INTESTINE 06/19/2006   Annotation: During colonoscopy Qualifier: History of  By: Oretha Ellis    . Tobacco abuse    quit 2001    Past Surgical History:  Procedure Laterality Date  . CATARACT EXTRACTION Bilateral   . COLONOSCOPY    . DUPUYTREN CONTRACTURE RELEASE Right   . LAPAROSCOPIC PARTIAL COLECTOMY Right 01/18/2014   Procedure: LAPAROSCOPIC PARTIAL RIGHT COLECTOMY;   Surgeon: Adin Hector, MD;  Location: [facility];  Service: General;  Laterality: Right;  . NECK SURGERY  1970   removal cyst outside neck  . WRIST SURGERY Left     Family History  Problem Relation Age of Onset  . Cerebral aneurysm Mother   . Cancer Father        lung cancer/asbestos  . Colon cancer Neg Hx   . Pancreatic cancer Neg Hx   . Stomach cancer Neg Hx   . Rectal cancer Neg Hx     Social history:  reports that he quit smoking about 17 years ago. His smoking use included cigarettes. He has never used smokeless tobacco. He reports that he does not drink alcohol or use drugs.    Allergies  Allergen Reactions  . Zoloft [Sertraline Hcl]     Tremors, blurred vision  . Ambien [Zolpidem Tartrate] Other (See Comments)    paranoia    Medications:  Prior to Admission medications   Medication Sig Start Date End Date Taking? Authorizing Provider  albuterol (PROVENTIL HFA;VENTOLIN HFA) 108 (90 Base) MCG/ACT inhaler Inhale 1-2 puffs into the lungs every 6 (six) hours as needed for wheezing or shortness of breath.   Yes [provider]  ALPRAZolam Duanne Moron) 0.5 MG tablet TAKE 1 TABLET BY MOUTH AT BEDTIME AS NEEDED FOR ANXIETY 06/21/18  Yes Kathrynn Ducking, MD  aspirin 81  MG tablet Take 81 mg by mouth every evening.    Yes [provider]  atorvastatin (LIPITOR) 40 MG tablet Take 1 tablet (40 mg total) by mouth daily. 05/23/18 05/23/19 Yes Lucious Groves, DO  cetirizine (ZYRTEC) 10 MG tablet Take 10 mg by mouth 2 (two) times daily.   Yes [provider]  donepezil (ARICEPT) 10 MG tablet TAKE 1 TABLET BY MOUTH AT BEDTIME 12/24/17  Yes [place], Jinny Blossom, NP  memantine (NAMENDA) 10 MG tablet TAKE 1 TABLET BY MOUTH TWICE DAILY 07/04/18  Yes Kathrynn Ducking, MD  Multiple Vitamin (MULTIVITAMIN) tablet Take 1 tablet by mouth daily.   Yes [provider]  naproxen sodium (ANAPROX) 220 MG tablet Take 220 mg by mouth as needed.   Yes [provider]    Omega 3 1000 MG CAPS Take 1 capsule by mouth daily.   Yes [provider]  pantoprazole (PROTONIX) 40 MG tablet Take 1 tablet (40 mg total) by mouth daily. 12/06/17  Yes Lucious Groves, DO  Polyethyl Glycol-Propyl Glycol (SYSTANE) 0.4-0.3 % SOLN Place 1 drop into both eyes 2 (two) times daily.   Yes [provider]  tamsulosin (FLOMAX) 0.4 MG CAPS capsule TAKE 1 CAPSULE BY MOUTH ONCE DAILY 07/05/18  Yes Lucious Groves, DO    ROS:  Out of a complete 14 system review of symptoms, the patient complains only of the following symptoms, and all other reviewed systems are negative.  Fatigue Runny nose, drooling Light sensitivity, double vision, loss of vision, blurred vision Back pain, muscle cramps, walking difficulty Itching Memory loss, dizziness, weakness, tremors Agitation, confusion, decreased concentration, depression, anxiety  Blood pressure (!) 112/59, pulse 66, height 5\' 6"  (1.676 m), weight 127 lb (57.6 kg).   Blood pressure, right arm, sitting is 112/50.  Blood pressure, right arm, standing is 84 systolic.  Physical Exam  General: The patient is alert and cooperative at the time of the examination.  Mild masking of the face is seen.  Skin: No significant peripheral edema is noted.   Neurologic Exam  Mental status: The patient is alert and oriented x 3 at the time of the examination. The patient has apparent normal recent and remote memory, with an apparently normal attention span and concentration ability.  Mini-Mental status examination done today shows a total score of 30/30.   Cranial nerves: Facial symmetry is present. Speech is normal, no aphasia or dysarthria is noted. Extraocular movements are full. Visual fields are full.  Motor: The patient has good strength in all 4 extremities.  Sensory examination: Soft touch sensation is symmetric on the face, arms, and legs.  Coordination: The patient has good finger-nose-finger and heel-to-shin  bilaterally.  Resting tremors are noted on the right upper extremity.  Gait and station: The patient is able to rise from a seated position with arms crossed.  Once up, the patient is able to ambulate independently, decreased arm swing is seen bilaterally, more significant on the right.  Tandem gait is slightly unsteady.  Romberg is negative.  There is some shuffling with turns.  Reflexes: Deep tendon reflexes are symmetric.   Assessment/Plan:  1.  Parkinson's disease  2.  Memory disorder  3.  Orthostatic hypotension  The patient will be started on Sinemet in low-dose taking 25/100 mg tablets, 1/2 tablet 3 times daily.  He will follow-up in 4 months.  He will call for any problems on the medication or any dose adjustments.  He does appear to have some  orthostatic hypotension.   Jill Alexanders MD 07/14/2018 1:36 PM  [facility] [address] [place], [place] [address]  Phone [phone] Fax [phone]

## 2018-07-28 ENCOUNTER — Encounter: Payer: Medicare Other | Admitting: Internal Medicine

## 2018-10-03 ENCOUNTER — Telehealth: Payer: Self-pay | Admitting: Neurology

## 2018-10-03 DIAGNOSIS — G20 Parkinson's disease: Secondary | ICD-10-CM

## 2018-10-03 NOTE — Telephone Encounter (Signed)
I called the patient.  The patient has moved to [place], [place].  1 of our prior partners, Dr. Roxy Horseman is now down in that area.  He is not actually in [place] but just outside, if the patient wishes a referral, he will let me know.  Otherwise, we can refer him to the [facility] group.

## 2018-10-03 NOTE — Telephone Encounter (Signed)
Pt called in and stated he has moved and wants to know if Dr Jannifer Franklin knows a Chief Executive Officer in [place]

## 2018-10-10 NOTE — Telephone Encounter (Signed)
I returned the pt's call and spoke with him and his daughter Dola Argyle. Pt wanted to inform Dr. Jannifer Franklin Dr. Estella Husk is located 2 hours away from his address and was able to locate a Dr. Bernarda Caffey at [facility] who would take his insurance and is closer to his house.   Practice information for Dr. Thornton Papas: Phone # [phone] and Fax # [phone].  Pt wanted to know if Dr. Jannifer Franklin was agreeable if we could send referral to Dr. Thornton Papas or if Dr. Jannifer Franklin had another recommendation? I advised I would send info to Dr. Jannifer Franklin to review and pt was agreeable.

## 2018-10-10 NOTE — Telephone Encounter (Signed)
Pt requesting a call from RN to discuss his move- would also like to discuss a Doctor they found along with Dr. Roxy Horseman please advise

## 2018-10-10 NOTE — Addendum Note (Signed)
Addended by: Kathrynn Ducking on: 10/10/2018 04:38 PM   Modules accepted: Orders

## 2018-10-10 NOTE — Telephone Encounter (Signed)
Okay to send referral to Dr. Thornton Papas.

## 2018-10-13 ENCOUNTER — Ambulatory Visit (INDEPENDENT_AMBULATORY_CARE_PROVIDER_SITE_OTHER): Payer: Medicare Other | Admitting: Internal Medicine

## 2018-10-13 ENCOUNTER — Other Ambulatory Visit: Payer: Self-pay

## 2018-10-13 DIAGNOSIS — L299 Pruritus, unspecified: Secondary | ICD-10-CM | POA: Diagnosis not present

## 2018-10-13 DIAGNOSIS — F419 Anxiety disorder, unspecified: Secondary | ICD-10-CM

## 2018-10-13 DIAGNOSIS — R399 Unspecified symptoms and signs involving the genitourinary system: Secondary | ICD-10-CM | POA: Diagnosis not present

## 2018-10-13 DIAGNOSIS — Z87891 Personal history of nicotine dependence: Secondary | ICD-10-CM

## 2018-10-13 DIAGNOSIS — G20 Parkinson's disease: Secondary | ICD-10-CM

## 2018-10-13 DIAGNOSIS — Z79899 Other long term (current) drug therapy: Secondary | ICD-10-CM | POA: Diagnosis not present

## 2018-10-13 DIAGNOSIS — G3184 Mild cognitive impairment, so stated: Secondary | ICD-10-CM | POA: Diagnosis not present

## 2018-10-13 MED ORDER — MEMANTINE HCL 10 MG PO TABS: 10.0000 mg | ORAL_TABLET | Freq: Two times a day (BID) | ORAL | 0 refills | Status: DC

## 2018-10-13 NOTE — Assessment & Plan Note (Signed)
He reports he is doing well on tamsulosin no issues with retention or dribbling.

## 2018-10-13 NOTE — Assessment & Plan Note (Signed)
He reports he is doing well with his Xanax prescription.  Anxiety is currently well controlled.  In the interim he has moved down to [place] to live with his daughter.  He likely will reestablish primary care and neurologic care down in [place].  He asked about refills for his Xanax I discussed that it would probably best to have this continued to be refilled by Dr. Jannifer Franklin however if he is unable to get back for a visit I could refill this if needed until he reestablished primary care.

## 2018-10-13 NOTE — Assessment & Plan Note (Signed)
HPI: He continues to have the issue with chronic itching.  Mainly just suffers through it.  Assessment chronic pruritus  Plan I went through all of his medications with him most of these have been chronic he is seen neurology and dermatology without findings for the cause of his itching.  Treatment right now is mainly supportive care.  I discussed his medications at length and discussed that he certainly could do trials of his medications and symptoms to look out for during that but that I would do one at a time.  [name] if he has any other questions about this.

## 2018-10-13 NOTE — Assessment & Plan Note (Signed)
HPI he sees Dr. Jannifer Franklin for this.  He is currently taking Namenda and Aricept.  He continues to have some issues with runny nose and drooling but reports these are mild.  Assessment mild cognitive impairment, question early Alzheimer's versus due to Parkinson's dementia  Plan Discussed continuing Namenda and Aricept.  I also discussed that he could talk with Dr. Jannifer Franklin about Exelon to see if that decreased his side effects.  However he reports he is are mild and not bothersome.

## 2018-10-13 NOTE — Assessment & Plan Note (Signed)
HPI: He has now been started on Sinemet for his Parkinson's disease.  He reports his tremor is much reduced and he feels like his gait is also improved.  Assessment Parkinson's disease  Plan Continue Sinemet.

## 2018-10-13 NOTE — Progress Notes (Signed)
  [facility] Telephone Encounter Continuity Care Appointment  HPI:   This telephone encounter was created for Mr. [name] on 10/13/2018 for the following purpose/cc parkinsons disease, puritis.   Past Medical History:  Past Medical History:  Diagnosis Date  . 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 Colonic polyp    Tubulovillous adenomatous  . COPD (chronic obstructive pulmonary disease) ([place])   . Delirium, acute 2015   After anesthesia for colectomy - resolved in 48hrs  . GERD (gastroesophageal reflux disease)   . Gout 12/2007   inadequate aspiration of MTP joint left great toe  06/18/08; -fluid:  red, bloody, cell count not performed (TNP) and no crystals or organisms seen.  . Hyperlipidemia   . Insomnia   . Orthostatic hypotension 07/14/2018  . Parkinson's disease ([place]) 07/14/2018  . PERFORATION, INTESTINE 06/19/2006   Annotation: During colonoscopy Qualifier: History of  By: Oretha Ellis    . Tobacco abuse    quit 2001      ROS:  Review of Systems  Constitutional: Negative for fever and malaise/fatigue.  Respiratory: Negative for cough and shortness of breath.   Cardiovascular: Negative for chest pain.  Genitourinary: Negative for frequency and hematuria.  Musculoskeletal: Negative for myalgias.  Skin: Positive for itching. Negative for rash.  Neurological: Negative for dizziness.       Assessment / Plan / Recommendations:   Please see A&P under problem oriented charting for assessment of the patient's acute and chronic medical conditions.     Consent and Medical Decision Making:     This is a telephone encounter between [employer] and [name] on 10/13/2018 for parkinsons disease, puritits. The visit was conducted with the patient located at home and [name] at [facility]. The patient's identity was confirmed using their DOB and current address. The patient has consented to being evaluated through a telephone encounter and understands  the associated risks (an examination cannot be done and the patient may need to come in for an appointment) / benefits (allows the patient to remain at home, decreasing exposure to coronavirus). I personally spent 13 minutes on medical discussion.

## 2018-11-16 DIAGNOSIS — H35371 Puckering of macula, right eye: Secondary | ICD-10-CM | POA: Diagnosis not present

## 2018-11-16 DIAGNOSIS — H527 Unspecified disorder of refraction: Secondary | ICD-10-CM | POA: Diagnosis not present

## 2018-11-16 DIAGNOSIS — H43813 Vitreous degeneration, bilateral: Secondary | ICD-10-CM | POA: Diagnosis not present

## 2018-11-16 DIAGNOSIS — H02831 Dermatochalasis of right upper eyelid: Secondary | ICD-10-CM | POA: Diagnosis not present

## 2018-11-23 ENCOUNTER — Encounter: Payer: Self-pay | Admitting: Gastroenterology

## 2018-11-30 ENCOUNTER — Telehealth: Payer: Self-pay | Admitting: *Deleted

## 2018-11-30 NOTE — Telephone Encounter (Signed)
I have contacted patient to advise that Dr Silverio Decamp has reviewed his chart and feels it is time for him to schedule recall colonoscopy due to his previous history of adenomatous colon polyps. Patient indicates that he has moved to [place], [place] and will be unable to move forward with colonoscopy here in [place]. I have reminded him how important follow up is and have asked that he find a GI physician in [place] to perform colonoscopy on him, possibly by talking to his primary care physician with whom he has already established. Patient verbalizes understanding of this.

## 2018-12-05 ENCOUNTER — Other Ambulatory Visit: Payer: Self-pay | Admitting: *Deleted

## 2018-12-05 DIAGNOSIS — K222 Esophageal obstruction: Secondary | ICD-10-CM

## 2018-12-05 DIAGNOSIS — K219 Gastro-esophageal reflux disease without esophagitis: Secondary | ICD-10-CM

## 2018-12-05 MED ORDER — PANTOPRAZOLE SODIUM 40 MG PO TBEC: 40.0000 mg | DELAYED_RELEASE_TABLET | Freq: Every day | ORAL | 3 refills | Status: AC

## 2018-12-13 DIAGNOSIS — R413 Other amnesia: Secondary | ICD-10-CM | POA: Diagnosis not present

## 2018-12-13 DIAGNOSIS — G20 Parkinson's disease: Secondary | ICD-10-CM | POA: Diagnosis not present

## 2019-01-03 ENCOUNTER — Other Ambulatory Visit: Payer: Self-pay | Admitting: Adult Health

## 2019-01-03 ENCOUNTER — Other Ambulatory Visit: Payer: Self-pay

## 2019-01-03 ENCOUNTER — Telehealth: Payer: Self-pay | Admitting: *Deleted

## 2019-01-03 NOTE — Telephone Encounter (Signed)
   ALPRAZolam Duanne Moron) 0.5 MG tablet   Refill request @  [place], [place] [phone] (Phone) [phone] (Fax)   Pt's states her father moved with her to [place], requesting a one month supply. Pt have an appt with new PCP 01/22/2019 in [place].

## 2019-01-03 NOTE — Telephone Encounter (Signed)
Received request for donezepil. I noted that he has moved.  Appt on 01-19-19 with Dr. Jannifer Franklin.  I called pt and asked about appt and he stated he thought he cancelled it.  I did cancel the appt.  He has appt with pcp 01-25-19.  I relayed that will fill donezepil for 3 months at [facility] in [place], [place] [phone] market).  He was appreciative.

## 2019-01-04 ENCOUNTER — Telehealth: Payer: Self-pay | Admitting: Neurology

## 2019-01-04 MED ORDER — ALPRAZOLAM 0.5 MG PO TABS: ORAL_TABLET | ORAL | 0 refills | Status: AC

## 2019-01-04 NOTE — Telephone Encounter (Signed)
I reached out to the pt's daughter, she requested xanax rx to be sent to [facility] in [place]. I was able to advise Dr. Heber Fedora prescribed the medication today at 1250 to the requested pharmacy.  Daughter verbalized understanding and appreciation for the follow up call.

## 2019-01-04 NOTE — Telephone Encounter (Signed)
Pt's daughter states that pt has moved in with her in [place] but is needing a refill on his ALPRAZolam [name]) 0.5 MG tablet Daughter would like the RN to call her to see how this can happen. Please advise.

## 2019-01-09 ENCOUNTER — Telehealth: Payer: Self-pay | Admitting: Neurology

## 2019-01-09 ENCOUNTER — Other Ambulatory Visit: Payer: Self-pay | Admitting: Neurology

## 2019-01-09 MED ORDER — DONEPEZIL HCL 10 MG PO TABS: 10.0000 mg | ORAL_TABLET | Freq: Every day | ORAL | 0 refills | Status: AC

## 2019-01-09 NOTE — Telephone Encounter (Signed)
Rx has been sent  

## 2019-01-09 NOTE — Telephone Encounter (Signed)
Pt is needing a refill for his donepezil (ARICEPT) 10 MG tablet sent to the [facility] in [place].

## 2019-01-19 ENCOUNTER — Encounter

## 2019-01-19 ENCOUNTER — Ambulatory Visit: Payer: Medicare Other | Admitting: Neurology

## 2019-01-24 DIAGNOSIS — I739 Peripheral vascular disease, unspecified: Secondary | ICD-10-CM | POA: Diagnosis not present

## 2019-01-24 DIAGNOSIS — K9171 Accidental puncture and laceration of a digestive system organ or structure during a digestive system procedure: Secondary | ICD-10-CM | POA: Diagnosis not present

## 2019-01-24 DIAGNOSIS — N400 Enlarged prostate without lower urinary tract symptoms: Secondary | ICD-10-CM | POA: Diagnosis not present

## 2019-01-24 DIAGNOSIS — F5101 Primary insomnia: Secondary | ICD-10-CM | POA: Diagnosis not present

## 2019-01-24 DIAGNOSIS — K631 Perforation of intestine (nontraumatic): Secondary | ICD-10-CM | POA: Diagnosis not present

## 2019-01-31 DIAGNOSIS — I739 Peripheral vascular disease, unspecified: Secondary | ICD-10-CM | POA: Diagnosis not present

## 2019-03-04 DIAGNOSIS — Z23 Encounter for immunization: Secondary | ICD-10-CM | POA: Diagnosis not present

## 2019-03-17 DIAGNOSIS — G20 Parkinson's disease: Secondary | ICD-10-CM | POA: Diagnosis not present

## 2019-03-17 DIAGNOSIS — M48062 Spinal stenosis, lumbar region with neurogenic claudication: Secondary | ICD-10-CM | POA: Diagnosis not present

## 2019-03-20 ENCOUNTER — Other Ambulatory Visit: Payer: Self-pay | Admitting: Neurology

## 2019-04-03 ENCOUNTER — Other Ambulatory Visit: Payer: Self-pay | Admitting: Neurology

## 2019-04-08 DIAGNOSIS — M48062 Spinal stenosis, lumbar region with neurogenic claudication: Secondary | ICD-10-CM | POA: Diagnosis not present

## 2019-04-08 DIAGNOSIS — M47816 Spondylosis without myelopathy or radiculopathy, lumbar region: Secondary | ICD-10-CM | POA: Diagnosis not present

## 2019-04-08 DIAGNOSIS — G20 Parkinson's disease: Secondary | ICD-10-CM | POA: Diagnosis not present

## 2019-04-17 DIAGNOSIS — Z131 Encounter for screening for diabetes mellitus: Secondary | ICD-10-CM | POA: Diagnosis not present

## 2019-04-17 DIAGNOSIS — N400 Enlarged prostate without lower urinary tract symptoms: Secondary | ICD-10-CM | POA: Diagnosis not present

## 2019-04-17 DIAGNOSIS — Z125 Encounter for screening for malignant neoplasm of prostate: Secondary | ICD-10-CM | POA: Diagnosis not present

## 2019-04-17 DIAGNOSIS — I951 Orthostatic hypotension: Secondary | ICD-10-CM | POA: Diagnosis not present

## 2019-04-17 DIAGNOSIS — I739 Peripheral vascular disease, unspecified: Secondary | ICD-10-CM | POA: Diagnosis not present

## 2019-04-17 DIAGNOSIS — G20 Parkinson's disease: Secondary | ICD-10-CM | POA: Diagnosis not present

## 2019-04-24 ENCOUNTER — Other Ambulatory Visit: Payer: Self-pay | Admitting: Neurology

## 2019-04-26 DIAGNOSIS — I739 Peripheral vascular disease, unspecified: Secondary | ICD-10-CM | POA: Diagnosis not present

## 2019-04-26 DIAGNOSIS — F5101 Primary insomnia: Secondary | ICD-10-CM | POA: Diagnosis not present

## 2019-04-26 DIAGNOSIS — N400 Enlarged prostate without lower urinary tract symptoms: Secondary | ICD-10-CM | POA: Diagnosis not present

## 2019-05-26 ENCOUNTER — Other Ambulatory Visit: Payer: Self-pay | Admitting: *Deleted

## 2019-05-26 MED ORDER — ATORVASTATIN CALCIUM 40 MG PO TABS: 40.0000 mg | ORAL_TABLET | Freq: Every day | ORAL | 0 refills | Status: AC

## 2019-06-24 DIAGNOSIS — Z23 Encounter for immunization: Secondary | ICD-10-CM | POA: Diagnosis not present

## 2019-06-26 DIAGNOSIS — M79605 Pain in left leg: Secondary | ICD-10-CM | POA: Diagnosis not present

## 2019-06-26 DIAGNOSIS — I739 Peripheral vascular disease, unspecified: Secondary | ICD-10-CM | POA: Diagnosis not present

## 2019-06-26 DIAGNOSIS — M79604 Pain in right leg: Secondary | ICD-10-CM | POA: Diagnosis not present

## 2019-06-27 ENCOUNTER — Other Ambulatory Visit: Payer: Self-pay | Admitting: *Deleted

## 2019-06-28 DIAGNOSIS — J410 Simple chronic bronchitis: Secondary | ICD-10-CM | POA: Diagnosis not present

## 2019-06-28 DIAGNOSIS — I739 Peripheral vascular disease, unspecified: Secondary | ICD-10-CM | POA: Diagnosis not present

## 2019-06-28 DIAGNOSIS — G20 Parkinson's disease: Secondary | ICD-10-CM | POA: Diagnosis not present

## 2019-06-28 DIAGNOSIS — F5101 Primary insomnia: Secondary | ICD-10-CM | POA: Diagnosis not present

## 2019-06-30 DIAGNOSIS — J410 Simple chronic bronchitis: Secondary | ICD-10-CM | POA: Diagnosis not present

## 2019-06-30 DIAGNOSIS — G20 Parkinson's disease: Secondary | ICD-10-CM | POA: Diagnosis not present

## 2019-06-30 DIAGNOSIS — I739 Peripheral vascular disease, unspecified: Secondary | ICD-10-CM | POA: Diagnosis not present

## 2019-07-03 DIAGNOSIS — I739 Peripheral vascular disease, unspecified: Secondary | ICD-10-CM | POA: Diagnosis not present

## 2019-07-03 DIAGNOSIS — J410 Simple chronic bronchitis: Secondary | ICD-10-CM | POA: Diagnosis not present

## 2019-07-03 DIAGNOSIS — G20 Parkinson's disease: Secondary | ICD-10-CM | POA: Diagnosis not present

## 2019-07-04 DIAGNOSIS — G20 Parkinson's disease: Secondary | ICD-10-CM | POA: Diagnosis not present

## 2019-07-04 DIAGNOSIS — J410 Simple chronic bronchitis: Secondary | ICD-10-CM | POA: Diagnosis not present

## 2019-07-04 DIAGNOSIS — I739 Peripheral vascular disease, unspecified: Secondary | ICD-10-CM | POA: Diagnosis not present

## 2019-07-05 DIAGNOSIS — I739 Peripheral vascular disease, unspecified: Secondary | ICD-10-CM | POA: Diagnosis not present

## 2019-07-05 DIAGNOSIS — J410 Simple chronic bronchitis: Secondary | ICD-10-CM | POA: Diagnosis not present

## 2019-07-05 DIAGNOSIS — G20 Parkinson's disease: Secondary | ICD-10-CM | POA: Diagnosis not present

## 2019-07-06 DIAGNOSIS — G20 Parkinson's disease: Secondary | ICD-10-CM | POA: Diagnosis not present

## 2019-07-06 DIAGNOSIS — J410 Simple chronic bronchitis: Secondary | ICD-10-CM | POA: Diagnosis not present

## 2019-07-06 DIAGNOSIS — I739 Peripheral vascular disease, unspecified: Secondary | ICD-10-CM | POA: Diagnosis not present

## 2019-07-07 DIAGNOSIS — G20 Parkinson's disease: Secondary | ICD-10-CM | POA: Diagnosis not present

## 2019-07-07 DIAGNOSIS — J410 Simple chronic bronchitis: Secondary | ICD-10-CM | POA: Diagnosis not present

## 2019-07-07 DIAGNOSIS — I739 Peripheral vascular disease, unspecified: Secondary | ICD-10-CM | POA: Diagnosis not present

## 2019-07-11 DIAGNOSIS — I739 Peripheral vascular disease, unspecified: Secondary | ICD-10-CM | POA: Diagnosis not present

## 2019-07-11 DIAGNOSIS — J410 Simple chronic bronchitis: Secondary | ICD-10-CM | POA: Diagnosis not present

## 2019-07-11 DIAGNOSIS — G20 Parkinson's disease: Secondary | ICD-10-CM | POA: Diagnosis not present

## 2019-07-13 DIAGNOSIS — J410 Simple chronic bronchitis: Secondary | ICD-10-CM | POA: Diagnosis not present

## 2019-07-13 DIAGNOSIS — I739 Peripheral vascular disease, unspecified: Secondary | ICD-10-CM | POA: Diagnosis not present

## 2019-07-13 DIAGNOSIS — G20 Parkinson's disease: Secondary | ICD-10-CM | POA: Diagnosis not present

## 2019-07-15 DIAGNOSIS — Z23 Encounter for immunization: Secondary | ICD-10-CM | POA: Diagnosis not present

## 2019-07-18 DIAGNOSIS — I739 Peripheral vascular disease, unspecified: Secondary | ICD-10-CM | POA: Diagnosis not present

## 2019-07-18 DIAGNOSIS — J410 Simple chronic bronchitis: Secondary | ICD-10-CM | POA: Diagnosis not present

## 2019-07-18 DIAGNOSIS — G20 Parkinson's disease: Secondary | ICD-10-CM | POA: Diagnosis not present

## 2019-07-20 DIAGNOSIS — I739 Peripheral vascular disease, unspecified: Secondary | ICD-10-CM | POA: Diagnosis not present

## 2019-07-20 DIAGNOSIS — J410 Simple chronic bronchitis: Secondary | ICD-10-CM | POA: Diagnosis not present

## 2019-07-20 DIAGNOSIS — G20 Parkinson's disease: Secondary | ICD-10-CM | POA: Diagnosis not present

## 2019-07-25 DIAGNOSIS — J410 Simple chronic bronchitis: Secondary | ICD-10-CM | POA: Diagnosis not present

## 2019-07-25 DIAGNOSIS — I739 Peripheral vascular disease, unspecified: Secondary | ICD-10-CM | POA: Diagnosis not present

## 2019-07-25 DIAGNOSIS — G20 Parkinson's disease: Secondary | ICD-10-CM | POA: Diagnosis not present

## 2019-07-30 DIAGNOSIS — I739 Peripheral vascular disease, unspecified: Secondary | ICD-10-CM | POA: Diagnosis not present

## 2019-07-30 DIAGNOSIS — J410 Simple chronic bronchitis: Secondary | ICD-10-CM | POA: Diagnosis not present

## 2019-07-30 DIAGNOSIS — G20 Parkinson's disease: Secondary | ICD-10-CM | POA: Diagnosis not present

## 2019-08-01 DIAGNOSIS — M48062 Spinal stenosis, lumbar region with neurogenic claudication: Secondary | ICD-10-CM | POA: Diagnosis not present

## 2019-08-01 DIAGNOSIS — R413 Other amnesia: Secondary | ICD-10-CM | POA: Diagnosis not present

## 2019-08-01 DIAGNOSIS — I739 Peripheral vascular disease, unspecified: Secondary | ICD-10-CM | POA: Diagnosis not present

## 2019-08-01 DIAGNOSIS — J410 Simple chronic bronchitis: Secondary | ICD-10-CM | POA: Diagnosis not present

## 2019-08-01 DIAGNOSIS — G20 Parkinson's disease: Secondary | ICD-10-CM | POA: Diagnosis not present

## 2019-08-17 DIAGNOSIS — I70223 Atherosclerosis of native arteries of extremities with rest pain, bilateral legs: Secondary | ICD-10-CM | POA: Diagnosis not present

## 2019-08-17 DIAGNOSIS — I739 Peripheral vascular disease, unspecified: Secondary | ICD-10-CM | POA: Diagnosis not present

## 2019-08-24 DIAGNOSIS — I739 Peripheral vascular disease, unspecified: Secondary | ICD-10-CM | POA: Diagnosis not present

## 2019-08-29 DIAGNOSIS — N400 Enlarged prostate without lower urinary tract symptoms: Secondary | ICD-10-CM | POA: Diagnosis not present

## 2019-08-29 DIAGNOSIS — Z79899 Other long term (current) drug therapy: Secondary | ICD-10-CM | POA: Diagnosis not present

## 2019-08-29 DIAGNOSIS — F0280 Dementia in other diseases classified elsewhere without behavioral disturbance: Secondary | ICD-10-CM | POA: Diagnosis not present

## 2019-08-29 DIAGNOSIS — J449 Chronic obstructive pulmonary disease, unspecified: Secondary | ICD-10-CM | POA: Diagnosis not present

## 2019-08-29 DIAGNOSIS — I739 Peripheral vascular disease, unspecified: Secondary | ICD-10-CM | POA: Diagnosis not present

## 2019-08-29 DIAGNOSIS — G20 Parkinson's disease: Secondary | ICD-10-CM | POA: Diagnosis not present

## 2019-09-20 DIAGNOSIS — R350 Frequency of micturition: Secondary | ICD-10-CM | POA: Diagnosis not present

## 2019-09-20 DIAGNOSIS — I951 Orthostatic hypotension: Secondary | ICD-10-CM | POA: Diagnosis not present

## 2019-09-20 DIAGNOSIS — F5101 Primary insomnia: Secondary | ICD-10-CM | POA: Diagnosis not present

## 2019-09-20 DIAGNOSIS — I739 Peripheral vascular disease, unspecified: Secondary | ICD-10-CM | POA: Diagnosis not present

## 2020-05-08 DEATH — deceased
